# Patient Record
Sex: Female | Born: 1960 | Race: Black or African American | Hispanic: No | Marital: Single | Smoking: Former smoker
Health system: Southern US, Community
[De-identification: ages and names within clinical notes are randomized; demographics above are authoritative.]

## PROBLEM LIST (undated history)

## (undated) DIAGNOSIS — R7303 Prediabetes: Secondary | ICD-10-CM

## (undated) DIAGNOSIS — M1990 Unspecified osteoarthritis, unspecified site: Secondary | ICD-10-CM

## (undated) DIAGNOSIS — Z9289 Personal history of other medical treatment: Secondary | ICD-10-CM

## (undated) DIAGNOSIS — E049 Nontoxic goiter, unspecified: Secondary | ICD-10-CM

## (undated) DIAGNOSIS — E039 Hypothyroidism, unspecified: Secondary | ICD-10-CM

## (undated) DIAGNOSIS — I10 Essential (primary) hypertension: Secondary | ICD-10-CM

## (undated) DIAGNOSIS — K219 Gastro-esophageal reflux disease without esophagitis: Secondary | ICD-10-CM

## (undated) DIAGNOSIS — G4730 Sleep apnea, unspecified: Secondary | ICD-10-CM

## (undated) DIAGNOSIS — F419 Anxiety disorder, unspecified: Secondary | ICD-10-CM

## (undated) DIAGNOSIS — I499 Cardiac arrhythmia, unspecified: Secondary | ICD-10-CM

## (undated) DIAGNOSIS — R0600 Dyspnea, unspecified: Secondary | ICD-10-CM

## (undated) DIAGNOSIS — I509 Heart failure, unspecified: Secondary | ICD-10-CM

## (undated) DIAGNOSIS — E0590 Thyrotoxicosis, unspecified without thyrotoxic crisis or storm: Secondary | ICD-10-CM

## (undated) DIAGNOSIS — R011 Cardiac murmur, unspecified: Secondary | ICD-10-CM

## (undated) DIAGNOSIS — M069 Rheumatoid arthritis, unspecified: Secondary | ICD-10-CM

## (undated) DIAGNOSIS — Z789 Other specified health status: Secondary | ICD-10-CM

## (undated) HISTORY — DX: Cardiac arrhythmia, unspecified: I49.9

## (undated) HISTORY — DX: Rheumatoid arthritis, unspecified: M06.9

## (undated) HISTORY — PX: ENDOMETRIAL ABLATION: SHX621

---

## 2004-10-25 ENCOUNTER — Inpatient Hospital Stay: Payer: Self-pay | Admitting: Family Medicine

## 2005-01-07 ENCOUNTER — Ambulatory Visit: Payer: Self-pay

## 2006-03-25 ENCOUNTER — Ambulatory Visit: Payer: Self-pay | Admitting: Family Medicine

## 2006-10-24 ENCOUNTER — Emergency Department: Payer: Self-pay | Admitting: Emergency Medicine

## 2007-01-04 ENCOUNTER — Emergency Department: Payer: Self-pay | Admitting: Emergency Medicine

## 2007-11-06 ENCOUNTER — Observation Stay: Payer: Self-pay | Admitting: General Surgery

## 2007-11-10 ENCOUNTER — Emergency Department: Payer: Self-pay | Admitting: Emergency Medicine

## 2007-11-14 ENCOUNTER — Observation Stay: Payer: Self-pay | Admitting: General Surgery

## 2007-12-07 ENCOUNTER — Ambulatory Visit: Payer: Self-pay | Admitting: Unknown Physician Specialty

## 2008-01-03 ENCOUNTER — Ambulatory Visit: Payer: Self-pay | Admitting: Unknown Physician Specialty

## 2008-05-17 ENCOUNTER — Emergency Department: Payer: Self-pay | Admitting: Emergency Medicine

## 2008-06-21 ENCOUNTER — Emergency Department: Payer: Self-pay | Admitting: Emergency Medicine

## 2009-04-03 ENCOUNTER — Ambulatory Visit: Payer: Self-pay

## 2010-07-30 ENCOUNTER — Emergency Department: Payer: Self-pay | Admitting: Emergency Medicine

## 2012-09-05 ENCOUNTER — Emergency Department: Payer: Self-pay | Admitting: Emergency Medicine

## 2012-09-05 LAB — CK TOTAL AND CKMB (NOT AT ARMC)
CK, Total: 105 U/L (ref 21–215)
CK-MB: 1.2 ng/mL (ref 0.5–3.6)

## 2012-09-05 LAB — CBC
HGB: 14 g/dL (ref 12.0–16.0)
MCH: 30 pg (ref 26.0–34.0)
Platelet: 220 10*3/uL (ref 150–440)
RBC: 4.69 10*6/uL (ref 3.80–5.20)

## 2012-09-05 LAB — BASIC METABOLIC PANEL
Calcium, Total: 9.7 mg/dL (ref 8.5–10.1)
Chloride: 103 mmol/L (ref 98–107)
Co2: 27 mmol/L (ref 21–32)
EGFR (African American): >60
Osmolality: 272 (ref 275–301)
Sodium: 136 mmol/L (ref 136–145)

## 2012-09-05 LAB — TROPONIN I: Troponin-I: <0.02 ng/mL

## 2013-08-30 ENCOUNTER — Emergency Department: Payer: Self-pay | Admitting: Emergency Medicine

## 2013-08-30 ENCOUNTER — Ambulatory Visit: Payer: Self-pay

## 2013-10-25 ENCOUNTER — Ambulatory Visit: Payer: Self-pay

## 2014-12-02 ENCOUNTER — Emergency Department: Payer: Self-pay | Admitting: Emergency Medicine

## 2016-04-15 ENCOUNTER — Other Ambulatory Visit: Payer: Self-pay | Admitting: Family Medicine

## 2016-04-15 DIAGNOSIS — Z1239 Encounter for other screening for malignant neoplasm of breast: Secondary | ICD-10-CM

## 2016-06-13 ENCOUNTER — Encounter: Payer: Self-pay | Admitting: Emergency Medicine

## 2016-06-13 ENCOUNTER — Emergency Department: Payer: BLUE CROSS/BLUE SHIELD

## 2016-06-13 ENCOUNTER — Emergency Department
Admission: EM | Admit: 2016-06-13 | Discharge: 2016-06-13 | Disposition: A | Discharge status: Home / Self Care | Payer: BLUE CROSS/BLUE SHIELD | Attending: Emergency Medicine | Admitting: Emergency Medicine

## 2016-06-13 DIAGNOSIS — I10 Essential (primary) hypertension: Secondary | ICD-10-CM | POA: Insufficient documentation

## 2016-06-13 DIAGNOSIS — M79605 Pain in left leg: Secondary | ICD-10-CM | POA: Diagnosis present

## 2016-06-13 DIAGNOSIS — M1610 Unilateral primary osteoarthritis, unspecified hip: Secondary | ICD-10-CM

## 2016-06-13 DIAGNOSIS — E119 Type 2 diabetes mellitus without complications: Secondary | ICD-10-CM | POA: Diagnosis not present

## 2016-06-13 DIAGNOSIS — F17200 Nicotine dependence, unspecified, uncomplicated: Secondary | ICD-10-CM | POA: Diagnosis not present

## 2016-06-13 DIAGNOSIS — M169 Osteoarthritis of hip, unspecified: Secondary | ICD-10-CM | POA: Insufficient documentation

## 2016-06-13 HISTORY — DX: Essential (primary) hypertension: I10

## 2016-06-13 MED ORDER — TRAMADOL HCL 50 MG PO TABS
50.0000 mg | ORAL_TABLET | Freq: Four times a day (QID) | ORAL | 0 refills | Status: DC | PRN
Start: 2016-06-13 — End: 2017-04-01

## 2016-06-13 MED ORDER — MELOXICAM 15 MG PO TABS: 15.0000 mg | ORAL_TABLET | Freq: Every day | ORAL | 0 refills | Status: DC

## 2016-06-13 NOTE — ED Triage Notes (Signed)
Reports left hip and leg pain x 1 month.  Ambulates well to triage.

## 2016-06-13 NOTE — ED Notes (Signed)
Pt comes in to ED w/ c/o L thigh pain that radiates to hip and buttocks x1 month that has progressively increased, pain stated 10/10. Per pt pain makes walking difficult. Pt did see orthopedics 8/24 and was prescribed prednisone and muscle relaxer. NAD noted.

## 2016-06-13 NOTE — ED Provider Notes (Signed)
[facility] Emergency Department Provider Note   ____________________________________________   First MD Initiated Contact with Patient 06/13/16 1028     (approximate)  I have reviewed the triage vital signs and the nursing  HPI Alexandria Moore is a 55 y.o. female patient complaining of left thigh pain that radiates to the hip and buttocks times one month. Patient stated pain increases with walking. Patient saw her orthopedic doctor on 06/04/2016 and was prescribed prednisone and muscle relaxants. Patient state no relief with these medications. Patient states she's also been scheduled for physical therapy but has not started the program yet patient states she's discontinue the prednisone on the last ptosis secondary to stomach distress. Patient states she notified the orthopedic doctor about not finishing the last day the medication. Patient rates the pain as a 10 over 10. Patient described the pain as intermittent pain "achy" and sharp. No other palliative measures for this complaint.   Past Medical History:  Diagnosis Date  . Diabetes mellitus without complication (HCC)   . Hypertension     There are no active problems to display for this patient.   History reviewed. No pertinent surgical history.  Prior to Admission medications   Medication Sig Start Date End Date Taking? Authorizing Provider  meloxicam (MOBIC) 15 MG tablet Take 1 tablet (15 mg total) by mouth daily. 06/13/16   Joni Reining, PA-C  traMADol (ULTRAM) 50 MG tablet Take 1 tablet (50 mg total) by mouth every 6 (six) hours as needed. 06/13/16 06/13/17  Joni Reining, PA-C    Allergies Review of patient's allergies indicates no known allergies.  No family history on file.  Social History Social History  Substance Use Topics  . Smoking status: Current Every Day Smoker  . Smokeless tobacco: Never Used  . Alcohol use Not on file    Review of Systems Constitutional: No fever/chills Eyes:  No visual changes. ENT: No sore throat. Cardiovascular: Denies chest pain. Respiratory: Denies shortness of breath. Gastrointestinal: No abdominal pain.  No nausea, no vomiting.  No diarrhea.  No constipation. Genitourinary: Negative for dysuria. Musculoskeletal: Positive for left hip pain Skin: Negative for rash. Neurological: Negative for headaches, focal weakness or numbness.    ____________________________________________   PHYSICAL EXAM:  VITAL SIGNS: ED Triage Vitals  Enc Vitals Group     BP 06/13/16 1005 124/79     Pulse Rate 06/13/16 1005 66     Resp 06/13/16 1005 18     Temp 06/13/16 1005 98 F (36.7 C)     Temp src --      SpO2 06/13/16 1005 99 %     Weight 06/13/16 1007 300 lb (136.1 kg)     Height 06/13/16 1007 5\' 6"  (1.676 m)     Head Circumference --      Peak Flow --      Pain Score 06/13/16 1008 10     Pain Loc --      Pain Edu? --      Excl. in GC? --     Constitutional: Alert and oriented. Well appearing and in no acute distress.Obesity Eyes: Conjunctivae are normal. PERRL. EOMI. Head: Atraumatic. Nose: No congestion/rhinnorhea. Mouth/Throat: Mucous membranes are moist.  Oropharynx non-erythematous. Neck: No stridor.  No cervical spine tenderness to palpation. Hematological/Lymphatic/Immunilogical: No cervical lymphadenopathy. Cardiovascular: Normal rate, regular rhythm. Grossly normal heart sounds.  Good peripheral circulation. Respiratory: Normal respiratory effort.  No retractions. Lungs CTAB. Gastrointestinal: Soft and nontender. No distention. No abdominal  bruits. No CVA tenderness. Musculoskeletal: No obvious deformity to the left hip. No leg length discrepancy. Patient has moderate guarding palpation of the greater trochanter. Patient ambulates with a atypical gait fell favoring the left lower extremity.  Neurologic:  Normal speech and language. No gross focal neurologic deficits are appreciated. No gait instability. Skin:  Skin is warm, dry  and intact. No rash noted. Psychiatric: Mood and affect are normal. Speech and behavior are normal.  ____________________________________________   LABS (all labs ordered are listed, but only abnormal results are displayed)  Labs Reviewed - No data to display ____________________________________________  EKG   ____________________________________________  RADIOLOGY  X-rays showing age advanced degenerative changes bilaterally with the left greater than the right. There is significant joint space narrowing. ____________________________________________   PROCEDURES  Procedure(s) performed: None  Procedures  Critical Care performed: No  ____________________________________________   INITIAL IMPRESSION / ASSESSMENT A ND PLAN / ED COURSE  Pertinent labs & imaging results that were available during my care of the patient were reviewed by me and considered in my medical decision making (see chart for details).  Left hip pain secondary to advanced left hip joint degenerative changes.  Clinical Course     ____________________________________________   FINAL CLINICAL IMPRESSION(S) / ED DIAGNOSES  Final diagnoses:  DJD (degenerative joint disease) of pelvis   Left hip pain secondary to DJD. Discussed x-ray finding with patient. Patient will follow-up with orthopedic department. Definitive evaluation and treatment.   NEW MEDICATIONS STARTED DURING THIS VISIT:  New Prescriptions   MELOXICAM (MOBIC) 15 MG TABLET    Take 1 tablet (15 mg total) by mouth daily.   TRAMADOL (ULTRAM) 50 MG TABLET    Take 1 tablet (50 mg total) by mouth every 6 (six) hours as needed.     Note:  This document was prepared using Dragon voice recognition software and may include unintentional dictation errors.    Joni Reining, PA-C 06/13/16 1117    Governor Rooks, MD 06/13/16 1539

## 2016-10-09 ENCOUNTER — Other Ambulatory Visit: Payer: Self-pay | Admitting: Family Medicine

## 2016-10-09 DIAGNOSIS — Z1239 Encounter for other screening for malignant neoplasm of breast: Secondary | ICD-10-CM

## 2016-11-27 ENCOUNTER — Ambulatory Visit: Payer: BLUE CROSS/BLUE SHIELD

## 2016-12-21 ENCOUNTER — Ambulatory Visit: Payer: BLUE CROSS/BLUE SHIELD

## 2016-12-30 ENCOUNTER — Other Ambulatory Visit: Payer: Self-pay | Admitting: Student

## 2016-12-30 DIAGNOSIS — M1612 Unilateral primary osteoarthritis, left hip: Secondary | ICD-10-CM

## 2017-01-12 ENCOUNTER — Ambulatory Visit
Admission: RE | Admit: 2017-01-12 | Discharge: 2017-01-12 | Disposition: A | Discharge status: Home / Self Care | Payer: BLUE CROSS/BLUE SHIELD | Source: Ambulatory Visit | Attending: Student | Admitting: Student

## 2017-01-12 DIAGNOSIS — M1612 Unilateral primary osteoarthritis, left hip: Secondary | ICD-10-CM

## 2017-01-12 DIAGNOSIS — M79652 Pain in left thigh: Secondary | ICD-10-CM | POA: Insufficient documentation

## 2017-01-12 DIAGNOSIS — M160 Bilateral primary osteoarthritis of hip: Secondary | ICD-10-CM | POA: Diagnosis not present

## 2017-01-20 ENCOUNTER — Inpatient Hospital Stay: Admission: RE | Admit: 2017-01-20 | Payer: BLUE CROSS/BLUE SHIELD | Source: Ambulatory Visit

## 2017-03-05 ENCOUNTER — Ambulatory Visit: Payer: BLUE CROSS/BLUE SHIELD | Attending: Family Medicine

## 2017-04-01 ENCOUNTER — Encounter: Payer: Self-pay | Admitting: Emergency Medicine

## 2017-04-01 ENCOUNTER — Emergency Department
Admission: EM | Admit: 2017-04-01 | Discharge: 2017-04-01 | Disposition: A | Discharge status: Home / Self Care | Payer: BLUE CROSS/BLUE SHIELD | Attending: Emergency Medicine | Admitting: Emergency Medicine

## 2017-04-01 ENCOUNTER — Emergency Department: Payer: BLUE CROSS/BLUE SHIELD

## 2017-04-01 DIAGNOSIS — I10 Essential (primary) hypertension: Secondary | ICD-10-CM | POA: Insufficient documentation

## 2017-04-01 DIAGNOSIS — Z79899 Other long term (current) drug therapy: Secondary | ICD-10-CM | POA: Insufficient documentation

## 2017-04-01 DIAGNOSIS — M19012 Primary osteoarthritis, left shoulder: Secondary | ICD-10-CM | POA: Insufficient documentation

## 2017-04-01 DIAGNOSIS — F17200 Nicotine dependence, unspecified, uncomplicated: Secondary | ICD-10-CM | POA: Insufficient documentation

## 2017-04-01 DIAGNOSIS — E119 Type 2 diabetes mellitus without complications: Secondary | ICD-10-CM | POA: Insufficient documentation

## 2017-04-01 MED ORDER — INDOMETHACIN 25 MG PO CAPS: 25.0000 mg | ORAL_CAPSULE | Freq: Two times a day (BID) | ORAL | 0 refills | Status: DC

## 2017-04-01 NOTE — ED Provider Notes (Signed)
[facility] Emergency Department Provider Note   ____________________________________________   First MD Initiated Contact with Patient 04/01/17 [phone]     (approximate)  I have reviewed the triage vital signs and the nursing notes.   HISTORY  Chief Complaint Shoulder Pain    HPI Alexandria Moore is a 56 y.o. female is here with complaint of left shoulder pain. Patient states that her shoulder and arm have been "sore" for approximately 2 weeks. She denies any injury. She denies any shortness of breath or chest pain. She states that pain is worse with range of motion. She has taken some over-the-counter medication infrequently without any relief of her pain. She denies any previous injury to her shoulder. Currently she rates her pain as a 8/10.   Past Medical History:  Diagnosis Date  . Diabetes mellitus without complication (HCC)   . Hypertension     There are no active problems to display for this patient.   History reviewed. No pertinent surgical history.  Prior to Admission medications   Medication Sig Start Date End Date Taking? Authorizing Provider  amLODipine (NORVASC) 5 MG tablet Take 10 mg by mouth daily.   Yes [provider]  carvedilol (COREG) 25 MG tablet Take 25 mg by mouth 2 (two) times daily with a meal.   Yes [provider]  cloNIDine (CATAPRES) 0.1 MG tablet Take 0.1 mg by mouth 2 (two) times daily.   Yes [provider]  losartan (COZAAR) 25 MG tablet Take 100 mg by mouth daily.   Yes [provider]  indomethacin (INDOCIN) 25 MG capsule Take 1 capsule (25 mg total) by mouth 2 (two) times daily with a meal. 04/01/17   Tommi Rumps, PA-C    Allergies Patient has no known allergies.  No family history on file.  Social History Social History  Substance Use Topics  . Smoking status: Current Every Day Smoker  . Smokeless tobacco: Never Used  . Alcohol use Not on file    Review of  Systems Constitutional: No fever/chills Cardiovascular: Denies chest pain. Respiratory: Denies shortness of breath. Musculoskeletal: Negative for back pain.Positive left shoulder pain. Skin: Negative for rash. Neurological: Negative for headaches, focal weakness or numbness.   ____________________________________________   PHYSICAL EXAM:  VITAL SIGNS: ED Triage Vitals  Enc Vitals Group     BP 04/01/17 0900 (!) 185/97     Pulse Rate 04/01/17 0900 65     Resp 04/01/17 0900 20     Temp --      Temp Source 04/01/17 0900 Oral     SpO2 04/01/17 0900 100 %     Weight 04/01/17 0902 240 lb (108.9 kg)     Height 04/01/17 0902 5\' 4"  (1.626 m)     Head Circumference --      Peak Flow --      Pain Score 04/01/17 0902 8     Pain Loc --      Pain Edu? --      Excl. in GC? --     Constitutional: Alert and oriented. Well appearing and in no acute distress. Eyes: Conjunctivae are normal. PERRL. EOMI. Head: Atraumatic. Neck: No stridor.  No tenderness on palpation cervical spine posteriorly. Range of motion is without restriction. Cardiovascular: Normal rate, regular rhythm. Grossly normal heart sounds.  Good peripheral circulation. Respiratory: Normal respiratory effort.  No retractions. Lungs CTAB. Musculoskeletal: Trapezius muscles on the left shoulder are moderately tender to palpation. There is no gross  deformity and no soft tissue swelling. There is no evidence of injury such as ecchymosis or abrasions. Range of motion of left shoulder is without crepitus. There is no difficulty with range of motion in all 4 planes. Neurologic:  Normal speech and language. No gross focal neurologic deficits are appreciated. No gait instability. Skin:  Skin is warm, dry and intact. No rash noted. Psychiatric: Mood and affect are normal. Speech and behavior are normal.  ____________________________________________   LABS (all labs ordered are listed, but only abnormal results are displayed)  Labs  Reviewed - No data to display  RADIOLOGY  Dg Shoulder Left  Result Date: 04/01/2017 CLINICAL DATA:  Shoulder pain.  No known injury . EXAM: LEFT SHOULDER - 2+ VIEW COMPARISON:  No prior . FINDINGS: Mild acromioclavicular separation cannot be excluded. Acromioclavicular and glenohumeral degenerative change. No acute bony abnormality identified. IMPRESSION: 1. Mild acromioclavicular separation cannot be excluded. 2. Acromioclavicular and glenohumeral degenerative change. Electronically Signed   By: Maisie Fus  Register   On: 04/01/2017 09:59    ____________________________________________   PROCEDURES  Procedure(s) performed: None  Procedures  Critical Care performed: No  ____________________________________________   INITIAL IMPRESSION / ASSESSMENT AND PLAN / ED COURSE  Pertinent labs & imaging results that were available during my care of the patient were reviewed by me and considered in my medical decision making (see chart for details).  Patient has been taking over-the-counter medication and frequently without any relief. Patient was given a prescription for Indocin 25 mg 1 twice a day with food. She may also use ice or heat to her shoulder as needed for discomfort. She is to follow-up with her PCP if any continued problems.   ____________________________________________   FINAL CLINICAL IMPRESSION(S) / ED DIAGNOSES  Final diagnoses:  Osteoarthritis of left shoulder, unspecified osteoarthritis type      NEW MEDICATIONS STARTED DURING THIS VISIT:  Discharge Medication List as of 04/01/2017 10:14 AM    START taking these medications   Details  indomethacin (INDOCIN) 25 MG capsule Take 1 capsule (25 mg total) by mouth 2 (two) times daily with a meal., Starting Thu 04/01/2017, Print         Note:  This document was prepared using Dragon voice recognition software and may include unintentional dictation errors.    Tommi Rumps, PA-C 04/01/17 1444    Governor Rooks, MD 04/01/17 [phone]

## 2017-04-01 NOTE — Discharge Instructions (Signed)
Follow-up with your  primary care provider for recheck of your blood pressure. Continue taking your blood pressure medication daily. Begin taking indomethacin 25 mg twice a day with food. You  may also use ice or heat to your shoulder as needed for comfort.

## 2017-04-01 NOTE — ED Triage Notes (Signed)
presents with pain to left shoulder and arm pain /soreness for about 2 weeks  Denies any injury  No SOB or chest discomfort  No deformity noted

## 2017-08-24 ENCOUNTER — Other Ambulatory Visit: Payer: Self-pay | Admitting: Family Medicine

## 2017-08-24 DIAGNOSIS — Z1231 Encounter for screening mammogram for malignant neoplasm of breast: Secondary | ICD-10-CM

## 2018-02-09 ENCOUNTER — Ambulatory Visit: Payer: 59

## 2018-02-24 ENCOUNTER — Ambulatory Visit: Payer: 59 | Attending: Sports Medicine

## 2018-02-24 ENCOUNTER — Other Ambulatory Visit: Payer: Self-pay

## 2018-02-24 DIAGNOSIS — R2689 Other abnormalities of gait and mobility: Secondary | ICD-10-CM | POA: Insufficient documentation

## 2018-02-24 DIAGNOSIS — M25552 Pain in left hip: Secondary | ICD-10-CM | POA: Insufficient documentation

## 2018-02-24 DIAGNOSIS — M25652 Stiffness of left hip, not elsewhere classified: Secondary | ICD-10-CM | POA: Insufficient documentation

## 2018-02-24 NOTE — Therapy (Addendum)
[facility] MAIN [facility] SERVICES [address] [place], [place], [address] Phone: [phone]   Fax:  [phone]  Physical Therapy Evaluation  Patient Details  Name: Alexandria Moore MRN: [id number] Date of Birth: 23-Feb-1961 Referring Provider: Dorthula Nettles   Encounter Date: 02/24/2018  PT End of Session - 02/25/18 0943    Visit Number  1    Number of Visits  8    Date for PT Re-Evaluation  04/21/18    Authorization - Visit Number  1    Authorization - Number of Visits  10    PT Start Time  1015    PT Stop Time  1106    PT Time Calculation (min)  51 min    Equipment Utilized During Treatment  Gait belt    Activity Tolerance  Patient tolerated treatment well;Patient limited by pain    Behavior During Therapy  [facility] for tasks assessed/performed       Past Medical History:  Diagnosis Date  . Diabetes mellitus without complication (HCC)   . Hypertension    History reviewed. No pertinent surgical history.  There were no vitals filed for this visit.  Subjective Assessment - 03/02/18 1100    Subjective  Patient is a pleasant 57 year old woman who presents for L hip pain.    Pertinent History  Patient is a pleasant 57 year old female who presents to clinic for left hip OA. Patient has had three injections with the last being 4-5 months ago. Pain recently coming back, Now getting cramping on anterior portion of thigh for the past few months. The left hip pain began approximately a year ago, gradually getting worse. Pain hurts every time she walks, leg now gives out. Patient is under high stress from caregiver role to both mother and daughter at this time.     Limitations  Sitting;Lifting;Standing;Walking;House hold activities    How long can you sit comfortably?  n/a    How long can you stand comfortably?  not painful    How long can you walk comfortably?  painful immediately upon standing    Diagnostic tests  Imaging: OA Severe, advanced for  age left hip osteoarthritis. Mild to moderate    Patient Stated Goals  reduce pain, improve walking and transfers    Currently in Pain?  Yes    Pain Score  8     Pain Location  Hip    Pain Orientation  Left    Pain Descriptors / Indicators  Cramping    Pain Type  Chronic pain    Pain Onset  More than a month ago    Pain Frequency  Intermittent    Aggravating Factors   transferring, walking    Pain Relieving Factors  injection        Worst pain: 8-9/10 sharp/bad/cramp  Goal: return to dancing    5x STS =13.5 seconds 10MWT=8.5 seconds LEFS= 20/80  Posture: anterior pelvic tilt with excessive lumbar lordosis  Gait: coxalgic but fluid with trendelenberg  ST: + SLR and FAIR  15x86 ft =1290 ft [facility] PT Assessment - 02/25/18 0001      Assessment   Medical Diagnosis  L hip pain     Referring Provider  Dorthula Nettles    Onset Date/Surgical Date  -- >1 year    Hand Dominance  Right    Next MD Visit  not sure    Prior Therapy  no       Precautions  Precautions  None      Restrictions   Weight Bearing Restrictions  No      Balance Screen   Has the patient fallen in the past 6 months  No    Has the patient had a decrease in activity level because of a fear of falling?   Yes    Is the patient reluctant to leave their home because of a fear of falling?   No      Home Environment   Living Environment  Private residence    Living Arrangements  Alone    Available Help at Discharge  Family    Type of Home  Mobile home    Home Access  Stairs to enter    Entrance Stairs-Number of Steps  4    Entrance Stairs-Rails  Left;Right;Can reach both    Home Layout  One level      Prior Function   Level of Independence  Independent    Vocation  Full time employment    Vocation Requirements  walk all day, drive life coach    Leisure  dance, music       Cognition   Overall Cognitive Status  Within Functional Limits for tasks assessed      Observation/Other Assessments    Observations  decreased weight shift onto L hip    Other Surveys   Other Surveys    Lower Extremity Functional Scale   20/80      Sensation   Light Touch  Appears Intact      Coordination   Gross Motor Movements are Fluid and Coordinated  No    Heel Shin Test  LLE unable to obtain position      Posture/Postural Control   Posture/Postural Control  Postural limitations    Postural Limitations  Anterior pelvic tilt;Increased lumbar lordosis;Weight shift right      ROM / Strength   AROM / PROM / Strength  AROM;PROM;Strength      AROM   Overall AROM   Deficits    AROM Assessment Site  Hip    Right/Left Hip  Left    Left Hip Extension  4    Left Hip Flexion  112    Left Hip External Rotation   29    Left Hip Internal Rotation   15    Left Hip ABduction  -- WFL    Left Hip ADduction  11      PROM   Overall PROM   Deficits    Overall PROM Comments  decreased hip extension. everything else [facility]      Strength   Overall Strength  Deficits    Strength Assessment Site  Hip;Knee;Ankle    Right/Left Hip  Left;Right    Right Hip Flexion  5/5    Right Hip Extension  4/5    Right Hip ABduction  4/5    Right Hip ADduction  4/5    Left Hip Flexion  3+/5 pain    Left Hip Extension  2+/5    Left Hip ABduction  4/5    Left Hip ADduction  4/5    Right/Left Knee  Right;Left    Right Knee Flexion  5/5    Right Knee Extension  5/5    Left Knee Flexion  4-/5    Left Knee Extension  4-/5 pain    Right/Left Ankle  Right;Left    Right Ankle Dorsiflexion  5/5    Right Ankle Plantar Flexion  5/5  Left Ankle Dorsiflexion  5/5    Left Ankle Plantar Flexion  5/5      Flexibility   Soft Tissue Assessment /Muscle Length  yes    Quadriceps  L tight and painful      Palpation   Palpation comment  Hip PA mobilizations hypomobile nonpainful, AP painful w hard EF      Special Tests    Special Tests  Hip Special Tests    Other special tests  -- - FABER, +FAIR, -Axial hip Load, + SLR      Bed  Mobility   Bed Mobility  Rolling Right;Rolling Left;Supine to Sit;Sit to Supine    Rolling Right  7: Independent    Rolling Left  7: Independent    Supine to Sit  7: Independent    Sit to Supine  7: Independent      Transfers   Transfers  Sit to Stand;Stand to Sit    Sit to Stand  6: Modified independent (Device/Increase time)    Five time sit to stand comments   13.5 sec, decreased weight shift to LLE    Stand to Sit  6: Modified independent (Device/Increase time)      Ambulation/Gait   Ambulation/Gait  Yes    Ambulation/Gait Assistance  7: Independent    Ambulation Distance (Feet)  1290 Feet    Assistive device  None    Gait Pattern  Decreased stance time - left;Decreased weight shift to left;Trendelenburg    Ambulation Surface  Level;Indoor    Gait velocity  1.25m/s      6 minute walk test results    Aerobic Endurance Distance Walked  1290         Access Code: [email or web address]  URL: [email or web address]  Date: 02/25/2018  Prepared by: Precious Bard   Exercises  Supine Hip Flexor Stretch with Weight - 2 reps - 2 sets - 30 hold - 1x daily - 7x weekly  Standing Hip Flexor Stretch - 2 reps - 2 sets - 30 hold - 1x daily - 7x weekly  Standing Hip Extension - 10 reps - 2 sets - 5 hold - 1x daily - 7x weekly  Standing Knee Flexion - 10 reps - 2 sets - 5 hold - 1x daily - 7x weekly  Standing Knee Flexion - 10 reps - 2 sets - 5 hold - 1x daily - 7x weekly        Objective measurements completed on examination: See above findings.       Patient will benefit from skilled therapeutic intervention in order to improve the following deficits and impairments:  Abnormal gait, Decreased activity tolerance, Decreased coordination, Decreased endurance, Decreased range of motion, Decreased mobility, Decreased strength, Difficulty walking, Hypomobility, Impaired flexibility, Impaired perceived functional ability, Improper body mechanics, Postural dysfunction, Pain  Visit  Diagnosis: Pain in left hip  Stiffness of left hip, not elsewhere classified  Other abnormalities of gait and mobility   Plan - 03/02/18 1108    Clinical Impression Statement  Patient is a pleasant 57 year old woman who presents for L hip pain. Tight anterior musculature (especially rectus femoris) results in spasming pain with ambulation and decreases with gentle prolonged stretching. Decreased extensor musculature strength noted with minimal available range of motion. Noted anterior pelvic tilt with increased lumbar lordosis combined with decreased weight shift onto LLE. 5x STS= 13.5 seconds with decreased weight shift to LLE, 10 MWT=8.5 seconds, LEFS 20/80, 6 min walk test 1290 ft. Positive SLR and FAIR test potentially  due to tight rectus femoris resulting in pain upon contraction and lengthening. Trendelenburg gait with decreased hip extension of LLE noted with ambulation, initially worse, improves as movement continues then peaks and begins to regress as fatigue sets in. Patient will benefit from skilled physical therapy to decrease pain and improve mobility to return to PLOF.     History and Personal Factors relevant to plan of care:  This patient presents with, 3, personal factors/ comorbidities and 1-2 body elements including body structures and functions, activity limitations and or participation restrictions. Patient's condition is evolving.     Clinical Presentation  Evolving    Clinical Presentation due to:  pain initially in hip, progressing to anterior portion of thigh in past 3 months    Rehab Potential  Fair    Clinical Impairments Affecting Rehab Potential  (+) motivation, age, recent weight loss (-) high stress levels in personal life, chronicity of hip pain    PT Frequency  1x / week    PT Duration  8 weeks    PT Treatment/Interventions  ADLs/Self Care Home Management;Cryotherapy;Aquatic Therapy;Electrical Stimulation;Iontophoresis 4mg /ml Dexamethasone;Moist  Heat;Ultrasound;Traction;Functional mobility training;Stair training;Gait training;DME Instruction;Therapeutic activities;Therapeutic exercise;Balance training;Neuromuscular re-education;Patient/family education;Manual techniques;Passive range of motion;Taping;Energy conservation;Dry needling    PT Next Visit Plan  review HEP, stretch quads, strenghten extensors, distraction of LLE     PT Home Exercise Plan  see sheet    Consulted and Agree with Plan of Care  Patient        Problem List There are no active problems to display for this patient. , PT, DPT   02/25/2018, 10:00 AM   [facility] MAIN [facility] SERVICES [address] [place], [place], [place] Phone: [phone]   Fax:  [phone]  Name: Alexandria Moore MRN: [id number] Date of Birth: [birth year]

## 2018-02-25 NOTE — Patient Instructions (Signed)
Access Code: [email or web address]  URL: [email or web address]  Date: 02/25/2018  Prepared by: Precious Bard   Exercises  Supine Hip Flexor Stretch with Weight - 2 reps - 2 sets - 30 hold - 1x daily - 7x weekly  Standing Hip Flexor Stretch - 2 reps - 2 sets - 30 hold - 1x daily - 7x weekly  Standing Hip Extension - 10 reps - 2 sets - 5 hold - 1x daily - 7x weekly  Standing Knee Flexion - 10 reps - 2 sets - 5 hold - 1x daily - 7x weekly  Standing Knee Flexion - 10 reps - 2 sets - 5 hold - 1x daily - 7x weekly

## 2018-03-08 ENCOUNTER — Ambulatory Visit: Payer: 59

## 2018-03-15 ENCOUNTER — Ambulatory Visit: Payer: 59 | Attending: Sports Medicine

## 2018-03-22 ENCOUNTER — Ambulatory Visit: Payer: 59

## 2018-04-20 DIAGNOSIS — Z6841 Body Mass Index (BMI) 40.0 and over, adult: Secondary | ICD-10-CM | POA: Insufficient documentation

## 2018-05-04 ENCOUNTER — Encounter
Admission: RE | Admit: 2018-05-04 | Discharge: 2018-05-04 | Disposition: A | Discharge status: Home / Self Care | Payer: 59 | Source: Ambulatory Visit | Attending: Orthopedic Surgery | Admitting: Orthopedic Surgery

## 2018-05-04 ENCOUNTER — Other Ambulatory Visit: Payer: Self-pay

## 2018-05-04 DIAGNOSIS — R9431 Abnormal electrocardiogram [ECG] [EKG]: Secondary | ICD-10-CM | POA: Diagnosis not present

## 2018-05-04 DIAGNOSIS — Z01812 Encounter for preprocedural laboratory examination: Secondary | ICD-10-CM | POA: Insufficient documentation

## 2018-05-04 DIAGNOSIS — Z01818 Encounter for other preprocedural examination: Secondary | ICD-10-CM | POA: Insufficient documentation

## 2018-05-04 DIAGNOSIS — Z0183 Encounter for blood typing: Secondary | ICD-10-CM | POA: Insufficient documentation

## 2018-05-04 DIAGNOSIS — I10 Essential (primary) hypertension: Secondary | ICD-10-CM | POA: Insufficient documentation

## 2018-05-04 HISTORY — DX: Personal history of other medical treatment: Z92.89

## 2018-05-04 HISTORY — DX: Dyspnea, unspecified: R06.00

## 2018-05-04 HISTORY — DX: Cardiac murmur, unspecified: R01.1

## 2018-05-04 HISTORY — DX: Heart failure, unspecified: I50.9

## 2018-05-04 HISTORY — DX: Sleep apnea, unspecified: G47.30

## 2018-05-04 HISTORY — DX: Gastro-esophageal reflux disease without esophagitis: K21.9

## 2018-05-04 HISTORY — DX: Unspecified osteoarthritis, unspecified site: M19.90

## 2018-05-04 HISTORY — DX: Hypothyroidism, unspecified: E03.9

## 2018-05-04 LAB — SURGICAL PCR SCREEN
MRSA, PCR: NEGATIVE
Staphylococcus aureus: POSITIVE — AB

## 2018-05-04 LAB — BASIC METABOLIC PANEL
ANION GAP: 6 (ref 5–15)
BUN: 26 mg/dL — ABNORMAL HIGH (ref 6–20)
CALCIUM: 9.3 mg/dL (ref 8.9–10.3)
CO2: 26 mmol/L (ref 22–32)
Chloride: 107 mmol/L (ref 98–111)
Creatinine, Ser: 1 mg/dL (ref 0.44–1.00)
GFR calc Af Amer: >60 mL/min (ref >60)
GLUCOSE: 105 mg/dL — AB (ref 70–99)
POTASSIUM: 3.7 mmol/L (ref 3.5–5.1)
SODIUM: 139 mmol/L (ref 135–145)

## 2018-05-04 LAB — CBC WITH DIFFERENTIAL/PLATELET
BASOS ABS: 0 10*3/uL (ref 0–0.1)
BASOS PCT: 1 %
EOS ABS: 0.2 10*3/uL (ref 0–0.7)
EOS PCT: 3 %
HCT: 37.3 % (ref 35.0–47.0)
Hemoglobin: 12.7 g/dL (ref 12.0–16.0)
LYMPHS PCT: 17 %
Lymphs Abs: 1 10*3/uL (ref 1.0–3.6)
MCH: 31.9 pg (ref 26.0–34.0)
MCHC: 34.1 g/dL (ref 32.0–36.0)
MCV: 93.4 fL (ref 80.0–100.0)
Monocytes Absolute: 0.4 10*3/uL (ref 0.2–0.9)
Monocytes Relative: 7 %
Neutro Abs: 4.2 10*3/uL (ref 1.4–6.5)
Neutrophils Relative %: 72 %
PLATELETS: 245 10*3/uL (ref 150–440)
RBC: 3.99 MIL/uL (ref 3.80–5.20)
RDW: 15.4 % — ABNORMAL HIGH (ref 11.5–14.5)
WBC: 5.7 10*3/uL (ref 3.6–11.0)

## 2018-05-04 LAB — URINALYSIS, ROUTINE W REFLEX MICROSCOPIC
BILIRUBIN URINE: NEGATIVE
GLUCOSE, UA: NEGATIVE mg/dL
HGB URINE DIPSTICK: NEGATIVE
KETONES UR: NEGATIVE mg/dL
Leukocytes, UA: NEGATIVE
Nitrite: NEGATIVE
PROTEIN: NEGATIVE mg/dL
Specific Gravity, Urine: 1.025 (ref 1.005–1.030)
pH: 5 (ref 5.0–8.0)

## 2018-05-04 LAB — APTT: aPTT: 30 seconds (ref 24–36)

## 2018-05-04 LAB — SEDIMENTATION RATE: Sed Rate: 2 mm/hr (ref 0–30)

## 2018-05-04 LAB — PROTIME-INR
INR: 0.94
Prothrombin Time: 12.5 seconds (ref 11.4–15.2)

## 2018-05-04 LAB — TYPE AND SCREEN
ABO/RH(D): A POS
ANTIBODY SCREEN: NEGATIVE

## 2018-05-04 NOTE — Pre-Procedure Instructions (Addendum)
FAXED POSITIVE STAPH TO DR [facility] EKG COMPARED WITH 2016

## 2018-05-04 NOTE — Patient Instructions (Signed)
Your procedure is scheduled on: Thursday, May 12, 2018  Report to THE SECOND FLOOR OF THE SAME DAY SURGERY IN THE MEDICAL MALL  DO NOT STOP ON THE FIRST FLOOR TO REGISTER  To find out your arrival time please call [phone] between 1PM - 3PM on Wednesday, May 11, 2018  Remember: Instructions that are not followed completely may result in serious medical risk,  up to and including death, or upon the discretion of your surgeon and anesthesiologist your  surgery may need to be rescheduled.     _X__ 1. Do not eat food after midnight the night before your procedure.                 No gum chewing or hard candies. NOTHING SOLID IN YOUR MOUTH AFTER MIDNIGHT                  You may drink clear liquids up to 2 hours before you are scheduled to arrive for your surgery-                   DO not drink clear liquids within 2 hours of the start of your surgery.                  Clear Liquids include:  water, apple juice without pulp, clear carbohydrate                 drink such as Clearfast of Gatorade, Black Coffee or Tea (Do not add                 anything to coffee or tea).  __X__2.  On the morning of surgery brush your teeth with toothpaste and water,                    You may rinse your mouth with mouthwash if you wish.                       Do not swallow any toothpaste of mouthwash.     _X__ 3.  No Alcohol for 24 hours before or after surgery.   _X__ 4.  Do Not Smoke or use e-cigarettes For 24 Hours Prior to Your Surgery.                 Do not use any chewable tobacco products for at least 6 hours prior to                 surgery.  ____  5.  Bring all medications with you on the day of surgery if instructed.   __X__  6.  Notify your doctor if there is any change in your medical condition      (cold, fever, infections).     Do not wear jewelry, make-up, hairpins, clips or nail polish. Do not wear lotions, powders, or perfumes. You may wear  deodorant. Do not shave 48 hours prior to surgery. Men may shave face and neck. Do not bring valuables to the hospital.    [facility] is not responsible for any belongings or valuables.  Contacts, dentures or bridgework may not be worn into surgery. Leave your suitcase in the car. After surgery it may be brought to your room. For patients admitted to the hospital, discharge time is determined by your treatment team.   Patients discharged the day of surgery will not be allowed to drive home.   Please read over the  following fact sheets that you were given:                      PREPARING FOR SURGERY                      MRSA:STOP THE SPREAD   _X___ Take these medicines the morning of surgery with A SIP OF WATER:    1. AMLODIPINE  2. CLONIDINE  3. OMEPRAZOLE, IF YOU HAVE SOME  4. PERCOCET, IF NEED TO TAKE YOU CAN  5.  6.  ____ Fleet Enema (as directed)   __X__ Use CHG Soap as directed  _X___ Stop ALL ASPIRIN PRODUCTS TODAY               THIS INCLUDES EXCEDRIN / BC POWDERS / GOODIES POWDERS  __X__ Stop Anti-inflammatories AS OF TODAY               THIS INCLUDES IBUPROFEN / MOTRIN / ADVIL / ALEVE               YOU MAY TAKE TYLENOL AT ANY TIME PRIOR TO SURGERY  ____ Stop supplements until after surgery.    ____ Bring C-Pap to the hospital.   HAVE ANY TYPE OF STOOL SOFTENER TO USE AT HOME WHILE TAKING NARCOTICS.  WEAR STABLE SHOES WITH YOU FOR THE HOSPITAL STAY.   DO NOT TAKE LOSARTAN ON THE DAY OF SURGERY BUT DO CONTINUE TO TAKE IT UP UNTIL THEN

## 2018-05-05 LAB — URINE CULTURE: CULTURE: NO GROWTH

## 2018-05-11 MED ORDER — CEFAZOLIN SODIUM-DEXTROSE 2-4 GM/100ML-% IV SOLN
2.0000 g | Freq: Once | INTRAVENOUS | Status: AC
  Administered 2018-05-12: 2 g via INTRAVENOUS

## 2018-05-11 MED ORDER — TRANEXAMIC ACID 1000 MG/10ML IV SOLN
1000.0000 mg | INTRAVENOUS | Status: AC
  Administered 2018-05-12: 1000 mg via INTRAVENOUS
  Filled 2018-05-11: qty 1100

## 2018-05-12 ENCOUNTER — Other Ambulatory Visit: Payer: Self-pay

## 2018-05-12 ENCOUNTER — Encounter
Admission: RE | Disposition: A | Discharge status: Home Health | Payer: Self-pay | Source: Home / Self Care | Attending: Orthopedic Surgery

## 2018-05-12 ENCOUNTER — Inpatient Hospital Stay: Payer: Commercial Managed Care - HMO

## 2018-05-12 ENCOUNTER — Inpatient Hospital Stay
Admission: RE | Admit: 2018-05-12 | Discharge: 2018-05-14 | DRG: 470 | Disposition: A | Discharge status: Home Health | Payer: Commercial Managed Care - HMO | Attending: Orthopedic Surgery | Admitting: Orthopedic Surgery

## 2018-05-12 ENCOUNTER — Encounter: Payer: Self-pay | Admitting: *Deleted

## 2018-05-12 DIAGNOSIS — Z6841 Body Mass Index (BMI) 40.0 and over, adult: Secondary | ICD-10-CM

## 2018-05-12 DIAGNOSIS — Z7982 Long term (current) use of aspirin: Secondary | ICD-10-CM | POA: Diagnosis not present

## 2018-05-12 DIAGNOSIS — I509 Heart failure, unspecified: Secondary | ICD-10-CM | POA: Diagnosis present

## 2018-05-12 DIAGNOSIS — G8918 Other acute postprocedural pain: Secondary | ICD-10-CM

## 2018-05-12 DIAGNOSIS — Z419 Encounter for procedure for purposes other than remedying health state, unspecified: Secondary | ICD-10-CM

## 2018-05-12 DIAGNOSIS — Z87891 Personal history of nicotine dependence: Secondary | ICD-10-CM | POA: Diagnosis not present

## 2018-05-12 DIAGNOSIS — I110 Hypertensive heart disease with heart failure: Secondary | ICD-10-CM | POA: Diagnosis present

## 2018-05-12 DIAGNOSIS — M1612 Unilateral primary osteoarthritis, left hip: Principal | ICD-10-CM | POA: Diagnosis present

## 2018-05-12 DIAGNOSIS — M25552 Pain in left hip: Secondary | ICD-10-CM | POA: Diagnosis present

## 2018-05-12 DIAGNOSIS — D62 Acute posthemorrhagic anemia: Secondary | ICD-10-CM | POA: Diagnosis not present

## 2018-05-12 DIAGNOSIS — K219 Gastro-esophageal reflux disease without esophagitis: Secondary | ICD-10-CM | POA: Diagnosis present

## 2018-05-12 DIAGNOSIS — Z96642 Presence of left artificial hip joint: Secondary | ICD-10-CM

## 2018-05-12 HISTORY — PX: TOTAL HIP ARTHROPLASTY: SHX124

## 2018-05-12 LAB — ABO/RH: ABO/RH(D): A POS

## 2018-05-12 SURGERY — ARTHROPLASTY, HIP, TOTAL, ANTERIOR APPROACH
Anesthesia: Spinal | Site: Hip | Laterality: Left | Wound class: Clean

## 2018-05-12 MED ORDER — ACETAMINOPHEN 10 MG/ML IV SOLN
INTRAVENOUS | Status: DC | PRN
  Administered 2018-05-12: 1000 mg via INTRAVENOUS

## 2018-05-12 MED ORDER — METHOCARBAMOL 1000 MG/10ML IJ SOLN
500.0000 mg | Freq: Four times a day (QID) | INTRAVENOUS | Status: DC | PRN
  Filled 2018-05-12: qty 5

## 2018-05-12 MED ORDER — EPHEDRINE SULFATE 50 MG/ML IJ SOLN
INTRAMUSCULAR | Status: DC | PRN
  Administered 2018-05-12: 5 mg via INTRAVENOUS

## 2018-05-12 MED ORDER — FENTANYL CITRATE (PF) 100 MCG/2ML IJ SOLN
25.0000 ug | INTRAMUSCULAR | Status: DC | PRN
  Administered 2018-05-12: 25 ug via INTRAVENOUS

## 2018-05-12 MED ORDER — BUPIVACAINE HCL (PF) 0.5 % IJ SOLN
INTRAMUSCULAR | Status: DC | PRN
  Administered 2018-05-12: 3 mL via INTRATHECAL

## 2018-05-12 MED ORDER — EPHEDRINE SULFATE 50 MG/ML IJ SOLN
INTRAMUSCULAR | Status: AC
  Filled 2018-05-12: qty 1

## 2018-05-12 MED ORDER — ACETAMINOPHEN 10 MG/ML IV SOLN
INTRAVENOUS | Status: AC
  Filled 2018-05-12: qty 100

## 2018-05-12 MED ORDER — METOCLOPRAMIDE HCL 10 MG PO TABS: 5.0000 mg | ORAL_TABLET | Freq: Three times a day (TID) | ORAL | Status: DC | PRN

## 2018-05-12 MED ORDER — SODIUM CHLORIDE 0.9 % IV SOLN
INTRAVENOUS | Status: DC | PRN
  Administered 2018-05-12: 20 ug/min via INTRAVENOUS

## 2018-05-12 MED ORDER — MORPHINE SULFATE (PF) 2 MG/ML IV SOLN
2.0000 mg | INTRAVENOUS | Status: DC | PRN
  Administered 2018-05-12 (×2): 2 mg via INTRAVENOUS
  Filled 2018-05-12 (×2): qty 1

## 2018-05-12 MED ORDER — PANTOPRAZOLE SODIUM 40 MG PO TBEC
40.0000 mg | DELAYED_RELEASE_TABLET | Freq: Every day | ORAL | Status: DC
  Administered 2018-05-13 – 2018-05-14 (×2): 40 mg via ORAL
  Filled 2018-05-12 (×2): qty 1

## 2018-05-12 MED ORDER — ALUM & MAG HYDROXIDE-SIMETH 200-200-20 MG/5ML PO SUSP: 30.0000 mL | ORAL | Status: DC | PRN

## 2018-05-12 MED ORDER — BUPIVACAINE-EPINEPHRINE 0.25% -1:200000 IJ SOLN
INTRAMUSCULAR | Status: DC | PRN
  Administered 2018-05-12: 30 mL

## 2018-05-12 MED ORDER — BUPIVACAINE-EPINEPHRINE (PF) 0.25% -1:200000 IJ SOLN
INTRAMUSCULAR | Status: AC
  Filled 2018-05-12: qty 30

## 2018-05-12 MED ORDER — NEOMYCIN-POLYMYXIN B GU 40-200000 IR SOLN
Status: AC
  Filled 2018-05-12: qty 4

## 2018-05-12 MED ORDER — CARVEDILOL 25 MG PO TABS
25.0000 mg | ORAL_TABLET | Freq: Two times a day (BID) | ORAL | Status: DC
  Administered 2018-05-12 – 2018-05-14 (×3): 25 mg via ORAL
  Filled 2018-05-12 (×4): qty 1

## 2018-05-12 MED ORDER — FENTANYL CITRATE (PF) 100 MCG/2ML IJ SOLN
INTRAMUSCULAR | Status: AC
  Filled 2018-05-12: qty 2

## 2018-05-12 MED ORDER — ACETAMINOPHEN 325 MG PO TABS: 325.0000 mg | ORAL_TABLET | Freq: Four times a day (QID) | ORAL | Status: DC | PRN

## 2018-05-12 MED ORDER — ACYCLOVIR 5 % EX OINT
1.0000 "application " | TOPICAL_OINTMENT | CUTANEOUS | Status: DC | PRN
  Filled 2018-05-12: qty 15

## 2018-05-12 MED ORDER — SODIUM CHLORIDE 0.9 % IV SOLN
INTRAVENOUS | Status: DC
  Administered 2018-05-12: 12:00:00 via INTRAVENOUS

## 2018-05-12 MED ORDER — LIDOCAINE HCL (PF) 2 % IJ SOLN
INTRAMUSCULAR | Status: AC
  Filled 2018-05-12: qty 10

## 2018-05-12 MED ORDER — NEOMYCIN-POLYMYXIN B GU 40-200000 IR SOLN
Status: DC | PRN
  Administered 2018-05-12: 4 mL

## 2018-05-12 MED ORDER — TRIAMCINOLONE ACETONIDE 0.025 % EX CREA
TOPICAL_CREAM | Freq: Two times a day (BID) | CUTANEOUS | Status: DC | PRN
  Filled 2018-05-12: qty 15

## 2018-05-12 MED ORDER — ONDANSETRON HCL 4 MG/2ML IJ SOLN
4.0000 mg | Freq: Four times a day (QID) | INTRAMUSCULAR | Status: DC | PRN
  Administered 2018-05-13: 4 mg via INTRAVENOUS
  Filled 2018-05-12: qty 2

## 2018-05-12 MED ORDER — ONDANSETRON HCL 4 MG PO TABS: 4.0000 mg | ORAL_TABLET | Freq: Four times a day (QID) | ORAL | Status: DC | PRN

## 2018-05-12 MED ORDER — ASPIRIN EC 325 MG PO TBEC
325.0000 mg | DELAYED_RELEASE_TABLET | Freq: Every day | ORAL | Status: DC
  Administered 2018-05-13 – 2018-05-14 (×2): 325 mg via ORAL
  Filled 2018-05-12 (×2): qty 1

## 2018-05-12 MED ORDER — PHENYLEPHRINE HCL 10 MG/ML IJ SOLN
INTRAMUSCULAR | Status: DC | PRN
  Administered 2018-05-12: 50 ug via INTRAVENOUS
  Administered 2018-05-12: 100 ug via INTRAVENOUS
  Administered 2018-05-12 (×2): 50 ug via INTRAVENOUS
  Administered 2018-05-12 (×2): 100 ug via INTRAVENOUS
  Administered 2018-05-12: 50 ug via INTRAVENOUS

## 2018-05-12 MED ORDER — ONDANSETRON HCL 4 MG/2ML IJ SOLN
INTRAMUSCULAR | Status: AC
  Filled 2018-05-12: qty 2

## 2018-05-12 MED ORDER — BISACODYL 5 MG PO TBEC
5.0000 mg | DELAYED_RELEASE_TABLET | Freq: Every day | ORAL | Status: DC | PRN
Start: 2018-05-12 — End: 2018-05-14
  Administered 2018-05-13 – 2018-05-14 (×2): 5 mg via ORAL
  Filled 2018-05-12 (×2): qty 1

## 2018-05-12 MED ORDER — PROPOFOL 10 MG/ML IV BOLUS
INTRAVENOUS | Status: AC
  Filled 2018-05-12: qty 20

## 2018-05-12 MED ORDER — METOCLOPRAMIDE HCL 5 MG/ML IJ SOLN
5.0000 mg | Freq: Three times a day (TID) | INTRAMUSCULAR | Status: DC | PRN
  Administered 2018-05-13: 10 mg via INTRAVENOUS
  Filled 2018-05-12: qty 2

## 2018-05-12 MED ORDER — MIDAZOLAM HCL 5 MG/5ML IJ SOLN
INTRAMUSCULAR | Status: DC | PRN
  Administered 2018-05-12: 2 mg via INTRAVENOUS

## 2018-05-12 MED ORDER — PHENYLEPHRINE HCL 10 MG/ML IJ SOLN
INTRAMUSCULAR | Status: AC
  Filled 2018-05-12: qty 1

## 2018-05-12 MED ORDER — PROPOFOL 500 MG/50ML IV EMUL
INTRAVENOUS | Status: AC
  Filled 2018-05-12: qty 50

## 2018-05-12 MED ORDER — FENTANYL CITRATE (PF) 100 MCG/2ML IJ SOLN
INTRAMUSCULAR | Status: DC | PRN
  Administered 2018-05-12 (×2): 50 ug via INTRAVENOUS

## 2018-05-12 MED ORDER — PROPOFOL 10 MG/ML IV BOLUS
INTRAVENOUS | Status: DC | PRN
  Administered 2018-05-12: 20 mg via INTRAVENOUS

## 2018-05-12 MED ORDER — MAGNESIUM CITRATE PO SOLN
1.0000 | Freq: Once | ORAL | Status: DC | PRN
  Filled 2018-05-12: qty 296

## 2018-05-12 MED ORDER — CLONIDINE HCL 0.1 MG PO TABS
0.1000 mg | ORAL_TABLET | Freq: Two times a day (BID) | ORAL | Status: DC
  Administered 2018-05-12 – 2018-05-14 (×3): 0.1 mg via ORAL
  Filled 2018-05-12 (×3): qty 1

## 2018-05-12 MED ORDER — DIPHENHYDRAMINE HCL 12.5 MG/5ML PO ELIX: 12.5000 mg | ORAL_SOLUTION | ORAL | Status: DC | PRN

## 2018-05-12 MED ORDER — CEFAZOLIN SODIUM-DEXTROSE 2-4 GM/100ML-% IV SOLN
INTRAVENOUS | Status: AC
  Filled 2018-05-12: qty 100

## 2018-05-12 MED ORDER — AMLODIPINE BESYLATE 10 MG PO TABS
10.0000 mg | ORAL_TABLET | Freq: Every day | ORAL | Status: DC
  Administered 2018-05-13 – 2018-05-14 (×2): 10 mg via ORAL
  Filled 2018-05-12 (×2): qty 1

## 2018-05-12 MED ORDER — OXYCODONE HCL 5 MG PO TABS
10.0000 mg | ORAL_TABLET | ORAL | Status: DC | PRN
  Administered 2018-05-13: 10 mg via ORAL
  Filled 2018-05-12: qty 2

## 2018-05-12 MED ORDER — ACETAMINOPHEN 500 MG PO TABS
1000.0000 mg | ORAL_TABLET | Freq: Four times a day (QID) | ORAL | Status: AC
  Administered 2018-05-12 (×3): 1000 mg via ORAL
  Filled 2018-05-12 (×4): qty 2

## 2018-05-12 MED ORDER — OXYCODONE HCL 5 MG PO TABS
5.0000 mg | ORAL_TABLET | ORAL | Status: DC | PRN
  Administered 2018-05-12 (×2): 10 mg via ORAL
  Administered 2018-05-12 – 2018-05-13 (×2): 5 mg via ORAL
  Administered 2018-05-13: 10 mg via ORAL
  Administered 2018-05-13: 5 mg via ORAL
  Administered 2018-05-14: 10 mg via ORAL
  Filled 2018-05-12: qty 1
  Filled 2018-05-12: qty 2
  Filled 2018-05-12: qty 1
  Filled 2018-05-12 (×3): qty 2
  Filled 2018-05-12: qty 1

## 2018-05-12 MED ORDER — DEXAMETHASONE SODIUM PHOSPHATE 10 MG/ML IJ SOLN
INTRAMUSCULAR | Status: DC | PRN
  Administered 2018-05-12: 10 mg via INTRAVENOUS

## 2018-05-12 MED ORDER — MIDAZOLAM HCL 2 MG/2ML IJ SOLN
INTRAMUSCULAR | Status: AC
  Filled 2018-05-12: qty 2

## 2018-05-12 MED ORDER — GABAPENTIN 300 MG PO CAPS
300.0000 mg | ORAL_CAPSULE | Freq: Three times a day (TID) | ORAL | Status: DC
  Administered 2018-05-12 – 2018-05-14 (×7): 300 mg via ORAL
  Filled 2018-05-12 (×7): qty 1

## 2018-05-12 MED ORDER — ZOLPIDEM TARTRATE 5 MG PO TABS: 5.0000 mg | ORAL_TABLET | Freq: Every evening | ORAL | Status: DC | PRN

## 2018-05-12 MED ORDER — PHENOL 1.4 % MT LIQD
1.0000 | OROMUCOSAL | Status: DC | PRN
  Filled 2018-05-12: qty 177

## 2018-05-12 MED ORDER — MENTHOL 3 MG MT LOZG
1.0000 | LOZENGE | OROMUCOSAL | Status: DC | PRN
  Filled 2018-05-12: qty 9

## 2018-05-12 MED ORDER — DOCUSATE SODIUM 100 MG PO CAPS
100.0000 mg | ORAL_CAPSULE | Freq: Two times a day (BID) | ORAL | Status: DC
  Administered 2018-05-12 – 2018-05-14 (×5): 100 mg via ORAL
  Filled 2018-05-12 (×5): qty 1

## 2018-05-12 MED ORDER — BUPIVACAINE HCL (PF) 0.5 % IJ SOLN
INTRAMUSCULAR | Status: AC
  Filled 2018-05-12: qty 10

## 2018-05-12 MED ORDER — LIDOCAINE HCL (PF) 2 % IJ SOLN
INTRAMUSCULAR | Status: DC | PRN
  Administered 2018-05-12: 60 mg

## 2018-05-12 MED ORDER — LACTATED RINGERS IV SOLN
INTRAVENOUS | Status: DC
  Administered 2018-05-12: 06:00:00 via INTRAVENOUS

## 2018-05-12 MED ORDER — TRAMADOL HCL 50 MG PO TABS
50.0000 mg | ORAL_TABLET | Freq: Four times a day (QID) | ORAL | Status: DC
  Administered 2018-05-12 – 2018-05-14 (×9): 50 mg via ORAL
  Filled 2018-05-12 (×9): qty 1

## 2018-05-12 MED ORDER — METHOCARBAMOL 500 MG PO TABS
500.0000 mg | ORAL_TABLET | Freq: Four times a day (QID) | ORAL | Status: DC | PRN
  Administered 2018-05-13: 500 mg via ORAL
  Filled 2018-05-12: qty 1

## 2018-05-12 MED ORDER — GLYCOPYRROLATE 0.2 MG/ML IJ SOLN
INTRAMUSCULAR | Status: AC
  Filled 2018-05-12: qty 1

## 2018-05-12 MED ORDER — ONDANSETRON HCL 4 MG/2ML IJ SOLN: 4.0000 mg | Freq: Once | INTRAMUSCULAR | Status: DC | PRN

## 2018-05-12 MED ORDER — BUPIVACAINE HCL (PF) 0.5 % IJ SOLN
INTRAMUSCULAR | Status: AC
  Filled 2018-05-12: qty 30

## 2018-05-12 MED ORDER — GLYCOPYRROLATE 0.2 MG/ML IJ SOLN
INTRAMUSCULAR | Status: DC | PRN
  Administered 2018-05-12: 0.2 mg via INTRAVENOUS

## 2018-05-12 MED ORDER — PROPOFOL 500 MG/50ML IV EMUL
INTRAVENOUS | Status: DC | PRN
  Administered 2018-05-12: 100 ug/kg/min via INTRAVENOUS

## 2018-05-12 MED ORDER — SENNOSIDES-DOCUSATE SODIUM 8.6-50 MG PO TABS: 1.0000 | ORAL_TABLET | Freq: Every evening | ORAL | Status: DC | PRN

## 2018-05-12 MED ORDER — LOSARTAN POTASSIUM 50 MG PO TABS
100.0000 mg | ORAL_TABLET | Freq: Every day | ORAL | Status: DC
  Administered 2018-05-12 – 2018-05-14 (×2): 100 mg via ORAL
  Filled 2018-05-12 (×2): qty 2

## 2018-05-12 MED ORDER — DEXAMETHASONE SODIUM PHOSPHATE 10 MG/ML IJ SOLN
INTRAMUSCULAR | Status: AC
  Filled 2018-05-12: qty 1

## 2018-05-12 SURGICAL SUPPLY — 56 items
BLADE SAGITTAL AGGR TOOTH XLG (BLADE) ×3 IMPLANT
BNDG COHESIVE 6X5 TAN STRL LF (GAUZE/BANDAGES/DRESSINGS) ×6 IMPLANT
CANISTER SUCT 1200ML W/VALVE (MISCELLANEOUS) ×3 IMPLANT
CHLORAPREP W/TINT 26ML (MISCELLANEOUS) ×3 IMPLANT
DRAPE C-ARM XRAY 36X54 (DRAPES) ×3 IMPLANT
DRAPE INCISE IOBAN 66X60 STRL (DRAPES) IMPLANT
DRAPE POUCH INSTRU U-SHP 10X18 (DRAPES) ×3 IMPLANT
DRAPE SHEET LG 3/4 BI-LAMINATE (DRAPES) ×9 IMPLANT
DRAPE TABLE BACK 80X90 (DRAPES) ×3 IMPLANT
DRESSING SURGICEL FIBRLLR 1X2 (HEMOSTASIS) ×2 IMPLANT
DRSG OPSITE POSTOP 4X8 (GAUZE/BANDAGES/DRESSINGS) IMPLANT
DRSG SURGICEL FIBRILLAR 1X2 (HEMOSTASIS) ×6
ELECT BLADE 6.5 EXT (BLADE) ×3 IMPLANT
ELECT REM PT RETURN 9FT ADLT (ELECTROSURGICAL) ×3
ELECTRODE REM PT RTRN 9FT ADLT (ELECTROSURGICAL) ×1 IMPLANT
GLOVE BIOGEL PI IND STRL 9 (GLOVE) ×1 IMPLANT
GLOVE BIOGEL PI INDICATOR 9 (GLOVE) ×2
GLOVE SURG SYN 9.0  PF PI (GLOVE) ×2
GLOVE SURG SYN 9.0 PF PI (GLOVE) ×1 IMPLANT
GOWN SRG 2XL LVL 4 RGLN SLV (GOWNS) ×1 IMPLANT
GOWN STRL NON-REIN 2XL LVL4 (GOWNS) ×2
GOWN STRL REUS W/ TWL LRG LVL3 (GOWN DISPOSABLE) ×1 IMPLANT
GOWN STRL REUS W/TWL LRG LVL3 (GOWN DISPOSABLE) ×2
HEAD FEMORAL 28MM SZ M (Head) ×3 IMPLANT
HEMOVAC 400CC 10FR (MISCELLANEOUS) ×3 IMPLANT
HOLDER FOLEY CATH W/STRAP (MISCELLANEOUS) ×3 IMPLANT
HOOD PEEL AWAY FLYTE STAYCOOL (MISCELLANEOUS) ×3 IMPLANT
KIT PREVENA INCISION MGT 13 (CANNISTER) ×3 IMPLANT
LINER DM 28MM (Liner) ×3 IMPLANT
MAT BLUE FLOOR 46X72 FLO (MISCELLANEOUS) ×3 IMPLANT
NDL SAFETY ECLIPSE 18X1.5 (NEEDLE) ×1 IMPLANT
NEEDLE HYPO 18GX1.5 SHARP (NEEDLE) ×2
NEEDLE SPNL 18GX3.5 QUINCKE PK (NEEDLE) ×3 IMPLANT
NS IRRIG 1000ML POUR BTL (IV SOLUTION) ×3 IMPLANT
PACK HIP COMPR (MISCELLANEOUS) ×3 IMPLANT
SCALPEL PROTECTED #10 DISP (BLADE) ×6 IMPLANT
SHELL ACETABULAR DM  48MM (Shell) ×3 IMPLANT
SOL PREP PVP 2OZ (MISCELLANEOUS) ×3
SOLUTION PREP PVP 2OZ (MISCELLANEOUS) ×1 IMPLANT
SPONGE DRAIN TRACH 4X4 STRL 2S (GAUZE/BANDAGES/DRESSINGS) ×3 IMPLANT
STAPLER SKIN PROX 35W (STAPLE) ×3 IMPLANT
STEM FEMORAL SZ2 STD COLLARED (Stem) ×3 IMPLANT
STRAP SAFETY 5IN WIDE (MISCELLANEOUS) ×3 IMPLANT
SUT DVC 2 QUILL PDO  T11 36X36 (SUTURE) ×2
SUT DVC 2 QUILL PDO T11 36X36 (SUTURE) ×1 IMPLANT
SUT SILK 0 (SUTURE) ×2
SUT SILK 0 30XBRD TIE 6 (SUTURE) ×1 IMPLANT
SUT V-LOC 90 ABS DVC 3-0 CL (SUTURE) ×3 IMPLANT
SUT VIC AB 1 CT1 36 (SUTURE) ×3 IMPLANT
SYR 20CC LL (SYRINGE) ×3 IMPLANT
SYR 30ML LL (SYRINGE) ×3 IMPLANT
SYR BULB IRRIG 60ML STRL (SYRINGE) ×3 IMPLANT
TAPE MICROFOAM 4IN (TAPE) ×3 IMPLANT
TOWEL OR 17X26 4PK STRL BLUE (TOWEL DISPOSABLE) ×3 IMPLANT
TRAY FOLEY MTR SLVR 16FR STAT (SET/KITS/TRAYS/PACK) ×3 IMPLANT
WND VAC CANISTER 500ML (MISCELLANEOUS) ×3 IMPLANT

## 2018-05-12 NOTE — Transfer of Care (Signed)
Immediate Anesthesia Transfer of Care Note  Patient: Alexandria Moore  Procedure(s) Performed: TOTAL HIP ARTHROPLASTY ANTERIOR APPROACH (Left Hip)  Patient Location: PACU  Anesthesia Type:Spinal  Level of Consciousness: awake and alert   Airway & Oxygen Therapy: Patient Spontanous Breathing  Post-op Assessment: Report given to RN and Post -op Vital signs reviewed and stable  Post vital signs: Reviewed  Last Vitals:  Vitals Value Taken Time  BP 82/49 05/12/2018  9:12 AM  Temp 36.2 C 05/12/2018  9:10 AM  Pulse 55 05/12/2018  9:15 AM  Resp 14 05/12/2018  9:15 AM  SpO2 100 % 05/12/2018  9:15 AM  Vitals shown include unvalidated device data.  Last Pain:  Vitals:   05/12/18 0615  TempSrc: Oral  PainSc: 8          Complications: No apparent anesthesia complications

## 2018-05-12 NOTE — NC FL2 (Signed)
  [place] MEDICAID [id number] LEVEL OF CARE SCREENING TOOL     IDENTIFICATION  Patient Name: Alexandria Moore Birthdate: 21-Mar-1961 Sex: female Admission Date (Current Location): 05/12/2018  [place] and [place] Number:  Chiropodist and Address:  [facility], [address], [place], [place] [address]      Provider Number: [id number]  Attending Physician Name and Address:  Kennedy Bucker, MD  Relative Name and Phone Number:       Current Level of Care: Hospital Recommended Level of Care: Skilled Nursing Facility Prior Approval Number:    Date Approved/Denied:   PASRR Number: ([id number])  Discharge Plan: SNF    Current Diagnoses: Patient Active Problem List   Diagnosis Date Noted  . Status post total hip replacement, left 05/12/2018    Orientation RESPIRATION BLADDER Height & Weight     Self, Time, Situation, Place  Normal Continent Weight: 108.6 kg (239 lb 6 oz) Height:  5\' 4"  (162.6 cm)  BEHAVIORAL SYMPTOMS/MOOD NEUROLOGICAL BOWEL NUTRITION STATUS      Continent Diet(Diet: Regular )  AMBULATORY STATUS COMMUNICATION OF NEEDS Skin   Extensive Assist Verbally Surgical wounds, Wound Vac(Incicion: Left Hip, provena wound vac. )                       Personal Care Assistance Level of Assistance  Bathing, Feeding, Dressing Bathing Assistance: Limited assistance Feeding assistance: Independent Dressing Assistance: Limited assistance     Functional Limitations Info  Sight, Hearing, Speech Sight Info: Adequate Hearing Info: Adequate Speech Info: Adequate    SPECIAL CARE FACTORS FREQUENCY  PT (By licensed PT), OT (By licensed OT)     PT Frequency: (5) OT Frequency: (5)            Contractures      Additional Factors Info  Code Status, Allergies Code Status Info: (not on file. ) Allergies Info: (Hydromorphone, Lisinopril, Meloxicam, Adhesive Tape)           Current Medications (05/12/2018):  This is the current  hospital active medication list Current Facility-Administered Medications  Medication Dose Route Frequency Provider Last Rate Last Dose  . fentaNYL (SUBLIMAZE) injection 25 mcg  25 mcg Intravenous Q5 min PRN 07/12/2018, MD      . lactated ringers infusion   Intravenous Continuous Piscitello, Yves Dill, MD 100 mL/hr at 05/12/18 [phone]    . ondansetron (ZOFRAN) injection 4 mg  4 mg Intravenous Once PRN [id number], MD         Discharge Medications: Please see discharge summary for a list of discharge medications.  Relevant Imaging Results:  Relevant Lab Results:   Additional Information (SSN: Yves Dill)  Ruby Logiudice, [id number], LCSW

## 2018-05-12 NOTE — Evaluation (Signed)
Physical Therapy Evaluation Patient Details Name: Alexandria Moore MRN: [id number] DOB: 10-Jan-1961 Today's Date: 05/12/2018   History of Present Illness  Pt is 57 yo woman s/p L Total Ant hip surgery. PMH of CHF, HTN, Sleep apnea, Arthritis.   Clinical Impression  Pt responded well to PT. Pt was awake and alert on PT arrival and expressed low levels of pain (NPS 4/10) in L hip. Pt performed bed exercises with therapist assist (see details below), and tolerated each one with no reported increase in pain, and minimal need for therapist cueing. Pt needs min assist with bed mobility, and mod assist w/RW for transfers. Pt ambulated 80ft w/RW CGA in room and demonstrated adherence to PWB 50% status. Pt reported pain levels increased to 6/10 NPS with ambulation. Pt standing balance is limited due to pt pain levels and PWB status, but pt shows no loss of balance with ambulation.  Pt HR and SpO2 levels remained WFL during all activities. Pt would benefit from skilled PT to address above deficits and promote optimal return to PLOF. Given pt's home accessibility (details below) and early positive prognosticators for mobility, recommend transition to HHPT upon discharge from acute hospitalization.    Follow Up Recommendations Home health PT    Equipment Recommendations  Rolling walker with 5" wheels;3in1 (PT)    Recommendations for Other Services OT consult     Precautions / Restrictions Precautions Precautions: Fall;Anterior Hip Restrictions Weight Bearing Restrictions: Yes Other Position/Activity Restrictions: LLE PWB 50%      Mobility  Bed Mobility Overal bed mobility: Needs Assistance Bed Mobility: Supine to Sit     Supine to sit: Min assist     General bed mobility comments: Pt needed some assitance with moving LLE off of bed and assistance with bringing trunk forward.   Transfers Overall transfer level: Needs assistance Equipment used: Rolling walker (2 wheeled) Transfers: Sit  to/from Stand Sit to Stand: Mod assist         General transfer comment: Pt requires high level of BUE support to bring himself up to full standing posiiton. Needed mod assit at posterior hip to complete transfer.   Ambulation/Gait Ambulation/Gait assistance: Min guard Gait Distance (Feet): 30 Feet Assistive device: Rolling walker (2 wheeled) Gait Pattern/deviations: Step-to pattern Gait velocity: Decreased.    General Gait Details: Pt is PWB 50% on LLE, pt showed appropriate gait pattern for adherance to precautions. Labored and difficult for pt to complete each step.   Stairs            Wheelchair Mobility    Modified Rankin (Stroke Patients Only)       Balance Overall balance assessment: Needs assistance Sitting-balance support: Feet supported;Bilateral upper extremity supported Sitting balance-Leahy Scale: Good     Standing balance support: Bilateral upper extremity supported Standing balance-Leahy Scale: Fair Standing balance comment: Pt standing balance is limited due to pt PWB status.                              Pertinent Vitals/Pain Pain Assessment: 0-10 Pain Score: 4  Pain Location: L hip Pain Descriptors / Indicators: Aching;Burning;Stabbing Pain Intervention(s): Limited activity within patient's tolerance;Repositioned;RN gave pain meds during session;Utilized relaxation techniques    Home Living Family/patient expects to be discharged to:: Private residence Living Arrangements: Alone Available Help at Discharge: Family(Daughter available for help) Type of Home: House Home Access: Stairs to enter Entrance Stairs-Rails: Can reach both;Right;Left Entrance Stairs-Number of Steps: 4  Home Layout: One level Home Equipment: Walker - 2 wheels      Prior Function Level of Independence: Independent         Comments: Pt was ind with all ADL's and home/community ambulation     Hand Dominance        Extremity/Trunk Assessment    Upper Extremity Assessment Upper Extremity Assessment: Overall WFL for tasks assessed    Lower Extremity Assessment Lower Extremity Assessment: LLE deficits/detail LLE Deficits / Details: AROM limited due to pain, able to move all major msucle groups against gravity (including hip flexion through partial range).  LLE: Unable to fully assess due to pain LLE Sensation: WNL LLE Coordination: WNL       Communication   Communication: No difficulties  Cognition Arousal/Alertness: Awake/alert Behavior During Therapy: WFL for tasks assessed/performed Overall Cognitive Status: Within Functional Limits for tasks assessed                                        General Comments      Exercises Total Joint Exercises Ankle Circles/Pumps: AROM;20 reps;Both;Supine Quad Sets: AROM;10 reps;Supine;Left Heel Slides: AAROM;10 reps;Left;Supine Hip ABduction/ADduction: Left;10 reps;Supine;AAROM Straight Leg Raises: Left;Supine;10 reps;AAROM Other Exercises Other Exercises: Bed mobility: Supine to sit min assist; Transfers: Sit to/from stand w/RW mod assist; Ambulation: 52ft w/RW CGA.  Other Exercises: Educated pt on PWB 50% status, and how to transfer and ambulate while adhering to precaution   Assessment/Plan    PT Assessment Patient needs continued PT services  PT Problem List Decreased strength;Decreased mobility;Decreased activity tolerance;Decreased balance;Pain;Obesity       PT Treatment Interventions DME instruction;Gait training;Stair training;Therapeutic exercise;Therapeutic activities;Balance training;Patient/family education;Functional mobility training    PT Goals (Current goals can be found in the Care Plan section)  Acute Rehab PT Goals Patient Stated Goal: To go home and return to PLOF PT Goal Formulation: With patient/family Time For Goal Achievement: 05/19/18 Potential to Achieve Goals: Good    Frequency Min 2X/week   Barriers to discharge         Co-evaluation               AM-PAC PT "6 Clicks" Daily Activity  Outcome Measure Difficulty turning over in bed (including adjusting bedclothes, sheets and blankets)?: Unable Difficulty moving from lying on back to sitting on the side of the bed? : Unable Difficulty sitting down on and standing up from a chair with arms (e.g., wheelchair, bedside commode, etc,.)?: Unable Help needed moving to and from a bed to chair (including a wheelchair)?: A Lot Help needed walking in hospital room?: A Little Help needed climbing 3-5 steps with a railing? : A Lot 6 Click Score: 10    End of Session Equipment Utilized During Treatment: Gait belt Activity Tolerance: Patient tolerated treatment well Patient left: in chair;with chair alarm set;with SCD's reapplied;with family/visitor present;with call bell/phone within reach Nurse Communication: Mobility status;Weight bearing status PT Visit Diagnosis: Unsteadiness on feet (R26.81);Pain Pain - Right/Left: Left Pain - part of body: Hip    Time: 1400-1440 PT Time Calculation (min) (ACUTE ONLY): 40 min   Charges:            Grayland Jack, SPT 05/12/18,4:44 PM

## 2018-05-12 NOTE — H&P (Signed)
Reviewed paper H+P, will be scanned into chart. No changes noted.  

## 2018-05-12 NOTE — Anesthesia Preprocedure Evaluation (Signed)
Anesthesia Evaluation  Patient identified by MRN, date of birth, ID band Patient awake    Reviewed: Allergy & Precautions, NPO status , Patient's Chart, lab work & pertinent test results, reviewed documented beta blocker date and time   Airway Mallampati: III  TM Distance: >3 FB     Dental   Pulmonary shortness of breath, sleep apnea , former smoker,    Pulmonary exam normal        Cardiovascular hypertension, +CHF  Normal cardiovascular exam+ Valvular Problems/Murmurs      Neuro/Psych negative neurological ROS  negative psych ROS   GI/Hepatic Neg liver ROS, GERD  ,  Endo/Other  Hypothyroidism   Renal/GU negative Renal ROS  negative genitourinary   Musculoskeletal  (+) Arthritis , Osteoarthritis,    Abdominal   Peds negative pediatric ROS (+)  Hematology negative hematology ROS (+)   Anesthesia Other Findings   Reproductive/Obstetrics                             Anesthesia Physical Anesthesia Plan  ASA: III  Anesthesia Plan: Spinal   Post-op Pain Management:    Induction: Intravenous  PONV Risk Score and Plan:   Airway Management Planned: Nasal Cannula  Additional Equipment:   Intra-op Plan:   Post-operative Plan:   Informed Consent: I have reviewed the patients History and Physical, chart, labs and discussed the procedure including the risks, benefits and alternatives for the proposed anesthesia with the patient or authorized representative who has indicated his/her understanding and acceptance.   Dental advisory given  Plan Discussed with: CRNA and Surgeon  Anesthesia Plan Comments:         Anesthesia Quick Evaluation

## 2018-05-12 NOTE — Op Note (Signed)
05/12/2018  9:12 AM  PATIENT:  Alexandria Moore  57 y.o. female  PRE-OPERATIVE DIAGNOSIS:  PRIMARY OSTEOARTHRITIS LEFT HIP  POST-OPERATIVE DIAGNOSIS:  PRIMARY OSTEOARTHRITIS LEFT HIP  PROCEDURE:  Procedure(s): TOTAL HIP ARTHROPLASTY ANTERIOR APPROACH (Left)  SURGEON: Leitha Schuller, MD  ASSISTANTS: None  ANESTHESIA:   spinal  EBL:  Total I/O In: 500 [I.V.:500] Out: 750 [Urine:150; Blood:600]  BLOOD ADMINISTERED:none  DRAINS: (2) Hemovact drain(s) in the Subcutaneous layer with  Suction Open   LOCAL MEDICATIONS USED:  MARCAINE     SPECIMEN:  Source of Specimen:  Left femoral head  DISPOSITION OF SPECIMEN:  PATHOLOGY  COUNTS:  YES  TOURNIQUET:  * No tourniquets in log *  IMPLANTS: Medacta AMIS 2 standard, 48 mm Mpact DM cup and liner, M 28 mm ceramic head  DICTATION: .Dragon Dictation   The patient was brought to the operating room and after spinal anesthesia was obtained patient was placed on the operative table with the ipsilateral foot into the Medacta attachment, contralateral leg on a well-padded table. C-arm was brought in and preop template x-ray taken. After prepping and draping in usual sterile fashion appropriate patient identification and timeout procedures were completed. Anterior approach to the hip was obtained and centered over the greater trochanter and TFL muscle. The subcutaneous tissue was incised hemostasis being achieved by electrocautery. TFL fascia was incised and the muscle retracted laterally deep retractor placed. The lateral femoral circumflex vessels were identified and ligated. The anterior capsule was exposed and a capsulotomy performed. The neck was identified and a femoral neck cut carried out with a saw. The head was removed without difficulty and showed sclerotic femoral head and acetabulum. Reaming was carried out to 48 mm and a 38 mm cup trial gave appropriate tightness to the acetabular component a 48 DM cup was impacted into position. The leg  was then externally rotated and ischiofemoral and pubofemoral releases carried out. The femur was sequentially broached to a size 2, size 2 standard with S head trials were placed and the final components chosen. The 2 standard stem was inserted along with a M ceramic 28 mm head and 48 mm liner. The hip was reduced and was stable the wound was thoroughly irrigated with fibrillar placed along the posterior capsulotomy and medial neck. The deep fascia was closed using a heavy Quill after infiltration of 30 cc of quarter percent Sensorcaine with epinephrine. Subcutaneous drains were then inserted,3-0 v-loc to close the skin with skin staples incisional wound VAC applied  PLAN OF CARE: Admit to inpatient

## 2018-05-12 NOTE — Anesthesia Post-op Follow-up Note (Signed)
Anesthesia QCDR form completed.        

## 2018-05-12 NOTE — Anesthesia Procedure Notes (Signed)
Spinal  Patient location during procedure: OR Staffing Anesthesiologist: Alvin Critchley, MD Other anesthesia staff: Wess Botts, RN Performed: other anesthesia staff  Preanesthetic Checklist Completed: patient identified, site marked, surgical consent, pre-op evaluation, timeout performed, IV checked, risks and benefits discussed and monitors and equipment checked Spinal Block Patient position: sitting Prep: ChloraPrep Patient monitoring: heart rate, continuous pulse ox, blood pressure and cardiac monitor Approach: midline Location: L3-4 Injection technique: single-shot Needle Needle type: Pencan  Needle gauge: 24 G Needle length: 9 cm Additional Notes Negative paresthesia. Negative blood return. Positive free-flowing CSF. Expiration date of kit checked and confirmed. Patient tolerated procedure well, without complications.

## 2018-05-13 LAB — CBC
HEMATOCRIT: 29.4 % — AB (ref 35.0–47.0)
Hemoglobin: 10 g/dL — ABNORMAL LOW (ref 12.0–16.0)
MCH: 32 pg (ref 26.0–34.0)
MCHC: 34.2 g/dL (ref 32.0–36.0)
MCV: 93.7 fL (ref 80.0–100.0)
Platelets: 188 10*3/uL (ref 150–440)
RBC: 3.14 MIL/uL — ABNORMAL LOW (ref 3.80–5.20)
RDW: 15 % — AB (ref 11.5–14.5)
WBC: 9.5 10*3/uL (ref 3.6–11.0)

## 2018-05-13 LAB — BASIC METABOLIC PANEL
Anion gap: 3 — ABNORMAL LOW (ref 5–15)
BUN: 16 mg/dL (ref 6–20)
CALCIUM: 8.7 mg/dL — AB (ref 8.9–10.3)
CO2: 26 mmol/L (ref 22–32)
Chloride: 114 mmol/L — ABNORMAL HIGH (ref 98–111)
Creatinine, Ser: 0.67 mg/dL (ref 0.44–1.00)
GFR calc Af Amer: >60 mL/min (ref >60)
GLUCOSE: 136 mg/dL — AB (ref 70–99)
Potassium: 4.2 mmol/L (ref 3.5–5.1)
Sodium: 143 mmol/L (ref 135–145)

## 2018-05-13 MED ORDER — METHOCARBAMOL 500 MG PO TABS: 500.0000 mg | ORAL_TABLET | Freq: Four times a day (QID) | ORAL | 0 refills | Status: DC | PRN

## 2018-05-13 MED ORDER — ASPIRIN 325 MG PO TBEC: 325.0000 mg | DELAYED_RELEASE_TABLET | Freq: Every day | ORAL | 0 refills | Status: DC

## 2018-05-13 MED ORDER — MAGNESIUM HYDROXIDE 400 MG/5ML PO SUSP: 30.0000 mL | Freq: Every day | ORAL | Status: DC | PRN

## 2018-05-13 MED ORDER — ACETAMINOPHEN 500 MG PO TABS: 1000.0000 mg | ORAL_TABLET | Freq: Four times a day (QID) | ORAL | 0 refills | Status: DC

## 2018-05-13 MED ORDER — DOCUSATE SODIUM 100 MG PO CAPS: 100.0000 mg | ORAL_CAPSULE | Freq: Two times a day (BID) | ORAL | 0 refills | Status: DC

## 2018-05-13 MED ORDER — OXYCODONE HCL 5 MG PO TABS: 5.0000 mg | ORAL_TABLET | ORAL | 0 refills | Status: DC | PRN

## 2018-05-13 NOTE — Discharge Summary (Signed)
Physician Discharge Summary  Patient ID: Alexandria Moore MRN: [id number] DOB/AGE: [birth year] 57 y.o.  Admit date: 05/12/2018 Discharge date: 05/14/2018  Admission Diagnoses:  PRIMARY OSTEOARTHRITIS LEFT HIP   Discharge Diagnoses: Patient Active Problem List   Diagnosis Date Noted  . Status post total hip replacement, left 05/12/2018    Past Medical History:  Diagnosis Date  . Arthritis   . CHF (congestive heart failure) (HCC)   . Dyspnea    not so much now since weight loss  . GERD (gastroesophageal reflux disease)   . Heart murmur    not being followed or treated. has lost weight with improvement of murmur  . History of blood transfusion    unsure of when she had transfusion but states she had a terrible reaction and needed medication  . Hypertension   . Hypothyroidism    not on meds at this time  . Sleep apnea    does not use cpap since weight loss     Transfusion: none   Consultants (if any):   Discharged Condition: Improved  Hospital Course: Alexandria Moore is an 57 y.o. female who was admitted 05/12/2018 with a diagnosis of  Left hip osteoathritis and went to the operating room on 05/12/2018 and underwent the above named procedures.    Surgeries: Procedure(s): TOTAL HIP ARTHROPLASTY ANTERIOR APPROACH on 05/12/2018 Patient tolerated the surgery well. Taken to PACU where she was stabilized and then transferred to the orthopedic floor.  Started on aspirin 325 mg daily with SCDs, teds. Heels elevated on bed with rolled towels. No evidence of DVT. Negative Homan. Physical therapy started on day #1 for gait training and transfer. OT started day #1 for ADL and assisted devices.  Patient's foley was d/c on day #1. Patient's IV and hemovac was d/c on day #2.  On post op day #2 patient was stable and ready for discharge to home with HHPT.  Implants: Medacta AMIS 2 standard, 48 mm Mpact DM cup and liner, M 28 mm ceramic head    She was given perioperative antibiotics:   Anti-infectives (From admission, onward)   Start     Dose/Rate Route Frequency Ordered Stop   05/12/18 0619  ceFAZolin (ANCEF) 2-4 GM/100ML-% IVPB    Note to Pharmacy:  Letta Pate   : cabinet override      05/12/18 0619 05/12/18 0738   05/11/18 2215  ceFAZolin (ANCEF) IVPB 2g/100 mL premix     2 g 200 mL/hr over 30 Minutes Intravenous  Once 05/11/18 2200 05/12/18 0808    .  She was given sequential compression devices, early ambulation, and aspirin for DVT prophylaxis.  She benefited maximally from the hospital stay and there were no complications.    Recent vital signs:  Vitals:   05/13/18 1230 05/13/18 1656  BP: (!) 91/57 (!) 108/59  Pulse: (!) 54 (!) 52  Resp:  18  Temp:  97.6 F (36.4 C)  SpO2:  100%    Recent laboratory studies:  Lab Results  Component Value Date   HGB 10.0 (L) 05/13/2018   HGB 12.7 05/04/2018   HGB 14.0 09/05/2012   Lab Results  Component Value Date   WBC 9.5 05/13/2018   PLT 188 05/13/2018   Lab Results  Component Value Date   INR 0.94 05/04/2018   Lab Results  Component Value Date   NA 143 05/13/2018   K 4.2 05/13/2018   CL 114 (H) 05/13/2018   CO2 26 05/13/2018   BUN 16 05/13/2018  CREATININE 0.67 05/13/2018   GLUCOSE 136 (H) 05/13/2018    Discharge Medications:   Allergies as of 05/13/2018      Reactions   Hydromorphone Anaphylaxis, Shortness Of Breath   Lisinopril Other (See Comments), Anaphylaxis, Nausea And Vomiting, Nausea Only   Other reaction(s): Abdominal Pain Other reaction(s): Abdominal Pain Other reaction(s): Abdominal Pain Other reaction(s): Abdominal Pain   Meloxicam Anaphylaxis, Other (See Comments)   Weakness in Legs Other reaction(s): Other (See Comments) Weakness in Legs Weakness in Legs   Adhesive [tape] Other (See Comments)   Irritates skin. Paper tape is okay      Medication List    STOP taking these medications   ibuprofen 800 MG tablet Commonly known as:  ADVIL,MOTRIN    oxyCODONE-acetaminophen 5-325 MG tablet Commonly known as:  PERCOCET/ROXICET     TAKE these medications   acetaminophen 500 MG tablet Commonly known as:  TYLENOL Take 2 tablets (1,000 mg total) by mouth every 6 (six) hours. What changed:    medication strength  how much to take  when to take this  reasons to take this   acyclovir ointment 5 % Commonly known as:  ZOVIRAX Apply 1 application topically as needed (for cold sores).   amLODipine 10 MG tablet Commonly known as:  NORVASC Take 10 mg by mouth daily.   aspirin 325 MG EC tablet Take 1 tablet (325 mg total) by mouth daily with breakfast. Start taking on:  05/14/2018   carvedilol 25 MG tablet Commonly known as:  COREG Take 25 mg by mouth 2 (two) times daily with a meal.   cloNIDine 0.1 MG tablet Commonly known as:  CATAPRES Take 0.1 mg by mouth 2 (two) times daily.   coal tar 0.5 % shampoo Commonly known as:  NEUTROGENA T-GEL Apply topically at bedtime as needed.   docusate sodium 100 MG capsule Commonly known as:  COLACE Take 1 capsule (100 mg total) by mouth 2 (two) times daily.   fluocinolone 0.025 % cream Commonly known as:  SYNALAR Apply topically 2 (two) times daily. Apply to scalp twice daily as needed for scaling and itching   losartan 100 MG tablet Commonly known as:  COZAAR Take 100 mg by mouth daily.   methocarbamol 500 MG tablet Commonly known as:  ROBAXIN Take 1 tablet (500 mg total) by mouth every 6 (six) hours as needed for muscle spasms.   omeprazole 20 MG capsule Commonly known as:  PRILOSEC Take 20 mg by mouth daily.   oxyCODONE 5 MG immediate release tablet Commonly known as:  Oxy IR/ROXICODONE Take 1-2 tablets (5-10 mg total) by mouth every 4 (four) hours as needed for moderate pain (pain score 4-6).            Durable Medical Equipment  (From admission, onward)        Start     Ordered   05/12/18 1120  DME Walker rolling  Once    Question:  Patient needs a walker to  treat with the following condition  Answer:  Status post total hip replacement, left   05/12/18 1120   05/12/18 1120  DME 3 n 1  Once     05/12/18 1120   05/12/18 1120  DME Bedside commode  Once    Question:  Patient needs a bedside commode to treat with the following condition  Answer:  Status post total hip replacement, left   05/12/18 1120      Diagnostic Studies: Dg Hip Operative Unilat W Or W/o  Pelvis Left  Result Date: 05/12/2018 CLINICAL DATA:  Left hip replacement. EXAM: OPERATIVE LEFT HIP (WITH PELVIS IF PERFORMED) 1 VIEWS TECHNIQUE: Fluoroscopic spot image(s) were submitted for interpretation post-operatively. COMPARISON:  MRI 01/12/2017. FINDINGS: Total left hip replacement. Hardware intact. Anatomic alignment. No acute bony abnormality. IMPRESSION: Left hip replacement with anatomic alignment. Electronically Signed   By: Maisie Fus  Register   On: 05/12/2018 09:33   Dg Hip Unilat W Or W/o Pelvis 2-3 Views Left  Result Date: 05/12/2018 CLINICAL DATA:  Status post left total hip replacement. EXAM: DG HIP (WITH OR WITHOUT PELVIS) 2-3V LEFT COMPARISON:  06/12/2016. FINDINGS: Interval left hip prosthesis in satisfactory position and alignment. No fracture or dislocation seen. IMPRESSION: Satisfactory postoperative appearance of a left hip prosthesis. Electronically Signed   By: Beckie Salts M.D.   On: 05/12/2018 09:45    Disposition:     Follow-up Information    Kennedy Bucker, MD Follow up in 2 week(s).   Specialty:  Orthopedic Surgery Contact information: [address] [place] [address] [phone]            Signed: Patience Musca 05/13/2018, 8:47 PM

## 2018-05-13 NOTE — Progress Notes (Signed)
Physical Therapy Treatment Patient Details Name: Alexandria Moore MRN: [id number] DOB: [birth year] Today's Date: 05/13/2018    History of Present Illness Pt is 57 yo woman s/p L Total Ant hip surgery. PMH of CHF, HTN, Sleep apnea, Arthritis. PWB 50% for 1 mo post op    PT Comments    Pt progressed well in PM treatment session. Pt BP was slightly hypotensive earlier in day, and caused pt nausea. Nursing started IV fluids to improve BP. PT ran orthostatics and pt BP values were WNL in supine, sitting, and standing. Pt pain levels remain low 4/10 NPS. Pt bed mobility and transfer ability remains larger unchanged from this morning, however pt body language shows less exertion with supine to sit and sit to stand activities. Pt was able to ambulate 200 ft w/RW CGA, continuing to adhere to [facility] status, and noting to PT that she feels more "normal" with gait. Pt was able to navigate 4 stairs safely. Pt tolerated stair training and was CGA for ascending and descending. Further stair training is recommended to focus on improving pt safety and cleaning up mechanics for mainatinaing PWB status while navigating stairs. Recommend transition to HHPT upon discharge from acute hospitalization.  Follow Up Recommendations  Home health PT     Equipment Recommendations  Rolling walker with 5" wheels;3in1 (PT)    Recommendations for Other Services       Precautions / Restrictions Precautions Precautions: Fall;Anterior Hip Restrictions Weight Bearing Restrictions: Yes LLE Weight Bearing: Partial weight bearing LLE Partial Weight Bearing Percentage or Pounds: 50%    Mobility  Bed Mobility Overal bed mobility: Needs Assistance Bed Mobility: Supine to Sit     Supine to sit: Modified independent (Device/Increase time)     General bed mobility comments: Pt able to move legs to EOB under own power using BUE for assist, pt shifted weight suing railings.   Transfers Overall transfer level: Needs  assistance Equipment used: Rolling walker (2 wheeled) Transfers: Sit to/from Stand Sit to Stand: Min assist         General transfer comment: Pt able stand on second attempt with slight assist on post pelvis from therapist. Pt showed appropriate ahderence to [facility] status  Ambulation/Gait Ambulation/Gait assistance: Min guard Gait Distance (Feet): 200 Feet Assistive device: Rolling walker (2 wheeled) Gait Pattern/deviations: Step-to pattern Gait velocity: Decreased. 10 ft in 8 secs   General Gait Details: Pt is PWB 50% on LLE, pt showed appropriate gait pattern for adherance to precautions. Pt reports no increase in pain with ambulation   Stairs Stairs: Yes Stairs assistance: Min guard Stair Management: Two rails;One rail Left Number of Stairs: 4 General stair comments: Going up pt used step to pattern (leading with RLE) facing forward and suing Bilat hand rails. Coming down pt faced hand rails and descended stairs laterally with step to pattern (LLE leading).   Wheelchair Mobility    Modified Rankin (Stroke Patients Only)       Balance Overall balance assessment: Needs assistance Sitting-balance support: Feet supported Sitting balance-Leahy Scale: Good     Standing balance support: No upper extremity supported Standing balance-Leahy Scale: Good Standing balance comment: Pt standing balance is limited due to pt PWB status, but is still able to reach for objects on desk with no BUE support without loss of balance                             Cognition Arousal/Alertness: Awake/alert Behavior  During Therapy: WFL for tasks assessed/performed Overall Cognitive Status: Within Functional Limits for tasks assessed                                        Exercises Total Joint Exercises Ankle Circles/Pumps: AROM;20 reps;Both;Supine Heel Slides: Left;Supine;AROM;20 reps Hip ABduction/ADduction: Left;Supine;AAROM;15 reps Straight Leg Raises:  Left;Supine;AAROM;15 reps;Seated Long Arc Quad: AROM;10 reps;Seated;Left Other Exercises Other Exercises: Bed mobility: Supine to sit mod ind; Transfers: Sit to/from stand w/RW min assist; Ambulation: 214ft w/RW CGA. Stair navigation: 4 steps CGA Other Exercises: Reiterated education to pt on PWB 50% status, and how to transfer and ambulate while adhering to precaution    General Comments        Pertinent Vitals/Pain Pain Assessment: 0-10 Pain Score: 4  Pain Location: L hip Pain Descriptors / Indicators: Aching;Burning;Stabbing Pain Intervention(s): Limited activity within patient's tolerance;Monitored during session;Premedicated before session;Repositioned    Home Living                      Prior Function            PT Goals (current goals can now be found in the care plan section) Acute Rehab PT Goals Patient Stated Goal: To go home and return to PLOF Progress towards PT goals: Progressing toward goals    Frequency    BID      PT Plan Current plan remains appropriate    Co-evaluation              AM-PAC PT "6 Clicks" Daily Activity  Outcome Measure  Difficulty turning over in bed (including adjusting bedclothes, sheets and blankets)?: A Little Difficulty moving from lying on back to sitting on the side of the bed? : A Little Difficulty sitting down on and standing up from a chair with arms (e.g., wheelchair, bedside commode, etc,.)?: A Little Help needed moving to and from a bed to chair (including a wheelchair)?: A Little Help needed walking in hospital room?: A Little Help needed climbing 3-5 steps with a railing? : A Little 6 Click Score: 18    End of Session Equipment Utilized During Treatment: Gait belt Activity Tolerance: Patient tolerated treatment well Patient left: with SCD's reapplied;with call bell/phone within reach;in bed;with bed alarm set Nurse Communication: Mobility status;Other (comment)(pt ambulated to toilet and used  bathroom) PT Visit Diagnosis: Unsteadiness on feet (R26.81);Pain Pain - Right/Left: Left Pain - part of body: Hip     Time: 1435-1520 PT Time Calculation (min) (ACUTE ONLY): 45 min  Charges:                       Grayland Jack, SPT 05/13/18,3:44 PM

## 2018-05-13 NOTE — Progress Notes (Signed)
Physical Therapy Treatment Patient Details Name: Alexandria Moore MRN: [id number] DOB: [birth year] Today's Date: 05/13/2018    History of Present Illness Pt is 57 yo woman s/p L Total Ant hip surgery. PMH of CHF, HTN, Sleep apnea, Arthritis. PWB 50% for 1 mo post op    PT Comments    Pt shows excellent improvement from previous session. Pt needed less assist with bed mobility, now mod ind. And less assist with transfers, now Min assist. Pt ambulated 100 ft w/ RW CGA, and reported no increase in pain while doing so. Pt is adhering to [facility] status without therapist cueing, and verbalized understanding importance of this status for next month. Pt performed all bed exercises without undue fatigue and demonstrated appropriate form without need for cueing. Plan to increase pt ambulation distance and trial stair navigation in future session. Pt remains appropriate for d/c to home with HHPT.   Follow Up Recommendations  Home health PT     Equipment Recommendations  Rolling walker with 5" wheels;3in1 (PT)    Recommendations for Other Services       Precautions / Restrictions Precautions Precautions: Fall;Anterior Hip Restrictions Weight Bearing Restrictions: Yes LLE Weight Bearing: Partial weight bearing LLE Partial Weight Bearing Percentage or Pounds: 50%    Mobility  Bed Mobility Overal bed mobility: Needs Assistance Bed Mobility: Supine to Sit     Supine to sit: Modified independent (Device/Increase time)     General bed mobility comments: Pt able to move legs to EOB under own power using BUE for assist, pt shifted weight suing railings.   Transfers Overall transfer level: Needs assistance Equipment used: Rolling walker (2 wheeled) Transfers: Sit to/from Stand Sit to Stand: Min assist         General transfer comment: Pt able stand on second attempt with slight assist on post pelvis from therapist. Pt showed appropriate ahderence to [facility]  status  Ambulation/Gait Ambulation/Gait assistance: Min guard Gait Distance (Feet): 100 Feet Assistive device: Rolling walker (2 wheeled) Gait Pattern/deviations: Step-to pattern Gait velocity: Decreased.    General Gait Details: Pt is PWB 50% on LLE, pt showed appropriate gait pattern for adherance to precautions. Pt reprots no increase in pain with ambulation   Stairs             Wheelchair Mobility    Modified Rankin (Stroke Patients Only)       Balance Overall balance assessment: Needs assistance Sitting-balance support: Feet supported;Bilateral upper extremity supported Sitting balance-Leahy Scale: Good     Standing balance support: Bilateral upper extremity supported Standing balance-Leahy Scale: Good                              Cognition Arousal/Alertness: Awake/alert Behavior During Therapy: WFL for tasks assessed/performed Overall Cognitive Status: Within Functional Limits for tasks assessed                                        Exercises Total Joint Exercises Ankle Circles/Pumps: AROM;20 reps;Both;Supine Heel Slides: Left;Supine;AROM;20 reps Hip ABduction/ADduction: Left;Supine;AAROM;15 reps Straight Leg Raises: Left;Supine;AAROM;15 reps;Seated Other Exercises Other Exercises: Bed mobility: Supine to sit mod ind; Transfers: Sit to/from stand w/RW min assist; Ambulation: 137ft w/RW CGA.  Other Exercises: Reiterated education to pt on PWB 50% status, and how to transfer and ambulate while adhering to precaution    General Comments  Pertinent Vitals/Pain Pain Assessment: 0-10 Pain Score: 4  Pain Location: L hip Pain Descriptors / Indicators: Aching;Burning;Stabbing Pain Intervention(s): Limited activity within patient's tolerance;Monitored during session;Patient requesting pain meds-RN notified;Repositioned    Home Living                      Prior Function            PT Goals (current goals  can now be found in the care plan section) Progress towards PT goals: Progressing toward goals    Frequency    BID      PT Plan Current plan remains appropriate    Co-evaluation              AM-PAC PT "6 Clicks" Daily Activity  Outcome Measure  Difficulty turning over in bed (including adjusting bedclothes, sheets and blankets)?: A Lot Difficulty moving from lying on back to sitting on the side of the bed? : A Lot Difficulty sitting down on and standing up from a chair with arms (e.g., wheelchair, bedside commode, etc,.)?: A Little Help needed moving to and from a bed to chair (including a wheelchair)?: A Little Help needed walking in hospital room?: A Little Help needed climbing 3-5 steps with a railing? : A Lot 6 Click Score: 15    End of Session Equipment Utilized During Treatment: Gait belt Activity Tolerance: Patient tolerated treatment well Patient left: in chair;with chair alarm set;with SCD's reapplied;with family/visitor present;with call bell/phone within reach Nurse Communication: Mobility status;Patient requests pain meds PT Visit Diagnosis: Unsteadiness on feet (R26.81);Pain Pain - Right/Left: Left Pain - part of body: Hip     Time: 6759-1638 PT Time Calculation (min) (ACUTE ONLY): 25 min  Charges:                        Grayland Jack, SPT 05/13/18,9:31 AM

## 2018-05-13 NOTE — Evaluation (Signed)
Occupational Therapy Evaluation Patient Details Name: Alexandria Moore MRN: [id number] DOB: 1961/02/14 Today's Date: 05/13/2018    History of Present Illness Pt is 57 yo woman s/p L Total Ant hip surgery. PMH of CHF, HTN, Sleep apnea, Arthritis. PWB 50% for 1 mo post op   Clinical Impression   Pt seen for OT evaluation this date, POD#1 from above surgery. Pt was independent in all ADLs prior to surgery, however notes difficulty with toilet transfers due to L hip pain. Pt is eager to return to PLOF with less pain and improved safety and independence. Pt currently requires minimal assist for LB dressing and bathing while in seated position due to pain and limited AROM of L hip. Pt instructed in self care skills, falls prevention strategies, home/routines modifications, DME/AE for LB bathing and dressing tasks, and compression stocking mgt strategies. Also educated in anti-skid tape for bottom of walk in shower to improve safety. Pt verbalized understanding of education/training provided this session.  Pt would benefit from additional instruction in self care skills and techniques to help maintain precautions with or without assistive devices to support recall and carryover prior to discharge. Do not anticipate skilled OT needs upon discharge.       Follow Up Recommendations  No OT follow up    Equipment Recommendations  3 in 1 bedside commode;Other (comment)(reacher)    Recommendations for Other Services       Precautions / Restrictions Precautions Precautions: Fall;Anterior Hip Restrictions Weight Bearing Restrictions: Yes LLE Weight Bearing: Partial weight bearing LLE Partial Weight Bearing Percentage or Pounds: 50%      Mobility Bed Mobility     General bed mobility comments: deferred, up in recliner  Transfers         General transfer comment: unable to attempt 2/2 nausea. Blue emesis bag provided, RN notified and in room at end of session    Balance Overall balance  assessment: Needs assistance Sitting-balance support: Feet supported;Bilateral upper extremity supported Sitting balance-Leahy Scale: Good                                 ADL either performed or assessed with clinical judgement   ADL Overall ADL's : Needs assistance/impaired Eating/Feeding: Sitting;Independent   Grooming: Sitting;Independent   Upper Body Bathing: Sitting;Independent   Lower Body Bathing: Sit to/from stand;Minimal assistance   Upper Body Dressing : Sitting;Independent   Lower Body Dressing: Sit to/from stand;Minimal assistance Lower Body Dressing Details (indicate cue type and reason): instructed in compression stocking mgt and AE for LB dressing; pt verbalized understanding Toilet Transfer: RW;Minimal assistance;Ambulation;BSC Toilet Transfer Details (indicate cue type and reason): educated in use of BSC beside bed for overnight toileting needs and overtop stardard height toilet during the day to improve toilet transfer safety/independence         Functional mobility during ADLs: Min guard;Rolling walker       Vision Baseline Vision/History: Wears glasses Wears Glasses: Reading only Patient Visual Report: No change from baseline       Perception     Praxis      Pertinent Vitals/Pain Pain Assessment: 0-10 Pain Score: 4  Pain Location: L hip Pain Descriptors / Indicators: Aching;Burning;Stabbing Pain Intervention(s): Limited activity within patient's tolerance;Monitored during session;Premedicated before session     Hand Dominance Right   Extremity/Trunk Assessment Upper Extremity Assessment Upper Extremity Assessment: Overall WFL for tasks assessed   Lower Extremity Assessment Lower Extremity Assessment: Defer  to PT evaluation;LLE deficits/detail       Communication Communication Communication: No difficulties   Cognition Arousal/Alertness: Awake/alert Behavior During Therapy: WFL for tasks assessed/performed Overall  Cognitive Status: Within Functional Limits for tasks assessed                                     General Comments       Exercises Other Exercises: Pt able to verbalize PWBing with 100% accuracy Other Exercises: Pt educated in anti skid tape for bottom of shower to minimize risk of falls   Shoulder Instructions      Home Living Family/patient expects to be discharged to:: Private residence Living Arrangements: Alone Available Help at Discharge: Family;Available PRN/intermittently(Daughter available for help, boyfriend, grandson could stay with her for a couple days) Type of Home: House Home Access: Stairs to enter [employer] of Steps: 4 Entrance Stairs-Rails: Can reach both;Right;Left Home Layout: One level     Bathroom Shower/Tub: Producer, television/film/video: Standard(Pt notes she has had difficulty transferring to toilet)     Home Equipment: Walker - 2 wheels          Prior Functioning/Environment Level of Independence: Independent        Comments: Pt was ind with all ADL's and home/community ambulation, no falls, endorses some difficulty with getting up/down from standard height toilet at home        OT Problem List: Decreased strength;Decreased knowledge of use of DME or AE;Decreased range of motion;Pain;Impaired balance (sitting and/or standing)      OT Treatment/Interventions: Self-care/ADL training;Balance training;Therapeutic exercise;Therapeutic activities;DME and/or AE instruction;Patient/family education    OT Goals(Current goals can be found in the care plan section) Acute Rehab OT Goals Patient Stated Goal: To go home and return to PLOF OT Goal Formulation: With patient Time For Goal Achievement: 05/27/18 Potential to Achieve Goals: Good ADL Goals Pt Will Perform Lower Body Dressing: sit to/from stand;with modified independence;with adaptive equipment(AE as needed) Pt Will Transfer to Toilet: with  supervision;ambulating(BSC over toilet, LRAD for amb, maintaining PWBing LLE)  OT Frequency: Min 1X/week   Barriers to D/C:            Co-evaluation              AM-PAC PT "6 Clicks" Daily Activity     Outcome Measure Help from another person eating meals?: None Help from another person taking care of personal grooming?: None Help from another person toileting, which includes using toliet, bedpan, or urinal?: A Little Help from another person bathing (including washing, rinsing, drying)?: A Little Help from another person to put on and taking off regular upper body clothing?: None Help from another person to put on and taking off regular lower body clothing?: A Little 6 Click Score: 21   End of Session    Activity Tolerance: Patient tolerated treatment well(limited slightly by nausea) Patient left: in chair;with call bell/phone within reach;with chair alarm set;with nursing/sitter in room;with SCD's reapplied  OT Visit Diagnosis: Other abnormalities of gait and mobility (R26.89);Pain Pain - Right/Left: Left Pain - part of body: Hip                Time: 1020-1036 OT Time Calculation (min): 16 min Charges:  OT General Charges $OT Visit: 1 Visit OT Evaluation $OT Eval Low Complexity: 1 Low OT Treatments $Self Care/Home Management : 8-22 mins  Richrd Prime, MPH, MS, OTR/L  ascom [phone] 05/13/18, 10:47 AM

## 2018-05-13 NOTE — Anesthesia Postprocedure Evaluation (Signed)
Anesthesia Post Note  Patient: Alexandria Moore  Procedure(s) Performed: TOTAL HIP ARTHROPLASTY ANTERIOR APPROACH (Left Hip)  Patient location during evaluation: Nursing Unit Anesthesia Type: Spinal Level of consciousness: awake, awake and alert, oriented and patient cooperative Pain management: pain level controlled Vital Signs Assessment: post-procedure vital signs reviewed and stable Respiratory status: spontaneous breathing Cardiovascular status: stable Postop Assessment: no headache, no backache, adequate PO intake, patient able to bend at knees and no apparent nausea or vomiting Anesthetic complications: no     Last Vitals:  Vitals:   05/13/18 0013 05/13/18 0411  BP: 132/71 112/62  Pulse: 61 (!) 58  Resp: 16 17  Temp: 36.6 C (!) 36.4 C  SpO2: 100% 100%    Last Pain:  Vitals:   05/13/18 0602  TempSrc:   PainSc: 2                  Bubber Rothert,  Cylee Dattilo R

## 2018-05-13 NOTE — Discharge Instructions (Signed)
ANTERIOR APPROACH TOTAL HIP REPLACEMENT POSTOPERATIVE DIRECTIONS   Hip Rehabilitation, Guidelines Following Surgery  The results of a hip operation are greatly improved after range of motion and muscle strengthening exercises. Follow all safety measures which are given to protect your hip. If any of these exercises cause increased pain or swelling in your joint, decrease the amount until you are comfortable again. Then slowly increase the exercises. Call your caregiver if you have problems or questions.   HOME CARE INSTRUCTIONS  Remove items at home which could result in a fall. This includes throw rugs or furniture in walking pathways.   ICE to the affected hip every three hours for 30 minutes at a time and then as needed for pain and swelling.  Continue to use ice on the hip for pain and swelling from surgery. You may notice swelling that will progress down to the foot and ankle.  This is normal after surgery.  Elevate the leg when you are not up walking on it.    Continue to use the breathing machine which will help keep your temperature down.  It is common for your temperature to cycle up and down following surgery, especially at night when you are not up moving around and exerting yourself.  The breathing machine keeps your lungs expanded and your temperature down.  Do not place pillow under knee, focus on keeping the knee straight while resting  DIET You may resume your previous home diet once your are discharged from the hospital.  DRESSING / WOUND CARE / SHOWERING Please remove provena negative pressure dressing on 05/21/2018 and apply honey comb dressing. Keep dressing clean and dry at all times.   ACTIVITY Walk with your walker as instructed. Use walker as long as suggested by your caregivers. Avoid periods of inactivity such as sitting longer than an hour when not asleep. This helps prevent blood clots.  You may resume a sexual relationship in one month or when given the OK by  your doctor.  You may return to work once you are cleared by your doctor.  Do not drive a car for 6 weeks or until released by you surgeon.  Do not drive while taking narcotics.  WEIGHT BEARING Weight bearing as tolerated. Use walker/cane as needed for at least 4 weeks post op.  POSTOPERATIVE CONSTIPATION PROTOCOL Constipation - defined medically as fewer than three stools per week and severe constipation as less than one stool per week.  One of the most common issues patients have following surgery is constipation.  Even if you have a regular bowel pattern at home, your normal regimen is likely to be disrupted due to multiple reasons following surgery.  Combination of anesthesia, postoperative narcotics, change in appetite and fluid intake all can affect your bowels.  In order to avoid complications following surgery, here are some recommendations in order to help you during your recovery period.  Colace (docusate) - Pick up an over-the-counter form of Colace or another stool softener and take twice a day as long as you are requiring postoperative pain medications.  Take with a full glass of water daily.  If you experience loose stools or diarrhea, hold the colace until you stool forms back up.  If your symptoms do not get better within 1 week or if they get worse, check with your doctor.  Dulcolax (bisacodyl) - Pick up over-the-counter and take as directed by the product packaging as needed to assist with the movement of your bowels.  Take with a  full glass of water.  Use this product as needed if not relieved by Colace only.  ° °MiraLax (polyethylene glycol) - Pick up over-the-counter to have on hand.  MiraLax is a solution that will increase the amount of water in your bowels to assist with bowel movements.  Take as directed and can mix with a glass of water, juice, soda, coffee, or tea.  Take if you go more than two days without a movement. °Do not use MiraLax more than once per day. Call your  doctor if you are still constipated or irregular after using this medication for 7 days in a row. ° °If you continue to have problems with postoperative constipation, please contact the office for further assistance and recommendations.  If you experience "the worst abdominal pain ever" or develop nausea or vomiting, please contact the office immediatly for further recommendations for treatment. ° °ITCHING ° If you experience itching with your medications, try taking only a single pain pill, or even half a pain pill at a time.  You can also use Benadryl over the counter for itching or also to help with sleep.  ° °TED HOSE STOCKINGS °Wear the elastic stockings on both legs for six weeks following surgery during the day but you may remove then at night for sleeping. ° °MEDICATIONS °See your medication summary on the “After Visit Summary” that the nursing staff will review with you prior to discharge.  You may have some home medications which will be placed on hold until you complete the course of blood thinner medication.  It is important for you to complete the blood thinner medication as prescribed by your surgeon.  Continue your approved medications as instructed at time of discharge. ° °PRECAUTIONS °If you experience chest pain or shortness of breath - call 911 immediately for transfer to the hospital emergency department.  °If you develop a fever greater that 101 F, purulent drainage from wound, increased redness or drainage from wound, foul odor from the wound/dressing, or calf pain - CONTACT YOUR SURGEON.   °                                                °FOLLOW-UP APPOINTMENTS °Make sure you keep all of your appointments after your operation with your surgeon and caregivers. You should call the office at the above phone number and make an appointment for approximately two weeks after the date of your surgery or on the date instructed by your surgeon outlined in the "After Visit Summary". ° °RANGE OF MOTION  AND STRENGTHENING EXERCISES  °These exercises are designed to help you keep full movement of your hip joint. Follow your caregiver's or physical therapist's instructions. Perform all exercises about fifteen times, three times per day or as directed. Exercise both hips, even if you have had only one joint replacement. These exercises can be done on a training (exercise) mat, on the floor, on a table or on a bed. Use whatever works the best and is most comfortable for you. Use music or television while you are exercising so that the exercises are a pleasant break in your day. This will make your life better with the exercises acting as a break in routine you can look forward to.  °Lying on your back, slowly slide your foot toward your buttocks, raising your knee up off the floor. Then   slowly slide your foot back down until your leg is straight again.  °Lying on your back spread your legs as far apart as you can without causing discomfort.  °Lying on your side, raise your upper leg and foot straight up from the floor as far as is comfortable. Slowly lower the leg and repeat.  °Lying on your back, tighten up the muscle in the front of your thigh (quadriceps muscles). You can do this by keeping your leg straight and trying to raise your heel off the floor. This helps strengthen the largest muscle supporting your knee.  °Lying on your back, tighten up the muscles of your buttocks both with the legs straight and with the knee bent at a comfortable angle while keeping your heel on the floor.  ° °IF YOU ARE TRANSFERRED TO A SKILLED REHAB FACILITY °If the patient is transferred to a skilled rehab facility following release from the hospital, a list of the current medications will be sent to the facility for the patient to continue.  When discharged from the skilled rehab facility, please have the facility set up the patient's Home Health Physical Therapy prior to being released. Also, the skilled facility will be responsible  for providing the patient with their medications at time of release from the facility to include their pain medication, the muscle relaxants, and their blood thinner medication. If the patient is still at the rehab facility at time of the two week follow up appointment, the skilled rehab facility will also need to assist the patient in arranging follow up appointment in our office and any transportation needs. ° °MAKE SURE YOU:  °Understand these instructions.  °Get help right away if you are not doing well or get worse.  ° ° °Pick up stool softner and laxative for home use following surgery while on pain medications. °Continue to use ice for pain and swelling after surgery. °Do not use any lotions or creams on the incision until instructed by your surgeon. ° °

## 2018-05-13 NOTE — Progress Notes (Signed)
   Subjective: 1 Day Post-Op Procedure(s) (LRB): TOTAL HIP ARTHROPLASTY ANTERIOR APPROACH (Left) Patient reports pain as 3 on 0-10 scale.   Patient is well, and has had no acute complaints or problems Denies any CP, SOB, ABD pain. We will continue therapy today.  Plan is to go Home after hospital stay.  Objective: Vital signs in last 24 hours: Temp:  [97 F (36.1 C)-97.8 F (36.6 C)] 97.5 F (36.4 C) (08/02 0411) Pulse Rate:  [46-72] 58 (08/02 0411) Resp:  [8-18] 17 (08/02 0411) BP: (89-137)/(54-81) 112/62 (08/02 0411) SpO2:  [96 %-100 %] 100 % (08/02 0411)  Intake/Output from previous day: 08/01 0701 - 08/02 0700 In: 1970 [P.O.:720; I.V.:1200] Out: 3050 [Urine:2450; Blood:600] Intake/Output this shift: No intake/output data recorded.  Recent Labs    05/13/18 0422  HGB 10.0*   Recent Labs    05/13/18 0422  WBC 9.5  RBC 3.14*  HCT 29.4*  PLT 188   Recent Labs    05/13/18 0422  NA 143  K 4.2  CL 114*  CO2 26  BUN 16  CREATININE 0.67  GLUCOSE 136*  CALCIUM 8.7*   No results for input(s): LABPT, INR in the last 72 hours.  EXAM General - Patient is Alert, Appropriate and Oriented Extremity - Neurovascular intact Sensation intact distally Intact pulses distally Dorsiflexion/Plantar flexion intact No cellulitis present Compartment soft Dressing - dressing C/D/I and no drainage, wound vac intact with out drainage.  Hemovac intact. Motor Function - intact, moving foot and toes well on exam.   Past Medical History:  Diagnosis Date  . Arthritis   . CHF (congestive heart failure) (HCC)   . Dyspnea    not so much now since weight loss  . GERD (gastroesophageal reflux disease)   . Heart murmur    not being followed or treated. has lost weight with improvement of murmur  . History of blood transfusion    unsure of when she had transfusion but states she had a terrible reaction and needed medication  . Hypertension   . Hypothyroidism    not on meds at  this time  . Sleep apnea    does not use cpap since weight loss    Assessment/Plan:   1 Day Post-Op Procedure(s) (LRB): TOTAL HIP ARTHROPLASTY ANTERIOR APPROACH (Left) Active Problems:   Status post total hip replacement, left  Estimated body mass index is 41.09 kg/m as calculated from the following:   Height as of this encounter: 5\' 4"  (1.626 m).   Weight as of this encounter: 108.6 kg (239 lb 6 oz). Advance diet Up with therapy  Needs BM Pain well controlled VSS  Labs stable, recheck in the am CM to assist with discharge to home with home health PT    DVT Prophylaxis - Aspirin, TED hose and SCDs Weight-Bearing as tolerated to left leg   T. , PA-C [facility] 05/13/2018, 7:50 AM

## 2018-05-13 NOTE — Progress Notes (Signed)
Clinical Social Worker (CSW) received SNF consult. PT is recommending home health. RN case manager aware of above. Please reconsult if future social work needs arise. CSW signing off.   Amare Kontos, LCSW [phone] 

## 2018-05-14 LAB — CBC
HEMATOCRIT: 30.6 % — AB (ref 35.0–47.0)
Hemoglobin: 10.3 g/dL — ABNORMAL LOW (ref 12.0–16.0)
MCH: 31.7 pg (ref 26.0–34.0)
MCHC: 33.5 g/dL (ref 32.0–36.0)
MCV: 94.6 fL (ref 80.0–100.0)
Platelets: 168 10*3/uL (ref 150–440)
RBC: 3.24 MIL/uL — ABNORMAL LOW (ref 3.80–5.20)
RDW: 14.7 % — ABNORMAL HIGH (ref 11.5–14.5)
WBC: 6.5 10*3/uL (ref 3.6–11.0)

## 2018-05-14 LAB — BASIC METABOLIC PANEL
Anion gap: 4 — ABNORMAL LOW (ref 5–15)
BUN: 13 mg/dL (ref 6–20)
CO2: 26 mmol/L (ref 22–32)
Calcium: 8.7 mg/dL — ABNORMAL LOW (ref 8.9–10.3)
Chloride: 111 mmol/L (ref 98–111)
Creatinine, Ser: 0.74 mg/dL (ref 0.44–1.00)
Glucose, Bld: 107 mg/dL — ABNORMAL HIGH (ref 70–99)
POTASSIUM: 3.7 mmol/L (ref 3.5–5.1)
Sodium: 141 mmol/L (ref 135–145)

## 2018-05-14 MED ORDER — BISACODYL 10 MG RE SUPP
10.0000 mg | Freq: Once | RECTAL | Status: DC
  Filled 2018-05-14: qty 1

## 2018-05-14 NOTE — Progress Notes (Signed)
Physical Therapy Treatment Patient Details Name: Alexandria Moore MRN: [id number] DOB: [birth year] Today's Date: 05/14/2018    History of Present Illness Pt is 57 yo woman s/p L Total Ant hip surgery. PMH of CHF, HTN, Sleep apnea, Arthritis. PWB 50% for 1 mo post op    PT Comments    Participated in exercises as described below.  Pt given and reviewed HEP packet.  Was able to transition eo EOB with min assist then stand and ambulate around unit x 1 with walker and supervision/min guard.  No LOB or buckling.  Slow but steady gait.  Pt with no further questions or concerns.  She declined stair review as she felt confident in her abilities to navigate them at home.   Follow Up Recommendations  Home health PT     Equipment Recommendations  Rolling walker with 5" wheels;3in1 (PT)    Recommendations for Other Services       Precautions / Restrictions Precautions Precautions: Fall Restrictions Weight Bearing Restrictions: Yes LLE Weight Bearing: Partial weight bearing LLE Partial Weight Bearing Percentage or Pounds: 50 Other Position/Activity Restrictions: LLE PWB 50%    Mobility  Bed Mobility Overal bed mobility: Needs Assistance Bed Mobility: Supine to Sit     Supine to sit: Min assist        Transfers Overall transfer level: Needs assistance Equipment used: Rolling walker (2 wheeled) Transfers: Sit to/from Stand Sit to Stand: Supervision            Ambulation/Gait   Gait Distance (Feet): 200 Feet Assistive device: Rolling walker (2 wheeled) Gait Pattern/deviations: Step-through pattern;Decreased step length - left;Decreased stance time - left Gait velocity: decreased   General Gait Details: 50% WB   Stairs             Wheelchair Mobility    Modified Rankin (Stroke Patients Only)       Balance Overall balance assessment: Needs assistance Sitting-balance support: Feet supported Sitting balance-Leahy Scale: Good     Standing balance  support: No upper extremity supported;Bilateral upper extremity supported Standing balance-Leahy Scale: Good                              Cognition Arousal/Alertness: Awake/alert Behavior During Therapy: WFL for tasks assessed/performed Overall Cognitive Status: Within Functional Limits for tasks assessed                                        Exercises Other Exercises Other Exercises: LLE HEP packet and review.  ankle pumps, quad and glut sets, heel slides, ab/add, SAQ x 10 A/AAROM as needed.    General Comments        Pertinent Vitals/Pain Pain Assessment: 0-10 Pain Score: 3  Pain Location: L hip Pain Descriptors / Indicators: Aching;Burning;Stabbing Pain Intervention(s): Limited activity within patient's tolerance;Monitored during session    Home Living                      Prior Function            PT Goals (current goals can now be found in the care plan section) Progress towards PT goals: Progressing toward goals    Frequency    BID      PT Plan Current plan remains appropriate    Co-evaluation  AM-PAC PT "6 Clicks" Daily Activity  Outcome Measure  Difficulty turning over in bed (including adjusting bedclothes, sheets and blankets)?: A Little Difficulty moving from lying on back to sitting on the side of the bed? : A Little Difficulty sitting down on and standing up from a chair with arms (e.g., wheelchair, bedside commode, etc,.)?: None Help needed moving to and from a bed to chair (including a wheelchair)?: A Little Help needed walking in hospital room?: A Little Help needed climbing 3-5 steps with a railing? : A Little 6 Click Score: 19    End of Session Equipment Utilized During Treatment: Gait belt Activity Tolerance: Patient tolerated treatment well Patient left: in chair;with chair alarm set;with call bell/phone within reach   Pain - Right/Left: Left Pain - part of body: Hip      Time: 5427-0623 PT Time Calculation (min) (ACUTE ONLY): 23 min  Charges:  $Gait Training: 8-22 mins $Therapeutic Exercise: 8-22 mins                     Danielle Dess, PTA 05/14/18, 9:44 AM

## 2018-05-14 NOTE — Progress Notes (Signed)
Patient is being discharged home with HH/PT. DC & RX instructions given and patient acknowledged understanding. IV removed. Belongings packed. Husband is here to transport home.

## 2018-05-14 NOTE — Plan of Care (Signed)
  Problem: Activity: Goal: Risk for activity intolerance will decrease Outcome: Progressing   Problem: Elimination: Goal: Will not experience complications related to bowel motility Outcome: Progressing   Problem: Pain Managment: Goal: General experience of comfort will improve Outcome: Progressing   

## 2018-05-14 NOTE — Care Management Note (Signed)
Case Management Note  Patient Details  Name: [name] MRN: [id number] Date of Birth: Mar 12, 1961  Subjective/Objective:     Patient to be discharged per MD order. Orders in place for home health services. Orders in place for 3 in 1 and rolling walker. Patient has no DMe in the home but feels she would benefit from this ordered equipment. [facility]. Per patient preference referral for home health placed with [facility]. Spoke with Rosey Bath from [facility] who accepted the patient for PT services. Patient lives at home with her significant other and has no other RNCM needs. Family to provide discharge transport. Buddy Duty RN BSN RNCM [phone]                Action/Plan:   Expected Discharge Date:  05/14/18               Expected Discharge Plan:     In-House Referral:     Discharge planning Services  CM Consult  Post Acute Care Choice:  Durable Medical Equipment, Home Health Choice offered to:  Patient  DME Arranged:  3-N-1, Walker rolling DME Agency:  [facility]  HH Arranged:  PT HH Agency:  [facility] ([facility]  Status of Service:  Completed, signed off  If discussed at [employer], dates discussed:    Additional Comments:  Virgel Manifold, RN 05/14/2018, 8:54 AM

## 2018-05-14 NOTE — Progress Notes (Signed)
   Subjective: 2 Days Post-Op Procedure(s) (LRB): TOTAL HIP ARTHROPLASTY ANTERIOR APPROACH (Left) Patient reports pain as mild.   Patient is well, and has had no acute complaints or problems Denies any CP, SOB, ABD pain. We will continue therapy today.  Plan is to go Home after hospital stay.  Objective: Vital signs in last 24 hours: Temp:  [97.5 F (36.4 C)-98 F (36.7 C)] 98 F (36.7 C) (08/02 2338) Pulse Rate:  [52-62] 61 (08/03 0621) Resp:  [18] 18 (08/02 1656) BP: (91-127)/(57-72) 127/72 (08/03 0621) SpO2:  [98 %-100 %] 100 % (08/03 0621)  Intake/Output from previous day: 08/02 0701 - 08/03 0700 In: 3010 [P.O.:960; I.V.:2050] Out: 40 [Drains:40] Intake/Output this shift: No intake/output data recorded.  Recent Labs    05/13/18 0422 05/14/18 0518  HGB 10.0* 10.3*   Recent Labs    05/13/18 0422 05/14/18 0518  WBC 9.5 6.5  RBC 3.14* 3.24*  HCT 29.4* 30.6*  PLT 188 168   Recent Labs    05/13/18 0422 05/14/18 0518  NA 143 141  K 4.2 3.7  CL 114* 111  CO2 26 26  BUN 16 13  CREATININE 0.67 0.74  GLUCOSE 136* 107*  CALCIUM 8.7* 8.7*   No results for input(s): LABPT, INR in the last 72 hours.  EXAM General - Patient is Alert, Appropriate and Oriented Extremity - Neurovascular intact Sensation intact distally Intact pulses distally Dorsiflexion/Plantar flexion intact No cellulitis present Compartment soft Dressing - dressing C/D/I and no drainage, wound vac intact with out drainage.  Hemovac removed Motor Function - intact, moving foot and toes well on exam.   Past Medical History:  Diagnosis Date  . Arthritis   . CHF (congestive heart failure) (HCC)   . Dyspnea    not so much now since weight loss  . GERD (gastroesophageal reflux disease)   . Heart murmur    not being followed or treated. has lost weight with improvement of murmur  . History of blood transfusion    unsure of when she had transfusion but states she had a terrible reaction and  needed medication  . Hypertension   . Hypothyroidism    not on meds at this time  . Sleep apnea    does not use cpap since weight loss    Assessment/Plan:   2 Days Post-Op Procedure(s) (LRB): TOTAL HIP ARTHROPLASTY ANTERIOR APPROACH (Left) Active Problems:   Status post total hip replacement, left  Estimated body mass index is 41.09 kg/m as calculated from the following:   Height as of this encounter: 5\' 4"  (1.626 m).   Weight as of this encounter: 108.6 kg (239 lb 6 oz). Advance diet Up with therapy  Needs BM Pain well controlled VSS  Labs stable CM to assist with discharge to home with home health PT Discharge home with home health PT today pending bowel movement and good progress with PT    DVT Prophylaxis - Aspirin, TED hose and SCDs Weight-Bearing as tolerated to left leg   T. , PA-C [facility] 05/14/2018, 6:31 AM

## 2018-05-16 LAB — SURGICAL PATHOLOGY

## 2018-05-17 ENCOUNTER — Telehealth: Payer: Self-pay

## 2018-05-17 NOTE — Telephone Encounter (Signed)
EMMI Follow-up: It was noted on the report that patient had a question of who to contact if she had a change in condition.  I talked with Alexandria Moore and she her PCP was new to her as the other MD had left the practice.  She has already made an appointment for next week to follow up with Alexandria Moore.  I let her know there would be a 2nd automated call with a different series of questions and to let us know if she had any concerns at that time.  No other needs noted for today.

## 2018-11-08 ENCOUNTER — Other Ambulatory Visit: Payer: Self-pay | Admitting: Family

## 2018-11-08 DIAGNOSIS — Z1231 Encounter for screening mammogram for malignant neoplasm of breast: Secondary | ICD-10-CM

## 2018-12-06 ENCOUNTER — Ambulatory Visit
Admission: RE | Admit: 2018-12-06 | Discharge: 2018-12-06 | Disposition: A | Discharge status: Home / Self Care | Payer: 59 | Source: Ambulatory Visit | Attending: Family | Admitting: Family

## 2018-12-06 DIAGNOSIS — Z1231 Encounter for screening mammogram for malignant neoplasm of breast: Secondary | ICD-10-CM | POA: Insufficient documentation

## 2019-02-15 DIAGNOSIS — R600 Localized edema: Secondary | ICD-10-CM | POA: Insufficient documentation

## 2019-04-25 DIAGNOSIS — M159 Polyosteoarthritis, unspecified: Secondary | ICD-10-CM | POA: Insufficient documentation

## 2019-04-25 DIAGNOSIS — M170 Bilateral primary osteoarthritis of knee: Secondary | ICD-10-CM | POA: Insufficient documentation

## 2019-08-14 ENCOUNTER — Other Ambulatory Visit: Payer: Self-pay | Admitting: Sports Medicine

## 2019-08-14 DIAGNOSIS — M25572 Pain in left ankle and joints of left foot: Secondary | ICD-10-CM

## 2019-08-14 DIAGNOSIS — G8929 Other chronic pain: Secondary | ICD-10-CM

## 2019-08-14 DIAGNOSIS — M76829 Posterior tibial tendinitis, unspecified leg: Secondary | ICD-10-CM

## 2019-08-23 ENCOUNTER — Ambulatory Visit: Payer: 59

## 2019-09-03 ENCOUNTER — Ambulatory Visit: Admission: RE | Admit: 2019-09-03 | Payer: 59 | Source: Ambulatory Visit

## 2019-11-13 DIAGNOSIS — U071 COVID-19: Secondary | ICD-10-CM

## 2019-11-13 DIAGNOSIS — J189 Pneumonia, unspecified organism: Secondary | ICD-10-CM

## 2019-11-13 HISTORY — DX: Pneumonia, unspecified organism: J18.9

## 2019-11-13 HISTORY — DX: COVID-19: U07.1

## 2019-12-05 ENCOUNTER — Other Ambulatory Visit: Payer: Self-pay

## 2019-12-05 ENCOUNTER — Inpatient Hospital Stay: Payer: No Typology Code available for payment source

## 2019-12-05 ENCOUNTER — Inpatient Hospital Stay
Admission: EM | Admit: 2019-12-05 | Discharge: 2019-12-10 | DRG: 177 | Disposition: A | Discharge status: Home Health | Payer: No Typology Code available for payment source | Attending: Family Medicine | Admitting: Family Medicine

## 2019-12-05 ENCOUNTER — Emergency Department: Payer: No Typology Code available for payment source

## 2019-12-05 ENCOUNTER — Encounter: Payer: Self-pay | Admitting: Emergency Medicine

## 2019-12-05 DIAGNOSIS — J1282 Pneumonia due to coronavirus disease 2019: Secondary | ICD-10-CM | POA: Diagnosis present

## 2019-12-05 DIAGNOSIS — D696 Thrombocytopenia, unspecified: Secondary | ICD-10-CM | POA: Diagnosis present

## 2019-12-05 DIAGNOSIS — K219 Gastro-esophageal reflux disease without esophagitis: Secondary | ICD-10-CM | POA: Diagnosis present

## 2019-12-05 DIAGNOSIS — E039 Hypothyroidism, unspecified: Secondary | ICD-10-CM | POA: Diagnosis present

## 2019-12-05 DIAGNOSIS — I5032 Chronic diastolic (congestive) heart failure: Secondary | ICD-10-CM | POA: Diagnosis present

## 2019-12-05 DIAGNOSIS — Z79899 Other long term (current) drug therapy: Secondary | ICD-10-CM

## 2019-12-05 DIAGNOSIS — Z87891 Personal history of nicotine dependence: Secondary | ICD-10-CM

## 2019-12-05 DIAGNOSIS — I10 Essential (primary) hypertension: Secondary | ICD-10-CM | POA: Diagnosis present

## 2019-12-05 DIAGNOSIS — U071 COVID-19: Principal | ICD-10-CM | POA: Diagnosis present

## 2019-12-05 DIAGNOSIS — I110 Hypertensive heart disease with heart failure: Secondary | ICD-10-CM | POA: Diagnosis present

## 2019-12-05 DIAGNOSIS — R7989 Other specified abnormal findings of blood chemistry: Secondary | ICD-10-CM | POA: Diagnosis present

## 2019-12-05 DIAGNOSIS — J9601 Acute respiratory failure with hypoxia: Secondary | ICD-10-CM | POA: Diagnosis present

## 2019-12-05 DIAGNOSIS — Z7982 Long term (current) use of aspirin: Secondary | ICD-10-CM

## 2019-12-05 DIAGNOSIS — Z79890 Hormone replacement therapy (postmenopausal): Secondary | ICD-10-CM

## 2019-12-05 DIAGNOSIS — R778 Other specified abnormalities of plasma proteins: Secondary | ICD-10-CM | POA: Diagnosis present

## 2019-12-05 DIAGNOSIS — Z6841 Body Mass Index (BMI) 40.0 and over, adult: Secondary | ICD-10-CM

## 2019-12-05 DIAGNOSIS — Z96642 Presence of left artificial hip joint: Secondary | ICD-10-CM | POA: Diagnosis present

## 2019-12-05 DIAGNOSIS — G4733 Obstructive sleep apnea (adult) (pediatric): Secondary | ICD-10-CM | POA: Diagnosis present

## 2019-12-05 DIAGNOSIS — I509 Heart failure, unspecified: Secondary | ICD-10-CM

## 2019-12-05 LAB — CBC WITH DIFFERENTIAL/PLATELET
Abs Immature Granulocytes: 0.02 10*3/uL (ref 0.00–0.07)
Basophils Absolute: 0 10*3/uL (ref 0.0–0.1)
Basophils Relative: 0 %
Eosinophils Absolute: 0 10*3/uL (ref 0.0–0.5)
Eosinophils Relative: 0 %
HCT: 39.7 % (ref 36.0–46.0)
Hemoglobin: 13.1 g/dL (ref 12.0–15.0)
Immature Granulocytes: 0 %
Lymphocytes Relative: 9 %
Lymphs Abs: 0.6 10*3/uL — ABNORMAL LOW (ref 0.7–4.0)
MCH: 30.1 pg (ref 26.0–34.0)
MCHC: 33 g/dL (ref 30.0–36.0)
MCV: 91.3 fL (ref 80.0–100.0)
Monocytes Absolute: 0.4 10*3/uL (ref 0.1–1.0)
Monocytes Relative: 6 %
Neutro Abs: 5.2 10*3/uL (ref 1.7–7.7)
Neutrophils Relative %: 85 %
Platelets: 145 10*3/uL — ABNORMAL LOW (ref 150–400)
RBC: 4.35 MIL/uL (ref 3.87–5.11)
RDW: 13.9 % (ref 11.5–15.5)
WBC: 6.1 10*3/uL (ref 4.0–10.5)
nRBC: 0 % (ref 0.0–0.2)

## 2019-12-05 LAB — FERRITIN: Ferritin: 206 ng/mL (ref 11–307)

## 2019-12-05 LAB — BASIC METABOLIC PANEL
Anion gap: 11 (ref 5–15)
BUN: 11 mg/dL (ref 6–20)
CO2: 22 mmol/L (ref 22–32)
Calcium: 8.5 mg/dL — ABNORMAL LOW (ref 8.9–10.3)
Chloride: 104 mmol/L (ref 98–111)
Creatinine, Ser: 0.75 mg/dL (ref 0.44–1.00)
GFR calc Af Amer: >60 mL/min (ref >60)
GFR calc non Af Amer: >60 mL/min (ref >60)
Glucose, Bld: 106 mg/dL — ABNORMAL HIGH (ref 70–99)
Potassium: 3.7 mmol/L (ref 3.5–5.1)
Sodium: 137 mmol/L (ref 135–145)

## 2019-12-05 LAB — TROPONIN I (HIGH SENSITIVITY)
Troponin I (High Sensitivity): 32 ng/L — ABNORMAL HIGH (ref <18)
Troponin I (High Sensitivity): 31 ng/L — ABNORMAL HIGH (ref <18)
Troponin I (High Sensitivity): 29 ng/L — ABNORMAL HIGH (ref <18)
Troponin I (High Sensitivity): 29 ng/L — ABNORMAL HIGH (ref <18)

## 2019-12-05 LAB — ABO/RH: ABO/RH(D): A POS

## 2019-12-05 LAB — C-REACTIVE PROTEIN: CRP: 10.9 mg/dL — ABNORMAL HIGH (ref <1.0)

## 2019-12-05 LAB — HIV ANTIBODY (ROUTINE TESTING W REFLEX): HIV Screen 4th Generation wRfx: NONREACTIVE

## 2019-12-05 LAB — FIBRIN DERIVATIVES D-DIMER (ARMC ONLY): Fibrin derivatives D-dimer (ARMC): 1059.85 ng/mL (FEU) — ABNORMAL HIGH (ref 0.00–499.00)

## 2019-12-05 LAB — POC SARS CORONAVIRUS 2 AG: SARS Coronavirus 2 Ag: POSITIVE — AB

## 2019-12-05 MED ORDER — LOSARTAN POTASSIUM 50 MG PO TABS
100.0000 mg | ORAL_TABLET | Freq: Every day | ORAL | Status: DC
  Administered 2019-12-05 – 2019-12-10 (×6): 100 mg via ORAL
  Filled 2019-12-05 (×6): qty 2

## 2019-12-05 MED ORDER — SODIUM CHLORIDE 0.9 % IV SOLN
100.0000 mg | Freq: Every day | INTRAVENOUS | Status: AC
  Administered 2019-12-06 – 2019-12-09 (×4): 100 mg via INTRAVENOUS
  Filled 2019-12-05 (×4): qty 20

## 2019-12-05 MED ORDER — ZINC SULFATE 220 (50 ZN) MG PO CAPS
220.0000 mg | ORAL_CAPSULE | Freq: Every day | ORAL | Status: DC
  Administered 2019-12-05 – 2019-12-10 (×6): 220 mg via ORAL
  Filled 2019-12-05 (×6): qty 1

## 2019-12-05 MED ORDER — GUAIFENESIN-DM 100-10 MG/5ML PO SYRP
10.0000 mL | ORAL_SOLUTION | ORAL | Status: DC | PRN
  Administered 2019-12-05 – 2019-12-08 (×6): 10 mL via ORAL
  Administered 2019-12-08 (×2): 5 mL via ORAL
  Administered 2019-12-08 – 2019-12-10 (×4): 10 mL via ORAL
  Filled 2019-12-05 (×13): qty 10

## 2019-12-05 MED ORDER — AMLODIPINE BESYLATE 10 MG PO TABS
10.0000 mg | ORAL_TABLET | Freq: Every day | ORAL | Status: DC
  Administered 2019-12-05 – 2019-12-10 (×6): 10 mg via ORAL
  Filled 2019-12-05 (×6): qty 1

## 2019-12-05 MED ORDER — CARVEDILOL 25 MG PO TABS
25.0000 mg | ORAL_TABLET | Freq: Two times a day (BID) | ORAL | Status: DC
  Administered 2019-12-05: 25 mg via ORAL
  Filled 2019-12-05 (×2): qty 1

## 2019-12-05 MED ORDER — ONDANSETRON HCL 4 MG/2ML IJ SOLN
4.0000 mg | Freq: Once | INTRAMUSCULAR | Status: AC
  Administered 2019-12-05: 09:00:00 4 mg via INTRAVENOUS
  Filled 2019-12-05: qty 2

## 2019-12-05 MED ORDER — SODIUM CHLORIDE 0.9 % IV SOLN
500.0000 mg | INTRAVENOUS | Status: DC
  Administered 2019-12-05: 500 mg via INTRAVENOUS
  Filled 2019-12-05 (×2): qty 500

## 2019-12-05 MED ORDER — SODIUM CHLORIDE 0.9% FLUSH
3.0000 mL | Freq: Two times a day (BID) | INTRAVENOUS | Status: DC
  Administered 2019-12-05 – 2019-12-10 (×11): 3 mL via INTRAVENOUS

## 2019-12-05 MED ORDER — DEXAMETHASONE SODIUM PHOSPHATE 10 MG/ML IJ SOLN
10.0000 mg | Freq: Once | INTRAMUSCULAR | Status: AC
  Administered 2019-12-05: 11:00:00 10 mg via INTRAVENOUS
  Filled 2019-12-05: qty 1

## 2019-12-05 MED ORDER — SODIUM CHLORIDE 0.9% FLUSH: 3.0000 mL | INTRAVENOUS | Status: DC | PRN

## 2019-12-05 MED ORDER — IOHEXOL 350 MG/ML SOLN
75.0000 mL | Freq: Once | INTRAVENOUS | Status: AC | PRN
  Administered 2019-12-05: 20:00:00 75 mL via INTRAVENOUS

## 2019-12-05 MED ORDER — ENOXAPARIN SODIUM 40 MG/0.4ML ~~LOC~~ SOLN
40.0000 mg | Freq: Two times a day (BID) | SUBCUTANEOUS | Status: DC
  Administered 2019-12-05 – 2019-12-10 (×11): 40 mg via SUBCUTANEOUS
  Filled 2019-12-05 (×11): qty 0.4

## 2019-12-05 MED ORDER — ASCORBIC ACID 500 MG PO TABS
500.0000 mg | ORAL_TABLET | Freq: Every day | ORAL | Status: DC
  Administered 2019-12-05 – 2019-12-10 (×6): 500 mg via ORAL
  Filled 2019-12-05 (×6): qty 1

## 2019-12-05 MED ORDER — GABAPENTIN 300 MG PO CAPS
300.0000 mg | ORAL_CAPSULE | Freq: Three times a day (TID) | ORAL | Status: DC
  Administered 2019-12-05 – 2019-12-10 (×15): 300 mg via ORAL
  Filled 2019-12-05 (×15): qty 1

## 2019-12-05 MED ORDER — ENOXAPARIN SODIUM 40 MG/0.4ML ~~LOC~~ SOLN: 40.0000 mg | SUBCUTANEOUS | Status: DC

## 2019-12-05 MED ORDER — SODIUM CHLORIDE 0.9 % IV SOLN
1.0000 g | INTRAVENOUS | Status: DC
  Administered 2019-12-05: 14:00:00 1 g via INTRAVENOUS
  Filled 2019-12-05: qty 10
  Filled 2019-12-05: qty 1

## 2019-12-05 MED ORDER — ACETAMINOPHEN 500 MG PO TABS
1000.0000 mg | ORAL_TABLET | Freq: Four times a day (QID) | ORAL | Status: DC
  Administered 2019-12-05 – 2019-12-10 (×21): 1000 mg via ORAL
  Filled 2019-12-05 (×21): qty 2

## 2019-12-05 MED ORDER — SODIUM CHLORIDE 0.9 % IV SOLN: 250.0000 mL | INTRAVENOUS | Status: DC | PRN

## 2019-12-05 MED ORDER — ALBUTEROL SULFATE HFA 108 (90 BASE) MCG/ACT IN AERS
2.0000 | INHALATION_SPRAY | Freq: Once | RESPIRATORY_TRACT | Status: AC
  Administered 2019-12-05: 11:00:00 2 via RESPIRATORY_TRACT
  Filled 2019-12-05: qty 6.7

## 2019-12-05 MED ORDER — SODIUM CHLORIDE 0.9 % IV SOLN
200.0000 mg | Freq: Once | INTRAVENOUS | Status: AC
  Administered 2019-12-05: 11:00:00 200 mg via INTRAVENOUS
  Filled 2019-12-05: qty 200

## 2019-12-05 MED ORDER — CLONIDINE HCL 0.1 MG PO TABS
0.1000 mg | ORAL_TABLET | Freq: Two times a day (BID) | ORAL | Status: DC
  Administered 2019-12-05: 13:00:00 0.1 mg via ORAL
  Filled 2019-12-05 (×2): qty 1

## 2019-12-05 MED ORDER — OXYCODONE HCL 5 MG PO TABS
5.0000 mg | ORAL_TABLET | ORAL | Status: DC | PRN
  Administered 2019-12-05: 10 mg via ORAL
  Administered 2019-12-06: 10:00:00 5 mg via ORAL
  Filled 2019-12-05: qty 1
  Filled 2019-12-05: qty 2

## 2019-12-05 NOTE — Progress Notes (Signed)
Temp 101.8 upon admission to unit. Pt had 1000mg  of Tylenol scheduled for 1200 and was administered that. Will recheck temp in one hour and continue to monitor.

## 2019-12-05 NOTE — ED Triage Notes (Signed)
Pt in via [facility] from work, reports acute onset shortness of breath, chest discomfort, sore throat, all beginning this morning upon waking.  Pt with recent exposure to Covid+ coworker.

## 2019-12-05 NOTE — Assessment & Plan Note (Signed)
Continue pt pn her amlodipine/clonidine/lossartan and coreg.

## 2019-12-05 NOTE — ED Provider Notes (Signed)
[facility] Emergency Department Provider Note   ____________________________________________   First MD Initiated Contact with Patient 12/05/19 0827     (approximate)  I have reviewed the triage vital signs and the nursing notes.   HISTORY  Chief Complaint Shortness of Breath    HPI Alexandria Moore is a 59 y.o. female with possible history of hypertension, hypothyroidism, GERD, and CHF who presents to the ED complaining of shortness of breath.  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  Patient then started feeling bad yesterday with generalized weakness, malaise, fevers, cough, chest pain, and shortness of breath.  She has had a nonproductive cough and occasional burning in the center of her chest that seems to come up into her throat.  She reports a fever as high as 101.2 earlier this morning, has not taken any Tylenol or ibuprofen.  She has not yet been tested for COVID-19.        Past Medical History:  Diagnosis Date  . Arthritis   . CHF (congestive heart failure) ([place])   . Dyspnea    not so much now since weight loss  . GERD (gastroesophageal reflux disease)   . Heart murmur    not being followed or treated. has lost weight with improvement of murmur  . History of blood transfusion    unsure of when she had transfusion but states she had a terrible reaction and needed medication  . Hypertension   . Hypothyroidism    not on meds at this time  . Sleep apnea    does not use cpap since weight loss    Patient Active Problem List   Diagnosis Date Noted  . Essential hypertension 12/05/2019  . Pneumonia due to COVID-19 virus 12/05/2019  . Acute respiratory failure with hypoxia ([place]) 12/05/2019  . Congestive heart failure ([place]) 12/05/2019  . Thrombocytopenia ([place]) 12/05/2019  . Elevated troponin level 12/05/2019  . Status post total hip replacement, left 05/12/2018    Past Surgical  History:  Procedure Laterality Date  . [place]   x 1  . TOTAL HIP ARTHROPLASTY Left 05/12/2018   Procedure: TOTAL HIP ARTHROPLASTY ANTERIOR APPROACH;  Surgeon: Hessie Knows, MD;  Location: [facility] ORS;  Service: Orthopedics;  Laterality: Left;    Prior to Admission medications   Medication Sig Start Date End Date Taking? Authorizing Provider  amLODipine (NORVASC) 10 MG tablet Take 10 mg by mouth daily.    Yes [provider]  gabapentin (NEURONTIN) 300 MG capsule Take 300 mg by mouth 3 (three) times daily. 11/06/19  Yes [provider]  ibuprofen (ADVIL) 800 MG tablet Take 800 mg by mouth 3 (three) times daily as needed for moderate pain.  10/31/19  Yes [provider]  losartan (COZAAR) 100 MG tablet Take 100 mg by mouth daily.    Yes [provider]  SYNTHROID 88 MCG tablet Take 88 mcg by mouth daily. 06/16/19  Yes [provider]  acetaminophen (TYLENOL) 500 MG tablet Take 2 tablets (1,000 mg total) by mouth every 6 (six) hours. Patient taking differently: Take 1,000 mg by mouth every 6 (six) hours as needed for mild pain.  05/13/18   Duanne Guess, PA-C  acyclovir ointment (ZOVIRAX) 5 % Apply 1 application topically as needed (for cold sores).    [provider]  aspirin EC 325 MG EC tablet Take 1 tablet (325 mg total) by mouth daily with  breakfast. Patient not taking: Reported on 12/05/2019 05/14/18   Evon Slack, PA-C  carvedilol (COREG) 25 MG tablet Take 25 mg by mouth 2 (two) times daily with a meal.    [provider]  cloNIDine (CATAPRES) 0.1 MG tablet Take 0.1 mg by mouth 2 (two) times daily.    [provider]  coal tar (NEUTROGENA T-GEL) 0.5 % shampoo Apply topically at bedtime as needed.    [provider]  fluocinolone (SYNALAR) 0.025 % cream Apply topically 2 (two) times daily. Apply to scalp twice daily as needed for scaling and itching    [provider]  hydrochlorothiazide  (MICROZIDE) 12.5 MG capsule Take 12.5 mg by mouth daily. 09/11/19   [provider]  methocarbamol (ROBAXIN) 500 MG tablet Take 1 tablet (500 mg total) by mouth every 6 (six) hours as needed for muscle spasms. 05/13/18   Evon Slack, PA-C  omeprazole (PRILOSEC) 20 MG capsule Take 20 mg by mouth daily.    [provider]    Allergies Hydromorphone, Lisinopril, Meloxicam, and Adhesive [tape]  Family History  Problem Relation Age of Onset  . Breast cancer Neg Hx     Social History Social History   Tobacco Use  . Smoking status: Former Smoker    Packs/day: 0.25    Types: Cigarettes    Quit date: 2014    Years since quitting: 7.1  . Smokeless tobacco: Never Used  Substance Use Topics  . Alcohol use: Yes  . Drug use: Never    Review of Systems  Constitutional: Positive for fever/chills Eyes: No visual changes. ENT: No sore throat. Cardiovascular: Positive for chest pain. Respiratory: Positive for shortness of breath. Gastrointestinal: No abdominal pain.  Positive for nausea and vomiting.  Positive for diarrhea.  No constipation. Genitourinary: Negative for dysuria. Musculoskeletal: Negative for back pain. Skin: Negative for rash. Neurological: Negative for headaches, focal weakness or numbness.  ____________________________________________   PHYSICAL EXAM:  VITAL SIGNS: ED Triage Vitals  Enc Vitals Group     BP 12/05/19 0821 131/71     Pulse Rate 12/05/19 0821 93     Resp 12/05/19 0821 (!) 24     Temp 12/05/19 0821 99.4 F (37.4 C)     Temp Source 12/05/19 0821 Oral     SpO2 12/05/19 0820 90 %     Weight 12/05/19 0823 240 lb (108.9 kg)     Height 12/05/19 0823 5\' 4"  (1.626 m)     Head Circumference --      Peak Flow --      Pain Score 12/05/19 0822 7     Pain Loc --      Pain Edu? --      Excl. in GC? --     Constitutional: Alert and oriented. Eyes: Conjunctivae are normal. Head: Atraumatic. Nose: No  congestion/rhinnorhea. Mouth/Throat: Mucous membranes are moist. Neck: Normal ROM Cardiovascular: Normal rate, regular rhythm. Grossly normal heart sounds. Respiratory: Tachypneic with normal respiratory effort.  No retractions. Lungs CTAB. Gastrointestinal: Soft and nontender. No distention. Genitourinary: deferred Musculoskeletal: No lower extremity tenderness nor edema. Neurologic:  Normal speech and language. No gross focal neurologic deficits are appreciated. Skin:  Skin is warm, dry and intact. No rash noted. Psychiatric: Mood and affect are normal. Speech and behavior are normal.  ____________________________________________   LABS (all labs ordered are listed, but only abnormal results are displayed)  Labs Reviewed  CBC WITH DIFFERENTIAL/PLATELET - Abnormal; Notable for the following components:  Result Value   Platelets 145 (*)    Lymphs Abs 0.6 (*)    All other components within normal limits  BASIC METABOLIC PANEL - Abnormal; Notable for the following components:   Glucose, Bld 106 (*)    Calcium 8.5 (*)    All other components within normal limits  C-REACTIVE PROTEIN - Abnormal; Notable for the following components:   CRP 10.9 (*)    All other components within normal limits  FIBRIN DERIVATIVES D-DIMER ([facility] ONLY) - Abnormal; Notable for the following components:   Fibrin derivatives D-dimer ([facility]) 1,059.85 (*)    All other components within normal limits  POC SARS CORONAVIRUS 2 AG - Abnormal; Notable for the following components:   SARS Coronavirus 2 Ag POSITIVE (*)    All other components within normal limits  TROPONIN I (HIGH SENSITIVITY) - Abnormal; Notable for the following components:   Troponin I (High Sensitivity) 32 (*)    All other components within normal limits  TROPONIN I (HIGH SENSITIVITY) - Abnormal; Notable for the following components:   Troponin I (High Sensitivity) 31 (*)    All other components within normal limits  TROPONIN I (HIGH  SENSITIVITY) - Abnormal; Notable for the following components:   Troponin I (High Sensitivity) 29 (*)    All other components within normal limits  TROPONIN I (HIGH SENSITIVITY) - Abnormal; Notable for the following components:   Troponin I (High Sensitivity) 29 (*)    All other components within normal limits  FERRITIN  HIV ANTIBODY (ROUTINE TESTING W REFLEX)  POC SARS CORONAVIRUS 2 AG -  ED  ABO/RH   ____________________________________________  EKG  ED ECG REPORT I, Chesley Noon, the attending physician, personally viewed and interpreted this ECG.   Date: 12/05/2019  EKG Time: 8:47  Rate: 89  Rhythm: normal sinus rhythm  Axis: Normal  Intervals:Incomplete LBBB  ST&T Change: None   PROCEDURES  Procedure(s) performed (including Critical Care):  Procedures   ____________________________________________   INITIAL IMPRESSION / ASSESSMENT AND PLAN / ED COURSE       59 year old female presents to the ED with fevers, malaise, cough, chest pain, and shortness of breath since yesterday after being exposed to a coworker who tested positive for COVID-19.  I would have a very high suspicion for COVID-19 given her symptoms and exposure, will perform point-of-care testing.  Also plan to check EKG, labs, and chest x-ray, treat symptomatically with albuterol and Zofran.  O2 sats thus far have been borderline at 90% on room air, will have patient ambulate on a pulse ox and reassess.  Point-of-care COVID-19 testing is positive, patient noted to have drop in O2 sats to 70% on room air when ambulating.  We will give dose of steroids here in the ED and admit for further management.      ____________________________________________   FINAL CLINICAL IMPRESSION(S) / ED DIAGNOSES  Final diagnoses:  Pneumonia due to COVID-19 virus     ED Discharge Orders    None       Note:  This document was prepared using Dragon voice recognition software and may include unintentional  dictation errors.   Chesley Noon, MD 12/05/19 [phone]

## 2019-12-05 NOTE — Progress Notes (Signed)
Anticoagulation monitoring(Lovenox):  58yo  F ordered Lovenox 40 mg Q24h  Filed Weights   12/05/19 0823  Weight: 240 lb (108.9 kg)   BMI 41   Lab Results  Component Value Date   CREATININE 0.75 12/05/2019   CREATININE 0.74 05/14/2018   CREATININE 0.67 05/13/2018   Estimated Creatinine Clearance: 90.2 mL/min (by C-G formula based on SCr of 0.75 mg/dL). Hemoglobin & Hematocrit     Component Value Date/Time   HGB 13.1 12/05/2019 0847   HGB 14.0 09/05/2012 1538   HCT 39.7 12/05/2019 0847   HCT 43.5 09/05/2012 1538     Per Protocol for Patient with estCrcl > 30 ml/min and BMI > 40, will transition to Lovenox 40 mg Q12h.     Bari Mantis PharmD Clinical Pharmacist 12/05/2019

## 2019-12-05 NOTE — ED Notes (Signed)
Pt ambulatory saturation 78% on room air. Pt assisted back to bed and placed on 2L nasal cannula. Pt respiration rate now at 18, oxygen saturation 94% on 2L. MD [place] aware.

## 2019-12-05 NOTE — H&P (Signed)
History and Physical    Alexandria Moore Alexandria Moore DOB: 07/06/1961 DOA: 12/05/2019   PCP: Evie Lacks, NP   Patient coming from: Home  Chief Complaint: SOB  HPI: Alexandria Moore is a 59 y.o. female with medical history significant of high blood pressure, CHF presented to the emergency room with shortness of breath and fever.  Patient reports exposure to colleagues that was positive for COVID-19.  Her symptoms started yesterday.  They were mainly fever cough shortness of breath.  Pt has diarrheas yesterday. Patient also had a fever of 101.2 today.  ED Course:  Blood pressure 131/71, pulse 93, temperature 99.4 F (37.4 C), temperature source Oral, resp. rate (!) 24, height 5\' 4"  (1.626 m), weight 108.9 kg, SpO2 90 %.  Pt is AA/O and gives history.  Initials labs shows: Normal CBC with a mildly low platelet count of 145, CMP potassium of 3.7 normal creatinine, of 0.75 with a GFR of 90.  Mild troponin elevation of 32.  Initial imaging her chest x-ray shows patient has pneumonia. Patient is given albuterol, Decadron, Zofran, labs obtained CRP, ferritin, D-dimer, troponin, pending Patient's initial COVID-19 POC PCR is positive. Review of Systems: As per HPI otherwise 10 point review of systems negative.    Past Medical History:  Diagnosis Date  . Arthritis   . CHF (congestive heart failure) (HCC)   . Dyspnea    not so much now since weight loss  . GERD (gastroesophageal reflux disease)   . Heart murmur    not being followed or treated. has lost weight with improvement of murmur  . History of blood transfusion    unsure of when she had transfusion but states she had a terrible reaction and needed medication  . Hypertension   . Hypothyroidism    not on meds at this time  . Sleep apnea    does not use cpap since weight loss    Past Surgical History:  Procedure Laterality Date  . CESAREAN SECTION  1982   x 1  . 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 Her smoking use included cigarettes. She smoked 0.25 packs per day. She has never used smokeless tobacco. She reports current alcohol use. She reports that she does not use drugs.  Allergies  Allergen Reactions  . Hydromorphone Anaphylaxis and Shortness Of Breath  . Lisinopril Other (See Comments), Anaphylaxis, Nausea And Vomiting and Nausea Only    Other reaction(s): Abdominal Pain Other reaction(s): Abdominal Pain Other reaction(s): Abdominal Pain Other reaction(s): Abdominal Pain   . Meloxicam Anaphylaxis and Other (See Comments)    Weakness in Legs Other reaction(s): Other (See Comments) Weakness in Legs Weakness in Legs   . Adhesive [Tape] Other (See Comments)    Irritates skin. Paper tape is okay    Family History  Problem Relation Age of Onset  . Breast cancer Neg Hx      Prior to Admission medications   Medication Sig Start Date End Date Taking? Authorizing Provider  acetaminophen (TYLENOL) 500 MG tablet Take 2 tablets (1,000 mg total) by mouth every 6 (six) hours. 05/13/18   07/13/18, PA-C  acyclovir ointment (ZOVIRAX) 5 % Apply 1 application topically as needed (for cold sores).    [provider]  amLODipine (NORVASC) 10 MG tablet Take  10 mg by mouth daily.     [provider]  aspirin EC 325 MG EC tablet Take 1 tablet (325 mg total) by mouth daily with breakfast. 05/14/18   Duanne Guess, PA-C  carvedilol (COREG) 25 MG tablet Take 25 mg by mouth 2 (two) times daily with a meal.    [provider]  cloNIDine (CATAPRES) 0.1 MG tablet Take 0.1 mg by mouth 2 (two) times daily.    [provider]  coal tar (NEUTROGENA T-GEL) 0.5 % shampoo Apply topically at bedtime as needed.    [provider]  docusate sodium (COLACE) 100 MG capsule  Take 1 capsule (100 mg total) by mouth 2 (two) times daily. 05/13/18   Duanne Guess, PA-C  fluocinolone (SYNALAR) 0.025 % cream Apply topically 2 (two) times daily. Apply to scalp twice daily as needed for scaling and itching    [provider]  losartan (COZAAR) 100 MG tablet Take 100 mg by mouth daily.     [provider]  methocarbamol (ROBAXIN) 500 MG tablet Take 1 tablet (500 mg total) by mouth every 6 (six) hours as needed for muscle spasms. 05/13/18   Duanne Guess, PA-C  omeprazole (PRILOSEC) 20 MG capsule Take 20 mg by mouth daily.    [provider]  oxyCODONE (OXY IR/ROXICODONE) 5 MG immediate release tablet Take 1-2 tablets (5-10 mg total) by mouth every 4 (four) hours as needed for moderate pain (pain score 4-6). 05/13/18   Duanne Guess, PA-C    Physical Exam: Vitals:   12/05/19 0820 12/05/19 0821 12/05/19 0823  BP:  131/71   Pulse:  93   Resp:  (!) 24   Temp:  99.4 F (37.4 C)   TempSrc:  Oral   SpO2: 90% 90%   Weight:   108.9 kg  Height:   5\' 4"  (1.626 m)    Constitutional: NAD, calm, comfortable Vitals:   12/05/19 0820 12/05/19 0821 12/05/19 0823  BP:  131/71   Pulse:  93   Resp:  (!) 24   Temp:  99.4 F (37.4 C)   TempSrc:  Oral   SpO2: 90% 90%   Weight:   108.9 kg  Height:   5\' 4"  (1.626 m)   Eyes: PERRL, lids and conjunctivae normal ENMT: Mucous membranes are moist. Posterior pharynx clear of any exudate or lesions.Normal dentition.  Neck: normal, supple, no masses, no thyromegaly Respiratory: clear to auscultation bilaterally, no wheezing, no crackles. Normal respiratory effort. No accessory muscle use.  Cardiovascular: Regular rate and rhythm, no murmurs / rubs / gallops. No extremity edema. 2+ pedal pulses. No carotid bruits.  Abdomen: no tenderness, no masses palpated. No hepatosplenomegaly. Bowel sounds positive.  Musculoskeletal: no clubbing / cyanosis. No joint deformity upper and lower extremities. Good ROM, no  contractures. Normal muscle tone.  Skin: no rashes, lesions, ulcers. No induration Neurologic: CN 2-12 grossly intact. Sensation intact, DTR normal. Strength 5/5 in all 4.  Psychiatric: Normal judgment and insight. Alert and oriented x 3. Normal mood.   Labs on Admission: I have personally reviewed following labs and imaging studies  CBC: Recent Labs  Lab 12/05/19 0847  WBC 6.1  NEUTROABS 5.2  HGB 13.1  HCT 39.7  MCV 91.3  PLT 831*   Basic Metabolic Panel: Recent Labs  Lab 12/05/19 0847  NA 137  K 3.7  CL 104  CO2 22  GLUCOSE 106*  BUN 11  CREATININE 0.75  CALCIUM 8.5*   GFR:  Estimated Creatinine Clearance: 90.2 mL/min (by C-G formula based on SCr of 0.75 mg/dL). Urine analysis:    Component Value Date/Time   COLORURINE YELLOW (A) 05/04/2018 0834   APPEARANCEUR HAZY (A) 05/04/2018 0834   LABSPEC 1.025 05/04/2018 0834   PHURINE 5.0 05/04/2018 0834   GLUCOSEU NEGATIVE 05/04/2018 0834   HGBUR NEGATIVE 05/04/2018 0834   BILIRUBINUR NEGATIVE 05/04/2018 0834   KETONESUR NEGATIVE 05/04/2018 0834   PROTEINUR NEGATIVE 05/04/2018 0834   NITRITE NEGATIVE 05/04/2018 0834   LEUKOCYTESUR NEGATIVE 05/04/2018 0834    Radiological Exams on Admission: DG Chest Portable 1 View  Result Date: 12/05/2019 CLINICAL DATA:  Shortness of breath and chest discomfort EXAM: PORTABLE CHEST 1 VIEW COMPARISON:  December 02, 2014 FINDINGS: There is ill-defined airspace opacity in the right upper lobe as well as in both lower lung regions. There is slight consolidation in the left base. Heart is mildly enlarged with pulmonary vascularity normal. No adenopathy. There is degenerative change in the thoracic spine. No pneumothorax. IMPRESSION: Multifocal pneumonia.  Suspect atypical organism pneumonia. Stable cardiac enlargement.  No evident adenopathy. Electronically Signed   By: Bretta Bang III M.D.   On: 12/05/2019 09:17    EKG: Independently reviewed. Sinus rhythm  89.  Assessment/Plan Principal Problem:   Acute respiratory failure with hypoxia (HCC) Active Problems:   Congestive heart failure (HCC)   Pneumonia due to COVID-19 virus   Elevated troponin level   Essential hypertension   Thrombocytopenia (HCC)  Problem Assessment/Plan  Acute respiratory failure with hypoxia (HCC) Assessment & Plan Pt admitted with acute hypoxic respiratory failure due to covid-19 PNA . Pt is started on remdesivir/ decadron/ vit c and zinc regimen. Oxygen to keep sat 90% and above.  Pt admitted to telemetry unit for close monitoring as she also has h/o chf .   Essential hypertension Assessment & Plan Continue pt pn her amlodipine/clonidine/lossartan and coreg.   Congestive heart failure (HCC) Assessment & Plan Pt continued on her home regimen of amlodipine/coreg/clonidine and losartan.  Heart health diet.    Pneumonia due to COVID-19 virus Assessment & Plan Again pt to continue remdesivir and decadron. We will also start empiric rocephin and azithromycin for CAP coverage.    Thrombocytopenia (HCC) Assessment & Plan Platelet coutn today is 145. We will follow.   Elevated troponin level Assessment & Plan We will cycle troponin and 2 d echo.      DVT prophylaxis: Lovenox Code Status: Full Family Communication: None Disposition Plan: Home Consults called: None Admission status: Inpatient   Gertha Calkin MD [facility] If 7PM-7AM, please contact night-coverage [email or web address] Password [facility]  12/05/2019, 10:35 AM

## 2019-12-05 NOTE — Assessment & Plan Note (Signed)
Platelet coutn today is 145. We will follow.

## 2019-12-05 NOTE — Progress Notes (Signed)
Ch spoke with Pt over the phone. Pt sounded like she was struggling to speak, so Ch let Pt know that she will check again later.

## 2019-12-05 NOTE — Assessment & Plan Note (Signed)
Pt continued on her home regimen of amlodipine/coreg/clonidine and losartan.  Heart health diet.

## 2019-12-05 NOTE — Assessment & Plan Note (Signed)
Pt admitted with acute hypoxic respiratory failure due to covid-19 PNA . Pt is started on remdesivir/ decadron/ vit c and zinc regimen. Oxygen to keep sat 90% and above.  Pt admitted to telemetry unit for close monitoring as she also has h/o chf .

## 2019-12-05 NOTE — ED Notes (Signed)
Patients daughter requesting to speak with patient. Patient made aware and reports she will call and update daughter

## 2019-12-05 NOTE — ED Notes (Signed)
transporting pt to room #119 via stretcher

## 2019-12-05 NOTE — Assessment & Plan Note (Signed)
We will cycle troponin and 2 d echo.

## 2019-12-05 NOTE — Assessment & Plan Note (Signed)
Again pt to continue remdesivir and decadron. We will also start empiric rocephin and azithromycin for CAP coverage.

## 2019-12-06 ENCOUNTER — Inpatient Hospital Stay: Admit: 2019-12-06 | Payer: No Typology Code available for payment source

## 2019-12-06 ENCOUNTER — Inpatient Hospital Stay
Admit: 2019-12-06 | Discharge: 2019-12-06 | Disposition: A | Discharge status: Home / Self Care | Payer: No Typology Code available for payment source | Attending: Internal Medicine | Admitting: Internal Medicine

## 2019-12-06 LAB — COMPREHENSIVE METABOLIC PANEL
ALT: 16 U/L (ref 0–44)
AST: 22 U/L (ref 15–41)
Albumin: 3 g/dL — ABNORMAL LOW (ref 3.5–5.0)
Alkaline Phosphatase: 43 U/L (ref 38–126)
Anion gap: 7 (ref 5–15)
BUN: 14 mg/dL (ref 6–20)
CO2: 23 mmol/L (ref 22–32)
Calcium: 8.4 mg/dL — ABNORMAL LOW (ref 8.9–10.3)
Chloride: 108 mmol/L (ref 98–111)
Creatinine, Ser: 0.71 mg/dL (ref 0.44–1.00)
GFR calc Af Amer: >60 mL/min (ref >60)
GFR calc non Af Amer: >60 mL/min (ref >60)
Glucose, Bld: 116 mg/dL — ABNORMAL HIGH (ref 70–99)
Potassium: 3.9 mmol/L (ref 3.5–5.1)
Sodium: 138 mmol/L (ref 135–145)
Total Bilirubin: 0.5 mg/dL (ref 0.3–1.2)
Total Protein: 6.8 g/dL (ref 6.5–8.1)

## 2019-12-06 LAB — CBC WITH DIFFERENTIAL/PLATELET
Abs Immature Granulocytes: 0.01 10*3/uL (ref 0.00–0.07)
Basophils Absolute: 0 10*3/uL (ref 0.0–0.1)
Basophils Relative: 0 %
Eosinophils Absolute: 0 10*3/uL (ref 0.0–0.5)
Eosinophils Relative: 0 %
HCT: 37.2 % (ref 36.0–46.0)
Hemoglobin: 12.1 g/dL (ref 12.0–15.0)
Immature Granulocytes: 0 %
Lymphocytes Relative: 15 %
Lymphs Abs: 0.8 10*3/uL (ref 0.7–4.0)
MCH: 30 pg (ref 26.0–34.0)
MCHC: 32.5 g/dL (ref 30.0–36.0)
MCV: 92.1 fL (ref 80.0–100.0)
Monocytes Absolute: 0.5 10*3/uL (ref 0.1–1.0)
Monocytes Relative: 10 %
Neutro Abs: 3.6 10*3/uL (ref 1.7–7.7)
Neutrophils Relative %: 75 %
Platelets: 135 10*3/uL — ABNORMAL LOW (ref 150–400)
RBC: 4.04 MIL/uL (ref 3.87–5.11)
RDW: 13.9 % (ref 11.5–15.5)
WBC: 4.9 10*3/uL (ref 4.0–10.5)
nRBC: 0 % (ref 0.0–0.2)

## 2019-12-06 LAB — C-REACTIVE PROTEIN: CRP: 10.4 mg/dL — ABNORMAL HIGH (ref <1.0)

## 2019-12-06 LAB — FERRITIN: Ferritin: 230 ng/mL (ref 11–307)

## 2019-12-06 LAB — FIBRIN DERIVATIVES D-DIMER (ARMC ONLY): Fibrin derivatives D-dimer (ARMC): 728.71 ng/mL (FEU) — ABNORMAL HIGH (ref 0.00–499.00)

## 2019-12-06 LAB — ECHOCARDIOGRAM COMPLETE
Height: 64 in
Weight: 3840 oz

## 2019-12-06 LAB — MAGNESIUM: Magnesium: 2.1 mg/dL (ref 1.7–2.4)

## 2019-12-06 MED ORDER — ENSURE ENLIVE PO LIQD
237.0000 mL | Freq: Two times a day (BID) | ORAL | Status: DC
  Administered 2019-12-07 – 2019-12-10 (×8): 237 mL via ORAL

## 2019-12-06 MED ORDER — DEXAMETHASONE SODIUM PHOSPHATE 10 MG/ML IJ SOLN
6.0000 mg | Freq: Every day | INTRAMUSCULAR | Status: DC
  Administered 2019-12-06 – 2019-12-10 (×5): 6 mg via INTRAVENOUS
  Filled 2019-12-06 (×5): qty 1

## 2019-12-06 MED ORDER — LEVOTHYROXINE SODIUM 88 MCG PO TABS
88.0000 ug | ORAL_TABLET | Freq: Every day | ORAL | Status: DC
  Administered 2019-12-06 – 2019-12-10 (×5): 88 ug via ORAL
  Filled 2019-12-06 (×6): qty 1

## 2019-12-06 MED ORDER — ONDANSETRON HCL 4 MG/2ML IJ SOLN
4.0000 mg | Freq: Four times a day (QID) | INTRAMUSCULAR | Status: DC | PRN
  Administered 2019-12-06: 12:00:00 4 mg via INTRAVENOUS
  Filled 2019-12-06: qty 2

## 2019-12-06 NOTE — Progress Notes (Signed)
PROGRESS NOTE    Alexandria Moore  ZOX:[id number] DOB: 05-30-1961 DOA: 12/05/2019 PCP: Laneta Simmers, NP      Brief Narrative:  Mrs. Dupuis is a 59 y.o. F with obesity BMI 41, dCHF, HTN, OSA not on CPAP and hypothyroidism who presented with fever and cough and SOB for 2 days.  In the ER, fever to 101F, CXR showed pneumonia.  COVID Ag positive.  Started on remdesivir and steroids and admitted.          Assessment & Plan:  Coronavirus pneumonitis with acute hypoxic respiratory failure Presented with tachypnea, radiographic pneumonia and SpO2 90% in the setting of the ongoing 2020-2021 COVID-19 pandemic.  CTA showed no PE.  CRP 10, troponin minimally detectable. -Continue remdesivir and steroids  -VTE PPx with Lovenox -Continue Zinc and Vitamin C -Flutter valve, turn, cough, incentive spirometry q2hrs while awake   Elevated troponin No chest pain, doubt ACS.  -Follow echocardiogram  Hypertension BP normal -Continue amlodipine, losartan -Hold clonidine, carvedilol, patient does not take (verified after admission)  Hypothyroidism -Resume levothyroxine  OSA Does not use CPAP  Morbid obesity BMI 41 with hx HTN, OSA  Other medications -Continue gabapentin          Disposition: The patient was admitted with COVID with acute hypoxic respriatory failure from [place].   I will discharge when she is weaned to normal air and fijnished remdesivir.        DVT prophylaxis: Lovenox Code Status: FULL Family Communication: daughter by phone MDM: The below labs and imaging reports were reviewed and summarized above.  Medication management as above.    Procedures:   2/23 CTA chest -- pneumonia, no PE  Antimicrobials:   Ceftriaxone x1  Azithromycin x1   Culture data:        Subjective: Still very weak and tired.  Nausea.  Dyspnea.  Some cough, no hemoptysis no chest pain.  No vomiting, confusion.  Objective: Vitals:    12/06/19 0520 12/06/19 0800 12/06/19 1600 12/06/19 1650  BP:  138/68 (!) 90/56 (!) 111/58  Pulse:  63 (!) 57 61  Resp:  (!) 23 20   Temp: 97.6 F (36.4 C) 98.4 F (36.9 C) 98.6 F (37 C)   TempSrc: Axillary Axillary    SpO2:  91% 93%   Weight:      Height:        Intake/Output Summary (Last 24 hours) at 12/06/2019 1656 Last data filed at 12/06/2019 1459 Gross per 24 hour  Intake 1151.38 ml  Output 2475 ml  Net -1323.62 ml   Filed Weights   12/05/19 0823  Weight: 108.9 kg    Examination: General appearance:  adult female, alert and in mild distress from dyspnea.   HEENT: Anicteric, conjunctiva pink, lids and lashes normal. No nasal deformity, discharge, epistaxis.  Lips moist.   Skin: Warm and dry.  no jaundice.  No suspicious rashes or lesions. Cardiac: RRR, nl S1-S2, no murmurs appreciated.  Capillary refill is brisk.  JVP nnot visible.  No LE edema.  Radial  pulses 2+ and symmetric. Respiratory: Normal respiratory rate and rhythm, tachypneic with exertion.  CTAB without rales or wheezes. Abdomen: Abdomen soft.  no TTP or guarding. No ascites, distension, hepatosplenomegaly.   MSK: No deformities or effusions. Neuro: Awake and alert.  EOMI, moves all extremities. Speech fluent.    Psych: Sensorium intact and responding to questions, attention normal. Affect normal.  Judgment and insight appear normal.  Data Reviewed: I have personally reviewed following labs and imaging studies:  CBC: Recent Labs  Lab 12/05/19 0847 12/06/19 0431  WBC 6.1 4.9  NEUTROABS 5.2 3.6  HGB 13.1 12.1  HCT 39.7 37.2  MCV 91.3 92.1  PLT 145* 135*   Basic Metabolic Panel: Recent Labs  Lab 12/05/19 0847 12/06/19 0431  NA 137 138  K 3.7 3.9  CL 104 108  CO2 22 23  GLUCOSE 106* 116*  BUN 11 14  CREATININE 0.75 0.71  CALCIUM 8.5* 8.4*  MG  --  2.1   GFR: Estimated Creatinine Clearance: 90.2 mL/min (by C-G formula based on SCr of 0.71 mg/dL). Liver Function  Tests: Recent Labs  Lab 12/06/19 0431  AST 22  ALT 16  ALKPHOS 43  BILITOT 0.5  PROT 6.8  ALBUMIN 3.0*   No results for input(s): LIPASE, AMYLASE in the last 168 hours. No results for input(s): AMMONIA in the last 168 hours. Coagulation Profile: No results for input(s): INR, PROTIME in the last 168 hours. Cardiac Enzymes: No results for input(s): CKTOTAL, CKMB, CKMBINDEX, TROPONINI in the last 168 hours. BNP (last 3 results) No results for input(s): PROBNP in the last 8760 hours. HbA1C: No results for input(s): HGBA1C in the last 72 hours. CBG: No results for input(s): GLUCAP in the last 168 hours. Lipid Profile: No results for input(s): CHOL, HDL, LDLCALC, TRIG, CHOLHDL, LDLDIRECT in the last 72 hours. Thyroid Function Tests: No results for input(s): TSH, T4TOTAL, FREET4, T3FREE, THYROIDAB in the last 72 hours. Anemia Panel: Recent Labs    12/05/19 1038 12/06/19 0431  FERRITIN 206 230   Urine analysis:    Component Value Date/Time   COLORURINE YELLOW (A) 05/04/2018 0834   APPEARANCEUR HAZY (A) 05/04/2018 0834   LABSPEC 1.025 05/04/2018 0834   PHURINE 5.0 05/04/2018 0834   GLUCOSEU NEGATIVE 05/04/2018 0834   HGBUR NEGATIVE 05/04/2018 0834   BILIRUBINUR NEGATIVE 05/04/2018 0834   KETONESUR NEGATIVE 05/04/2018 0834   PROTEINUR NEGATIVE 05/04/2018 0834   NITRITE NEGATIVE 05/04/2018 0834   LEUKOCYTESUR NEGATIVE 05/04/2018 0834   Sepsis Labs: @LABRCNTIP (procalcitonin:4,lacticacidven:4)  )No results found for this or any previous visit (from the past 240 hour(s)).       Radiology Studies: CT ANGIO CHEST PE W OR WO CONTRAST  Result Date: 12/05/2019 CLINICAL DATA:  Respiratory failure. Acute onset of shortness of breath. COVID-19 positive on 12/05/2019. EXAM: CT ANGIOGRAPHY CHEST WITH CONTRAST TECHNIQUE: Multidetector CT imaging of the chest was performed using the standard protocol during bolus administration of intravenous contrast. Multiplanar CT image  reconstructions and MIPs were obtained to evaluate the vascular anatomy. CONTRAST:  11mL OMNIPAQUE IOHEXOL 350 MG/ML SOLN COMPARISON:  CT scan dated 10/25/2004 FINDINGS: Cardiovascular: No pulmonary emboli. Cardiomegaly. Dilated left ventricle. Minimal pericardial effusion. Mediastinum/Nodes: Multinodular goiter. No significant hilar or mediastinal adenopathy. Esophagus is normal. Lungs/Pleura: Numerous bilateral patchy pulmonary infiltrates, most extensive in the right perihilar region but involving all lobes. No effusions. Upper Abdomen: Hepatomegaly. No acute abnormality. Musculoskeletal: No chest wall abnormality. No acute or significant osseous findings. Review of the MIP images confirms the above findings. IMPRESSION: 1. No pulmonary emboli. 2. Numerous bilateral patchy pulmonary infiltrates consistent with pneumonia. 3. Cardiomegaly with dilated left ventricle. 4. Hepatomegaly. 5. Multinodular goiter. Electronically Signed   By: 10/27/2004 M.D.   On: 12/05/2019 20:01   DG Chest Portable 1 View  Result Date: 12/05/2019 CLINICAL DATA:  Shortness of breath and chest discomfort EXAM: PORTABLE CHEST 1 VIEW COMPARISON:  December 02, 2014 FINDINGS: There is ill-defined airspace opacity in the right upper lobe as well as in both lower lung regions. There is slight consolidation in the left base. Heart is mildly enlarged with pulmonary vascularity normal. No adenopathy. There is degenerative change in the thoracic spine. No pneumothorax. IMPRESSION: Multifocal pneumonia.  Suspect atypical organism pneumonia. Stable cardiac enlargement.  No evident adenopathy. Electronically Signed   By: Bretta Bang III M.D.   On: 12/05/2019 09:17        Scheduled Meds: . acetaminophen  1,000 mg Oral Q6H  . amLODipine  10 mg Oral Daily  . vitamin C  500 mg Oral Daily  . dexamethasone (DECADRON) injection  6 mg Intravenous Daily  . enoxaparin (LOVENOX) injection  40 mg Subcutaneous Q12H  . [START ON 12/07/2019]  feeding supplement (ENSURE ENLIVE)  237 mL Oral BID BM  . gabapentin  300 mg Oral TID  . levothyroxine  88 mcg Oral Q0600  . losartan  100 mg Oral Daily  . sodium chloride flush  3 mL Intravenous Q12H  . zinc sulfate  220 mg Oral Daily   Continuous Infusions: . sodium chloride    . remdesivir 100 mg in NS 100 mL Stopped (12/06/19 0940)     LOS: 1 day    Time spent: 25 minutes      Alberteen Sam, MD [facility] 12/06/2019, 4:56 PM     Please page through AMION:  [email or web address] Contact charge nurse for password If 7PM-7AM, please contact night-coverage

## 2019-12-06 NOTE — Progress Notes (Signed)
*  PRELIMINARY RESULTS* Echocardiogram 2D Echocardiogram has been performed.  Cristela Blue 12/06/2019, 11:06 AM

## 2019-12-06 NOTE — Progress Notes (Signed)
Initial Nutrition Assessment  RD working remotely.  DOCUMENTATION CODES:   Not applicable  INTERVENTION:  Recommend liberalizing diet to 2 gram sodium.  Provide Ensure Enlive po BID, each supplement provides 350 kcal and 20 grams of protein.  Recommend obtaining bed scale weight.  NUTRITION DIAGNOSIS:   Increased nutrient needs related to catabolic illness(COVID-19) as evidenced by estimated needs.  GOAL:   Patient will meet greater than or equal to 90% of their needs  MONITOR:   PO intake, Supplement acceptance, Labs, Weight trends, I & O's  REASON FOR ASSESSMENT:   Consult Assessment of nutrition requirement/status  ASSESSMENT:   59 year old female with PMHx of HTN, GERD, CHF, sleep apnea, hypothyroidism, arthritis admitted with acute hypoxic respiratory failure due to COVID-19 PNA.   Attempted to call patient over the phone but she was unable to answer. According to chart she ate 60% of breakfast this morning and 90% of lunch today. She has increased calorie/protein needs related to catabolic nature of COVID-19. She would benefit from a liberalized diet and oral nutrition supplements.  Unable to trend weight as every weight entered in chart since 04/01/2017 has been exactly 108.9 kg (240 lbs) so these are just stated/estimated weights.  Medications reviewed and include: vitamin C 500 mg daily, Decadron 6 mg daily IV, gabapentin, levothyroxine, zinc sulfate 220 mg daily, remdesivir.  Labs reviewed.  NUTRITION - FOCUSED PHYSICAL EXAM:  Unable to complete at this time.  Diet Order:   Diet Order            Diet Heart Room service appropriate? Yes; Fluid consistency: Thin  Diet effective now             EDUCATION NEEDS:   No education needs have been identified at this time  Skin:  Skin Assessment: Reviewed RN Assessment  Last BM:  12/04/2019  Height:   Ht Readings from Last 1 Encounters:  12/05/19 5\' 4"  (1.626 m)   Weight:   Wt Readings from Last 1  Encounters:  12/05/19 108.9 kg   BMI:  Body mass index is 41.2 kg/m.  Estimated Nutritional Needs:   Kcal:  1900-2200  Protein:  110-120 grams  Fluid:  1.6 L/day  12/07/19, MS, RD, LDN Pager number available on Amion

## 2019-12-06 NOTE — Progress Notes (Signed)
Pt data for yellow mews not verified. Upon finding this RN went bedside to assess. Repositioned BP cuff for improved accuracy. Bp of 112/58 was obtained with repositioned BP cuff. Manually counted RR rate of 20 breaths per min. Upon assessment pt found to Mews score of 0.        MEWS Guidelines - (patients age 61 and over)  Red - At High Risk for Deterioration Yellow - At risk for Deterioration  1. Go to room and assess patient 2. Validate data. Is this patient's baseline? If data confirmed: 3. Is this an acute change? 4. Administer prn meds/treatments as ordered. 5. Note Sepsis score 6. Review goals of care 7. Radiation protection practitioner, RRT nurse and Provider. 8. Ask Provider to come to bedside.  9. Document patient condition/interventions/response. 10. Increase frequency of vital signs and focused assessments to at least q15 minutes x 4, then q30 minutes x2. - If stable, then q1h x3, then q4h x3 and then q8h or dept. routine. - If unstable, contact Provider & RRT nurse. Prepare for possible transfer. 11. Add entry in progress notes using the smart phrase ".MEWS". 1. Go to room and assess patient 2. Validate data. Is this patient's baseline? If data confirmed: 3. Is this an acute change? 4. Administer prn meds/treatments as ordered? 5. Note Sepsis score 6. Review goals of care 7. Radiation protection practitioner and Provider 8. Call RRT nurse as needed. 9. Document patient condition/interventions/response. 10. Increase frequency of vital signs and focused assessments to at least q2h x2. - If stable, then q4h x2 and then q8h or dept. routine. - If unstable, contact Provider & RRT nurse. Prepare for possible transfer. 11. Add entry in progress notes using the smart phrase ".MEWS".  Green - Likely stable Lavender - Comfort Care Only  1. Continue routine/ordered monitoring.  2. Review goals of care. 1. Continue routine/ordered monitoring. 2. Review goals of care.

## 2019-12-07 LAB — COMPREHENSIVE METABOLIC PANEL
ALT: 18 U/L (ref 0–44)
AST: 22 U/L (ref 15–41)
Albumin: 2.9 g/dL — ABNORMAL LOW (ref 3.5–5.0)
Alkaline Phosphatase: 43 U/L (ref 38–126)
Anion gap: 8 (ref 5–15)
BUN: 12 mg/dL (ref 6–20)
CO2: 23 mmol/L (ref 22–32)
Calcium: 8.6 mg/dL — ABNORMAL LOW (ref 8.9–10.3)
Chloride: 108 mmol/L (ref 98–111)
Creatinine, Ser: 0.56 mg/dL (ref 0.44–1.00)
GFR calc Af Amer: >60 mL/min (ref >60)
GFR calc non Af Amer: >60 mL/min (ref >60)
Glucose, Bld: 153 mg/dL — ABNORMAL HIGH (ref 70–99)
Potassium: 3.9 mmol/L (ref 3.5–5.1)
Sodium: 139 mmol/L (ref 135–145)
Total Bilirubin: 0.4 mg/dL (ref 0.3–1.2)
Total Protein: 6.7 g/dL (ref 6.5–8.1)

## 2019-12-07 LAB — FIBRIN DERIVATIVES D-DIMER (ARMC ONLY): Fibrin derivatives D-dimer (ARMC): 518.85 ng/mL (FEU) — ABNORMAL HIGH (ref 0.00–499.00)

## 2019-12-07 LAB — C-REACTIVE PROTEIN: CRP: 4.4 mg/dL — ABNORMAL HIGH (ref <1.0)

## 2019-12-07 LAB — CBC WITH DIFFERENTIAL/PLATELET
Abs Immature Granulocytes: 0.04 10*3/uL (ref 0.00–0.07)
Basophils Absolute: 0 10*3/uL (ref 0.0–0.1)
Basophils Relative: 0 %
Eosinophils Absolute: 0 10*3/uL (ref 0.0–0.5)
Eosinophils Relative: 0 %
HCT: 38.3 % (ref 36.0–46.0)
Hemoglobin: 12.7 g/dL (ref 12.0–15.0)
Immature Granulocytes: 1 %
Lymphocytes Relative: 11 %
Lymphs Abs: 0.9 10*3/uL (ref 0.7–4.0)
MCH: 30 pg (ref 26.0–34.0)
MCHC: 33.2 g/dL (ref 30.0–36.0)
MCV: 90.5 fL (ref 80.0–100.0)
Monocytes Absolute: 0.5 10*3/uL (ref 0.1–1.0)
Monocytes Relative: 6 %
Neutro Abs: 6.5 10*3/uL (ref 1.7–7.7)
Neutrophils Relative %: 82 %
Platelets: 184 10*3/uL (ref 150–400)
RBC: 4.23 MIL/uL (ref 3.87–5.11)
RDW: 13.9 % (ref 11.5–15.5)
WBC: 7.9 10*3/uL (ref 4.0–10.5)
nRBC: 0 % (ref 0.0–0.2)

## 2019-12-07 LAB — FERRITIN: Ferritin: 277 ng/mL (ref 11–307)

## 2019-12-07 LAB — MAGNESIUM: Magnesium: 2 mg/dL (ref 1.7–2.4)

## 2019-12-07 NOTE — Progress Notes (Addendum)
PROGRESS NOTE    Alexandria Moore  HBZ:[id number] DOB: 01-29-1961 DOA: 12/05/2019 PCP: Evie Lacks, NP      Brief Narrative:  Alexandria Moore is a 59 y.o. F with obesity BMI 41, dCHF, HTN, OSA not on CPAP and hypothyroidism who presented with fever and cough and SOB for 2 days.  In the ER, fever to 101F, CXR showed pneumonia.  COVID Ag positive.  Started on remdesivir and steroids and admitted.          Assessment & Plan:  Coronavirus pneumonitis with acute hypoxic respiratory failure Presented with tachypnea, radiographic pneumonia and SpO2 90% in the setting of the ongoing 2020-2021 COVID-19 pandemic.  CTA showed no PE.  CRP 10, troponin minimally detectable.  SPO2 stable on 2-3L. -Continue remdesivir day 3 of 5 -Continue steroids day 3  -Continue VTE PPx with Lovenox -Continue Zinc and Vitamin C -Flutter valve, turn, cough, incentive spirometry q2hrs while awake   Elevated troponin No chest pain, doubt ACS.  -Follow echocardiogram, result still pending  Hypertension Chronic diastolic CHF Appears euvolemic,  BP controlled -Continue amlodipine, losartan  Hypothyroidism -Continue levothyroxine  OSA Does not use CPAP  Morbid obesity BMI 41 with hx HTN, OSA  Other medications -Continue gabapentin          Disposition: The patient was admitted with COVID with acute hypoxic respriatory failure from COVID19.    I will discharge when she is weaned to normal air and finished remdesivir.        DVT prophylaxis: Lovenox Code Status: FULL Family Communication: daughter by phone MDM: The below labs and imaging reports reviewed and summarized above.  Medication management as above.     Procedures:   2/23 CTA chest -- pneumonia, no PE  Antimicrobials:   Ceftriaxone x1  Azithromycin x1   Culture data:        Subjective: The week, some hip pain, still nauseated and dyspneic on exertion.  Still with a  cough.  Objective: Vitals:   12/06/19 2343 12/06/19 2349 12/07/19 0000 12/07/19 0331  BP: (!) 95/45 (!) 112/58 (!) 116/59   Pulse: 66 65 (!) 59   Resp: (!) 31 20 18    Temp:      TempSrc:      SpO2: 93%  92%   Weight:    106.1 kg  Height:        Intake/Output Summary (Last 24 hours) at 12/07/2019 1619 Last data filed at 12/07/2019 0451 Gross per 24 hour  Intake 360 ml  Output 1700 ml  Net -1340 ml   Filed Weights   12/05/19 0823 12/07/19 0331  Weight: 108.9 kg 106.1 kg    Examination: General appearance: Obese adult female, alert and in no acute distress.   HEENT: Anicteric, conjunctiva pink, lids and lashes normal. No nasal deformity, discharge, epistaxis.  Lips moist, teeth normal. OP normal no oral lesions.   Skin: Warm and dry.  No suspicious rashes or lesions. Cardiac: RRR, no murmurs appreciated.  No LE edema.    Respiratory: Normal respiratory rate and rhythm.  CTAB without rales or wheezes. Abdomen: Abdomen soft.  No tenderness palpation or guarding. No ascites, distension, hepatosplenomegaly.   MSK: No deformities or effusions of the large joints of the upper or lower extremities bilaterally. Neuro: Awake and alert. Naming is grossly intact, and the patient's recall, recent and remote, as well as general fund of knowledge seem within normal limits.  Muscle tone normal, without fasciculations.  Moves all extremities equally  and with normal coordination.  Speech fluent.    Psych: Sensorium intact and responding to questions, attention normal. Affect normal.  Judgment and insight appear normal.            Data Reviewed: I have personally reviewed following labs and imaging studies:  CBC: Recent Labs  Lab 12/05/19 0847 12/06/19 0431 12/07/19 0432  WBC 6.1 4.9 7.9  NEUTROABS 5.2 3.6 6.5  HGB 13.1 12.1 12.7  HCT 39.7 37.2 38.3  MCV 91.3 92.1 90.5  PLT 145* 135* 184   Basic Metabolic Panel: Recent Labs  Lab 12/05/19 0847 12/06/19 0431 12/07/19 0432   NA 137 138 139  K 3.7 3.9 3.9  CL 104 108 108  CO2 22 23 23   GLUCOSE 106* 116* 153*  BUN 11 14 12   CREATININE 0.75 0.71 0.56  CALCIUM 8.5* 8.4* 8.6*  MG  --  2.1 2.0   GFR: Estimated Creatinine Clearance: 88.9 mL/min (by C-G formula based on SCr of 0.56 mg/dL). Liver Function Tests: Recent Labs  Lab 12/06/19 0431 12/07/19 0432  AST 22 22  ALT 16 18  ALKPHOS 43 43  BILITOT 0.5 0.4  PROT 6.8 6.7  ALBUMIN 3.0* 2.9*   No results for input(s): LIPASE, AMYLASE in the last 168 hours. No results for input(s): AMMONIA in the last 168 hours. Coagulation Profile: No results for input(s): INR, PROTIME in the last 168 hours. Cardiac Enzymes: No results for input(s): CKTOTAL, CKMB, CKMBINDEX, TROPONINI in the last 168 hours. BNP (last 3 results) No results for input(s): PROBNP in the last 8760 hours. HbA1C: No results for input(s): HGBA1C in the last 72 hours. CBG: No results for input(s): GLUCAP in the last 168 hours. Lipid Profile: No results for input(s): CHOL, HDL, LDLCALC, TRIG, CHOLHDL, LDLDIRECT in the last 72 hours. Thyroid Function Tests: No results for input(s): TSH, T4TOTAL, FREET4, T3FREE, THYROIDAB in the last 72 hours. Anemia Panel: Recent Labs    12/06/19 0431 12/07/19 0432  FERRITIN 230 277   Urine analysis:    Component Value Date/Time   COLORURINE YELLOW (A) 05/04/2018 0834   APPEARANCEUR HAZY (A) 05/04/2018 0834   LABSPEC 1.025 05/04/2018 0834   PHURINE 5.0 05/04/2018 0834   GLUCOSEU NEGATIVE 05/04/2018 0834   HGBUR NEGATIVE 05/04/2018 0834   BILIRUBINUR NEGATIVE 05/04/2018 0834   KETONESUR NEGATIVE 05/04/2018 0834   PROTEINUR NEGATIVE 05/04/2018 0834   NITRITE NEGATIVE 05/04/2018 0834   LEUKOCYTESUR NEGATIVE 05/04/2018 0834   Sepsis Labs: @LABRCNTIP (procalcitonin:4,lacticacidven:4)  )No results found for this or any previous visit (from the past 240 hour(s)).       Radiology Studies: CT ANGIO CHEST PE W OR WO CONTRAST  Result Date:  12/05/2019 CLINICAL DATA:  Respiratory failure. Acute onset of shortness of breath. COVID-19 positive on 12/05/2019. EXAM: CT ANGIOGRAPHY CHEST WITH CONTRAST TECHNIQUE: Multidetector CT imaging of the chest was performed using the standard protocol during bolus administration of intravenous contrast. Multiplanar CT image reconstructions and MIPs were obtained to evaluate the vascular anatomy. CONTRAST:  58mL OMNIPAQUE IOHEXOL 350 MG/ML SOLN COMPARISON:  CT scan dated 10/25/2004 FINDINGS: Cardiovascular: No pulmonary emboli. Cardiomegaly. Dilated left ventricle. Minimal pericardial effusion. Mediastinum/Nodes: Multinodular goiter. No significant hilar or mediastinal adenopathy. Esophagus is normal. Lungs/Pleura: Numerous bilateral patchy pulmonary infiltrates, most extensive in the right perihilar region but involving all lobes. No effusions. Upper Abdomen: Hepatomegaly. No acute abnormality. Musculoskeletal: No chest wall abnormality. No acute or significant osseous findings. Review of the MIP images confirms the above findings. IMPRESSION: 1. No  pulmonary emboli. 2. Numerous bilateral patchy pulmonary infiltrates consistent with pneumonia. 3. Cardiomegaly with dilated left ventricle. 4. Hepatomegaly. 5. Multinodular goiter. Electronically Signed   By: Lorriane Shire M.D.   On: 12/05/2019 20:01        Scheduled Meds: . acetaminophen  1,000 mg Oral Q6H  . amLODipine  10 mg Oral Daily  . vitamin C  500 mg Oral Daily  . dexamethasone (DECADRON) injection  6 mg Intravenous Daily  . enoxaparin (LOVENOX) injection  40 mg Subcutaneous Q12H  . feeding supplement (ENSURE ENLIVE)  237 mL Oral BID BM  . gabapentin  300 mg Oral TID  . levothyroxine  88 mcg Oral Q0600  . losartan  100 mg Oral Daily  . sodium chloride flush  3 mL Intravenous Q12H  . zinc sulfate  220 mg Oral Daily   Continuous Infusions: . sodium chloride    . remdesivir 100 mg in NS 100 mL Stopped (12/07/19 1100)     LOS: 2 days     Time spent: 25 minutes      Edwin Dada, MD [facility] 12/07/2019, 4:19 PM     Please page through [place]:  [email or web address] Contact charge nurse for password If 7PM-7AM, please contact night-coverage

## 2019-12-08 LAB — COMPREHENSIVE METABOLIC PANEL
ALT: 22 U/L (ref 0–44)
AST: 24 U/L (ref 15–41)
Albumin: 2.9 g/dL — ABNORMAL LOW (ref 3.5–5.0)
Alkaline Phosphatase: 43 U/L (ref 38–126)
Anion gap: 8 (ref 5–15)
BUN: 13 mg/dL (ref 6–20)
CO2: 23 mmol/L (ref 22–32)
Calcium: 8.5 mg/dL — ABNORMAL LOW (ref 8.9–10.3)
Chloride: 107 mmol/L (ref 98–111)
Creatinine, Ser: 0.6 mg/dL (ref 0.44–1.00)
GFR calc Af Amer: >60 mL/min (ref >60)
GFR calc non Af Amer: >60 mL/min (ref >60)
Glucose, Bld: 160 mg/dL — ABNORMAL HIGH (ref 70–99)
Potassium: 3.6 mmol/L (ref 3.5–5.1)
Sodium: 138 mmol/L (ref 135–145)
Total Bilirubin: 0.4 mg/dL (ref 0.3–1.2)
Total Protein: 6.5 g/dL (ref 6.5–8.1)

## 2019-12-08 LAB — CBC WITH DIFFERENTIAL/PLATELET
Abs Immature Granulocytes: 0.04 10*3/uL (ref 0.00–0.07)
Basophils Absolute: 0 10*3/uL (ref 0.0–0.1)
Basophils Relative: 0 %
Eosinophils Absolute: 0 10*3/uL (ref 0.0–0.5)
Eosinophils Relative: 0 %
HCT: 38.3 % (ref 36.0–46.0)
Hemoglobin: 12.6 g/dL (ref 12.0–15.0)
Immature Granulocytes: 1 %
Lymphocytes Relative: 15 %
Lymphs Abs: 1.1 10*3/uL (ref 0.7–4.0)
MCH: 29.9 pg (ref 26.0–34.0)
MCHC: 32.9 g/dL (ref 30.0–36.0)
MCV: 90.8 fL (ref 80.0–100.0)
Monocytes Absolute: 0.4 10*3/uL (ref 0.1–1.0)
Monocytes Relative: 6 %
Neutro Abs: 5.6 10*3/uL (ref 1.7–7.7)
Neutrophils Relative %: 78 %
Platelets: 216 10*3/uL (ref 150–400)
RBC: 4.22 MIL/uL (ref 3.87–5.11)
RDW: 13.6 % (ref 11.5–15.5)
WBC: 7.1 10*3/uL (ref 4.0–10.5)
nRBC: 0 % (ref 0.0–0.2)

## 2019-12-08 LAB — MAGNESIUM: Magnesium: 1.9 mg/dL (ref 1.7–2.4)

## 2019-12-08 LAB — C-REACTIVE PROTEIN: CRP: 3.1 mg/dL — ABNORMAL HIGH (ref <1.0)

## 2019-12-08 LAB — FIBRIN DERIVATIVES D-DIMER (ARMC ONLY): Fibrin derivatives D-dimer (ARMC): 445.74 ng/mL (FEU) (ref 0.00–499.00)

## 2019-12-08 LAB — FERRITIN: Ferritin: 299 ng/mL (ref 11–307)

## 2019-12-08 NOTE — Progress Notes (Signed)
PROGRESS NOTE    Alexandria Moore  NKN:[id number] DOB: 05-03-1961 DOA: 12/05/2019 PCP: Laneta Simmers, NP      Brief Narrative:  Alexandria Moore is a 59 y.o. F with obesity BMI 41, dCHF, HTN, OSA not on CPAP and hypothyroidism who presented with fever and cough and SOB for 2 days.  In the ER, fever to 101F, CXR showed pneumonia.  COVID Ag positive.  Started on remdesivir and steroids and admitted.          Assessment & Plan:  Coronavirus pneumonitis with acute hypoxic respiratory failure Presented with tachypnea, radiographic pneumonia and SpO2 90% in the setting of the ongoing 2020-2021 COVID-19 pandemic.  CTA showed no PE.  CRP 10, troponin minimally detectable.  O2 requirements improving, inflammatory markers stable.  -Continue remdesivir day 4 of 5 -Continue steroids day 3  -Continue VTE PPx with Lovenox -Continue Zinc and Vitamin C -Flutter valve, turn, cough, incentive spirometry q2hrs while awake   Elevated troponin No chest pain, doubt ACS. Echo showed no RWMA. No further ischemic work up necessary.  Hypertension Chronic diastolic CHF Appears euvolemic, BP controlled -Continue amlodipine, losartan  Hypothyroidism -Continue levothyroxine  OSA Does not use CPAP  Morbid obesity BMI 41 with hx HTN, OSA  Other medications -Continue gabapentin          Disposition: The patient was admitted with COVID with acute hypoxic respriatory failure from [place].    I will discharge when she is weaned to normal air and finished remdesivir.  At this time, continued inpatient services are reasonable and expected, given the ongoing supplemental O2, emerging pathogen infection and the high likelihood of an adverse outcome, including readmission, debility or death if the patient were to be discharged prematurely.        DVT prophylaxis: Lovenox Code Status: FULL Family Communication: daughter by phone MDM:  The below labs and imaging reports  reviewed and summarized above.  Medication management as above.     Procedures:   2/23 CTA chest -- pneumonia, no PE  Antimicrobials:   Ceftriaxone x1  Azithromycin x1   Culture data:        Subjective: [name], chronic but worse than previous days.  No fever, confusion.  No sputum, hemoptysis, chest pain.  No vomiting.  Still cough.     Objective: Vitals:   12/08/19 0105 12/08/19 0500 12/08/19 0731 12/08/19 1600  BP: 126/62  140/69 114/64  Pulse: 72  (!) 56 69  Resp: 18  18 18   Temp: 98.1 F (36.7 C)  98.2 F (36.8 C) 97.9 F (36.6 C)  TempSrc: Oral  Oral Oral  SpO2: 96%  95% 100%  Weight:  111.4 kg    Height:        Intake/Output Summary (Last 24 hours) at 12/08/2019 1634 Last data filed at 12/08/2019 1046 Gross per 24 hour  Intake --  Output 3500 ml  Net -3500 ml   Filed Weights   12/05/19 0823 12/07/19 0331 12/08/19 0500  Weight: 108.9 kg 106.1 kg 111.4 kg    Examination: General appearance: Obese adult female, alert and in no acute distress.   HEENT: Anicteric, conjunctiva pink, lids and lashes normal. No nasal deformity, discharge, epistaxis.  Lips moist, teeth normal. OP normal, no oral lesions.   Skin: Warm and dry.  No suspicious rashes or lesions. Cardiac: RRR, no murmurs appreciated.  No LE edema.    Respiratory: Normal respiratory rate and rhythm.  CTAB without rales  or wheezes. Abdomen: Abdomen soft.  No tenderness palpation or guarding. No ascites, distension, hepatosplenomegaly.   MSK: No deformities or effusions of the large joints of the upper or lower extremities bilaterally. Neuro: Awake and alert. Naming is grossly intact, and the patient's recall, recent and remote, as well as general fund of knowledge seem within normal limits.  Muscle tone normal, without fasciculations.  Moves all extremities equally and with normal coordination.  Speech fluent.    Psych: Sensorium intact and responding to questions,  attention normal. Affect normal.  Judgment and insight appear normal.              Data Reviewed: I have personally reviewed following labs and imaging studies:  CBC: Recent Labs  Lab 12/05/19 0847 12/06/19 0431 12/07/19 0432 12/08/19 0339  WBC 6.1 4.9 7.9 7.1  NEUTROABS 5.2 3.6 6.5 5.6  HGB 13.1 12.1 12.7 12.6  HCT 39.7 37.2 38.3 38.3  MCV 91.3 92.1 90.5 90.8  PLT 145* 135* 184 216   Basic Metabolic Panel: Recent Labs  Lab 12/05/19 0847 12/06/19 0431 12/07/19 0432 12/08/19 0339  NA 137 138 139 138  K 3.7 3.9 3.9 3.6  CL 104 108 108 107  CO2 22 23 23 23   GLUCOSE 106* 116* 153* 160*  BUN 11 14 12 13   CREATININE 0.75 0.71 0.56 0.60  CALCIUM 8.5* 8.4* 8.6* 8.5*  MG  --  2.1 2.0 1.9   GFR: Estimated Creatinine Clearance: 91.4 mL/min (by C-G formula based on SCr of 0.6 mg/dL). Liver Function Tests: Recent Labs  Lab 12/06/19 0431 12/07/19 0432 12/08/19 0339  AST 22 22 24   ALT 16 18 22   ALKPHOS 43 43 43  BILITOT 0.5 0.4 0.4  PROT 6.8 6.7 6.5  ALBUMIN 3.0* 2.9* 2.9*   No results for input(s): LIPASE, AMYLASE in the last 168 hours. No results for input(s): AMMONIA in the last 168 hours. Coagulation Profile: No results for input(s): INR, PROTIME in the last 168 hours. Cardiac Enzymes: No results for input(s): CKTOTAL, CKMB, CKMBINDEX, TROPONINI in the last 168 hours. BNP (last 3 results) No results for input(s): PROBNP in the last 8760 hours. HbA1C: No results for input(s): HGBA1C in the last 72 hours. CBG: No results for input(s): GLUCAP in the last 168 hours. Lipid Profile: No results for input(s): CHOL, HDL, LDLCALC, TRIG, CHOLHDL, LDLDIRECT in the last 72 hours. Thyroid Function Tests: No results for input(s): TSH, T4TOTAL, FREET4, T3FREE, THYROIDAB in the last 72 hours. Anemia Panel: Recent Labs    12/07/19 0432 12/08/19 0339  FERRITIN 277 299   Urine analysis:    Component Value Date/Time   COLORURINE YELLOW (A) 05/04/2018 0834    APPEARANCEUR HAZY (A) 05/04/2018 0834   LABSPEC 1.025 05/04/2018 0834   PHURINE 5.0 05/04/2018 0834   GLUCOSEU NEGATIVE 05/04/2018 0834   HGBUR NEGATIVE 05/04/2018 0834   BILIRUBINUR NEGATIVE 05/04/2018 0834   KETONESUR NEGATIVE 05/04/2018 0834   PROTEINUR NEGATIVE 05/04/2018 0834   NITRITE NEGATIVE 05/04/2018 0834   LEUKOCYTESUR NEGATIVE 05/04/2018 0834   Sepsis Labs: @LABRCNTIP (procalcitonin:4,lacticacidven:4)  )No results found for this or any previous visit (from the past 240 hour(s)).       Radiology Studies: No results found.      Scheduled Meds: . acetaminophen  1,000 mg Oral Q6H  . amLODipine  10 mg Oral Daily  . vitamin C  500 mg Oral Daily  . dexamethasone (DECADRON) injection  6 mg Intravenous Daily  . enoxaparin (LOVENOX) injection  40 mg  Subcutaneous Q12H  . feeding supplement (ENSURE ENLIVE)  237 mL Oral BID BM  . gabapentin  300 mg Oral TID  . levothyroxine  88 mcg Oral Q0600  . losartan  100 mg Oral Daily  . sodium chloride flush  3 mL Intravenous Q12H  . zinc sulfate  220 mg Oral Daily   Continuous Infusions: . sodium chloride    . remdesivir 100 mg in NS 100 mL Stopped (12/08/19 1030)     LOS: 3 days    Time spent: 25 minutes      Alberteen Sam, MD [facility] 12/08/2019, 4:34 PM     Please page through [facility]:  [email or web address] Contact charge nurse for password If 7PM-7AM, please contact night-coverage

## 2019-12-08 NOTE — TOC Initial Note (Signed)
Transition of Care [facility]) - Initial/Assessment Note    Patient Details  Name: Alexandria Moore MRN: [id number] Date of Birth: 1961-03-04  [facility]) CM/SW Contact:    Allayne Butcher, RN Phone Number: 12/08/2019, 12:55 PM  Clinical Narrative:                 Patient admitted with COVID 19 requiring acute O2 at 2L.  Patient is from home and lives with her fiance in [place].  Patient is independent in ADL's at home and drives.  Patient will likely need oxygen at discharge, Mitchell Heir with [employer] has been notified of oxygen need, patient may discharge tomorrow per MD.  Patient would also like home health PT, referral given to [facility], waiting to hear back if they can accept for home health.    Expected Discharge Plan: Home w Home Health Services Barriers to Discharge: Continued Medical Work up   Patient Goals and CMS Choice Patient states their goals for this hospitalization and ongoing recovery are:: She does not want to go home too soon and have to come back to the hospital CMS Medicare.gov Compare Post Acute Care list provided to:: Patient Choice offered to / list presented to : Patient  Expected Discharge Plan and Services Expected Discharge Plan: Home w Home Health Services   Discharge Planning Services: CM Consult Post Acute Care Choice: Home Health Living arrangements for the past 2 months: Mobile Home                 DME Arranged: Oxygen DME Agency: [employer] Date DME Agency Contacted: 12/08/19 Time DME Agency Contacted: 1254 Representative spoke with at DME Agency: Mitchell Heir            Prior Living Arrangements/Services Living arrangements for the past 2 months: Mobile Home Lives with:: Significant Other Patient language and need for interpreter reviewed:: Yes Do you feel safe going back to the place where you live?: Yes      Need for Family Participation in Patient Care: Yes (Comment)(COVID) Care giver support system in place?: Yes  (comment)(significant other) Current home services: DME(Rolling walker and 3 in 1) Criminal Activity/Legal Involvement Pertinent to Current Situation/Hospitalization: No - Comment as needed  Activities of Daily Living Home Assistive Devices/Equipment: None ADL Screening (condition at time of admission) Patient's cognitive ability adequate to safely complete daily activities?: Yes Is the patient deaf or have difficulty hearing?: No Does the patient have difficulty seeing, even when wearing glasses/contacts?: No Does the patient have difficulty concentrating, remembering, or making decisions?: No Patient able to express need for assistance with ADLs?: Yes Does the patient have difficulty dressing or bathing?: No Independently performs ADLs?: Yes (appropriate for developmental age) Does the patient have difficulty walking or climbing stairs?: No Weakness of Legs: None Weakness of Arms/Hands: None  Permission Sought/Granted Permission sought to share information with : Case Manager, Other (comment) Permission granted to share information with : Yes, Verbal Permission Granted     Permission granted to share info w AGENCY: Home health agency        Emotional Assessment   Attitude/Demeanor/Rapport: Engaged Affect (typically observed): Accepting Orientation: : Oriented to Self, Oriented to Place, Oriented to  Time, Oriented to Situation Alcohol / Substance Use: Not Applicable Psych Involvement: No (comment)  Admission diagnosis:  Pneumonia due to COVID-19 virus [U07.1, J12.82] Patient Active Problem List   Diagnosis Date Noted  . Essential hypertension 12/05/2019  . Pneumonia due to COVID-19 virus 12/05/2019  .  Acute respiratory failure with hypoxia ([place]) 12/05/2019  . Congestive heart failure ([place]) 12/05/2019  . Thrombocytopenia ([place]) 12/05/2019  . Elevated troponin level 12/05/2019  . Status post total hip replacement, left 05/12/2018   PCP:  Laneta Simmers, NP Pharmacy:    [facility] [address]), [place]) [place] [address] Phone: [phone] Fax: [place], [place] [address] [address] Phone: [phone] Fax: [phone]     Social Determinants of Health (SDOH) Interventions    Readmission Risk Interventions No flowsheet data found.

## 2019-12-08 NOTE — Progress Notes (Signed)
Patient sitting at rest, O2 sat 90-91% on RA. Patient ambulated around 1C nursing unit, while on RA patient desats to 84% during ambulation. Sat remains the same until placed on 3L O2, then sat 88%-90%. Once back at rest patient sat 92% on 3L [place].

## 2019-12-08 NOTE — TOC Progression Note (Signed)
Transition of Care [facility]) - Progression Note    Patient Details  Name: Alexandria Moore MRN: [id number] Date of Birth: [birth year]  Transition of Care [facility]) CM/SW Contact  Allayne Butcher, RN Phone Number: 12/08/2019, 3:29 PM  Clinical Narrative:    RNCM spoke with patient again and patient reports that she doesn't think she needs home health.  RNCM explained to patient that if she changes her mind she can follow up with her PCP and they can order home health services.     Expected Discharge Plan: Home w Home Health Services Barriers to Discharge: Continued Medical Work up  Expected Discharge Plan and Services Expected Discharge Plan: Home w Home Health Services   Discharge Planning Services: CM Consult Post Acute Care Choice: Home Health Living arrangements for the past 2 months: Mobile Home                 DME Arranged: Oxygen DME Agency: [employer] Date DME Agency Contacted: 12/08/19 Time DME Agency Contacted: [phone] Representative spoke with at DME Agency: Mitchell Heir             Social Determinants of Health (SDOH) Interventions    Readmission Risk Interventions No flowsheet data found.

## 2019-12-09 DIAGNOSIS — J9601 Acute respiratory failure with hypoxia: Secondary | ICD-10-CM

## 2019-12-09 LAB — COMPREHENSIVE METABOLIC PANEL
ALT: 24 U/L (ref 0–44)
AST: 24 U/L (ref 15–41)
Albumin: 3.1 g/dL — ABNORMAL LOW (ref 3.5–5.0)
Alkaline Phosphatase: 46 U/L (ref 38–126)
Anion gap: 9 (ref 5–15)
BUN: 13 mg/dL (ref 6–20)
CO2: 23 mmol/L (ref 22–32)
Calcium: 8.9 mg/dL (ref 8.9–10.3)
Chloride: 108 mmol/L (ref 98–111)
Creatinine, Ser: 0.63 mg/dL (ref 0.44–1.00)
GFR calc Af Amer: >60 mL/min (ref >60)
GFR calc non Af Amer: >60 mL/min (ref >60)
Glucose, Bld: 119 mg/dL — ABNORMAL HIGH (ref 70–99)
Potassium: 3.9 mmol/L (ref 3.5–5.1)
Sodium: 140 mmol/L (ref 135–145)
Total Bilirubin: 0.4 mg/dL (ref 0.3–1.2)
Total Protein: 6.9 g/dL (ref 6.5–8.1)

## 2019-12-09 LAB — CBC WITH DIFFERENTIAL/PLATELET
Abs Immature Granulocytes: 0.12 10*3/uL — ABNORMAL HIGH (ref 0.00–0.07)
Basophils Absolute: 0 10*3/uL (ref 0.0–0.1)
Basophils Relative: 0 %
Eosinophils Absolute: 0 10*3/uL (ref 0.0–0.5)
Eosinophils Relative: 0 %
HCT: 40.7 % (ref 36.0–46.0)
Hemoglobin: 13.3 g/dL (ref 12.0–15.0)
Immature Granulocytes: 2 %
Lymphocytes Relative: 20 %
Lymphs Abs: 1.6 10*3/uL (ref 0.7–4.0)
MCH: 29.6 pg (ref 26.0–34.0)
MCHC: 32.7 g/dL (ref 30.0–36.0)
MCV: 90.6 fL (ref 80.0–100.0)
Monocytes Absolute: 0.6 10*3/uL (ref 0.1–1.0)
Monocytes Relative: 7 %
Neutro Abs: 5.7 10*3/uL (ref 1.7–7.7)
Neutrophils Relative %: 71 %
Platelets: 253 10*3/uL (ref 150–400)
RBC: 4.49 MIL/uL (ref 3.87–5.11)
RDW: 13.6 % (ref 11.5–15.5)
Smear Review: NORMAL
WBC: 8 10*3/uL (ref 4.0–10.5)
nRBC: 0 % (ref 0.0–0.2)

## 2019-12-09 LAB — FIBRIN DERIVATIVES D-DIMER (ARMC ONLY): Fibrin derivatives D-dimer (ARMC): 419.94 ng/mL (FEU) (ref 0.00–499.00)

## 2019-12-09 LAB — MAGNESIUM: Magnesium: 2 mg/dL (ref 1.7–2.4)

## 2019-12-09 LAB — C-REACTIVE PROTEIN: CRP: 2.7 mg/dL — ABNORMAL HIGH (ref <1.0)

## 2019-12-09 LAB — FERRITIN: Ferritin: 322 ng/mL — ABNORMAL HIGH (ref 11–307)

## 2019-12-09 MED ORDER — POLYETHYLENE GLYCOL 3350 17 G PO PACK
17.0000 g | PACK | Freq: Every day | ORAL | Status: DC
  Administered 2019-12-09 – 2019-12-10 (×2): 17 g via ORAL
  Filled 2019-12-09 (×2): qty 1

## 2019-12-09 NOTE — Progress Notes (Signed)
PROGRESS NOTE    Alexandria Moore  JTT:[id number] DOB: 1961-07-13 DOA: 12/05/2019 PCP: Alexandria Lacks, NP      Brief Narrative:  Alexandria Moore is a 59 y.o. F with obesity BMI 41, dCHF, HTN, OSA not on CPAP and hypothyroidism who presented with fever and cough and SOB for 2 days.  In the ER, fever to 101F, CXR showed pneumonia.  COVID Ag positive.  Started on remdesivir and steroids and admitted.          Assessment & Plan:  Coronavirus pneumonitis with acute hypoxic respiratory failure Presented with tachypnea, radiographic pneumonia and SpO2 90% in the setting of the ongoing 2020-2021 COVID-19 pandemic.  CTA showed no PE.  O2 requirements stabilized  -Continue remdesivir, day 5 of 5 -Continue steroids, day 5  -Continue VTE PPx with Lovenox -Continue Zinc and Vitamin C -Flutter valve, turn, cough, incentive spirometry q2hrs while awake   Elevated troponin No chest pain, doubt ACS. Echo showed no RWMA. No further ischemic work up necessary.  Hypertension Chronic diastolic CHF BP controlled, euvolemic -Continue amlodipine, losartan  Hypothyroidism -Continue levothyroxine  OSA Does not use CPAP -Repeat outpatient sleep study  Morbid obesity BMI 41 with hx HTN, OSA  Other medications -Continue gabapentin          Disposition: The patient was admitted with COVID with acute hypoxic respriatory failure from COVID19.    She still requires supplemental oxygen we will continue steroids.  At this time, continued inpatient services are reasonable and expected, given the ongoing supplemental O2, emerging pathogen infection and the high likelihood of an adverse outcome, including readmission, debility or death if the patient were to be discharged prematurely.        DVT prophylaxis: Lovenox Code Status: FULL Family Communication: daughter by phone MDM:  The below labs and imaging reports reviewed and summarized above.  Medication  management as above.       Procedures:   2/23 CTA chest -- pneumonia, no PE  Antimicrobials:   Ceftriaxone x1  Azithromycin x1   Culture data:        Subjective: No leg pain.  No sputum, hemoptysis, fever, confusion.  No respiratory distress.  She is still extremely tired out of breath with exertion.     Objective: Vitals:   12/08/19 0500 12/08/19 0731 12/08/19 1600 12/09/19 0959  BP:  140/69 114/64 111/61  Pulse:  (!) 56 69 60  Resp:  18 18 20   Temp:  98.2 F (36.8 C) 97.9 F (36.6 C)   TempSrc:  Oral Oral   SpO2:  95% 100% 92%  Weight: 111.4 kg     Height:        Intake/Output Summary (Last 24 hours) at 12/09/2019 1514 Last data filed at 12/09/2019 1230 Gross per 24 hour  Intake 840 ml  Output -  Net 840 ml   Filed Weights   12/05/19 0823 12/07/19 0331 12/08/19 0500  Weight: 108.9 kg 106.1 kg 111.4 kg    Examination: General appearance: Obese adult female, alert and in no acute distress.   HEENT: Anicteric, conjunctiva pink, lids and lashes normal. No nasal deformity, discharge, epistaxis.  Lips moist, teeth normal. OP normal, no oral lesions.   Skin: Warm and dry.  No suspicious rashes or lesions. Cardiac: RRR, no murmurs appreciated.  No LE edema.    Respiratory: Dyspneic with exertion, lungs diminished but clear without rales or wheezes. Abdomen: Abdomen soft.  No tenderness to palpation or rigidity or rebound. No  ascites, distension, hepatosplenomegaly.   MSK: No deformities or effusions of the large joints of the upper or lower extremities bilaterally. Neuro: Awake and alert. Naming is grossly intact, and the patient's recall, recent and remote, as well as general fund of knowledge seem within normal limits.  Muscle tone normal, without fasciculations.  Moves all extremities equally and with normal coordination.  Speech fluent.    Psych: Sensorium intact and responding to questions, attention normal. Affect normal.  Judgment and insight appear  normal.                Data Reviewed: I have personally reviewed following labs and imaging studies:  CBC: Recent Labs  Lab 12/05/19 0847 12/06/19 0431 12/07/19 0432 12/08/19 0339 12/09/19 0548  WBC 6.1 4.9 7.9 7.1 8.0  NEUTROABS 5.2 3.6 6.5 5.6 5.7  HGB 13.1 12.1 12.7 12.6 13.3  HCT 39.7 37.2 38.3 38.3 40.7  MCV 91.3 92.1 90.5 90.8 90.6  PLT 145* 135* 184 216 253   Basic Metabolic Panel: Recent Labs  Lab 12/05/19 0847 12/06/19 0431 12/07/19 0432 12/08/19 0339 12/09/19 0548  NA 137 138 139 138 140  K 3.7 3.9 3.9 3.6 3.9  CL 104 108 108 107 108  CO2 22 23 23 23 23   GLUCOSE 106* 116* 153* 160* 119*  BUN 11 14 12 13 13   CREATININE 0.75 0.71 0.56 0.60 0.63  CALCIUM 8.5* 8.4* 8.6* 8.5* 8.9  MG  --  2.1 2.0 1.9 2.0   GFR: Estimated Creatinine Clearance: 91.4 mL/min (by C-G formula based on SCr of 0.63 mg/dL). Liver Function Tests: Recent Labs  Lab 12/06/19 0431 12/07/19 0432 12/08/19 0339 12/09/19 0548  AST 22 22 24 24   ALT 16 18 22 24   ALKPHOS 43 43 43 46  BILITOT 0.5 0.4 0.4 0.4  PROT 6.8 6.7 6.5 6.9  ALBUMIN 3.0* 2.9* 2.9* 3.1*   No results for input(s): LIPASE, AMYLASE in the last 168 hours. No results for input(s): AMMONIA in the last 168 hours. Coagulation Profile: No results for input(s): INR, PROTIME in the last 168 hours. Cardiac Enzymes: No results for input(s): CKTOTAL, CKMB, CKMBINDEX, TROPONINI in the last 168 hours. BNP (last 3 results) No results for input(s): PROBNP in the last 8760 hours. HbA1C: No results for input(s): HGBA1C in the last 72 hours. CBG: No results for input(s): GLUCAP in the last 168 hours. Lipid Profile: No results for input(s): CHOL, HDL, LDLCALC, TRIG, CHOLHDL, LDLDIRECT in the last 72 hours. Thyroid Function Tests: No results for input(s): TSH, T4TOTAL, FREET4, T3FREE, THYROIDAB in the last 72 hours. Anemia Panel: Recent Labs    12/08/19 0339 12/09/19 0548  FERRITIN 299 322*   Urine analysis:     Component Value Date/Time   COLORURINE YELLOW (A) 05/04/2018 0834   APPEARANCEUR HAZY (A) 05/04/2018 0834   LABSPEC 1.025 05/04/2018 0834   PHURINE 5.0 05/04/2018 0834   GLUCOSEU NEGATIVE 05/04/2018 0834   HGBUR NEGATIVE 05/04/2018 0834   BILIRUBINUR NEGATIVE 05/04/2018 0834   KETONESUR NEGATIVE 05/04/2018 0834   PROTEINUR NEGATIVE 05/04/2018 0834   NITRITE NEGATIVE 05/04/2018 0834   LEUKOCYTESUR NEGATIVE 05/04/2018 0834   Sepsis Labs: @LABRCNTIP (procalcitonin:4,lacticacidven:4)  )No results found for this or any previous visit (from the past 240 hour(s)).       Radiology Studies: No results found.      Scheduled Meds: . acetaminophen  1,000 mg Oral Q6H  . amLODipine  10 mg Oral Daily  . vitamin C  500 mg Oral Daily  .  dexamethasone (DECADRON) injection  6 mg Intravenous Daily  . enoxaparin (LOVENOX) injection  40 mg Subcutaneous Q12H  . feeding supplement (ENSURE ENLIVE)  237 mL Oral BID BM  . gabapentin  300 mg Oral TID  . levothyroxine  88 mcg Oral Q0600  . losartan  100 mg Oral Daily  . polyethylene glycol  17 g Oral Daily  . sodium chloride flush  3 mL Intravenous Q12H  . zinc sulfate  220 mg Oral Daily   Continuous Infusions: . sodium chloride       LOS: 4 days    Time spent: 25 minutes      Edwin Dada, MD [facility] 12/09/2019, 3:14 PM     Please page through [place]:  [email or web address] Contact charge nurse for password If 7PM-7AM, please contact night-coverage

## 2019-12-10 LAB — COMPREHENSIVE METABOLIC PANEL
ALT: 56 U/L — ABNORMAL HIGH (ref 0–44)
AST: 56 U/L — ABNORMAL HIGH (ref 15–41)
Albumin: 2.8 g/dL — ABNORMAL LOW (ref 3.5–5.0)
Alkaline Phosphatase: 48 U/L (ref 38–126)
Anion gap: 8 (ref 5–15)
BUN: 15 mg/dL (ref 6–20)
CO2: 23 mmol/L (ref 22–32)
Calcium: 8.9 mg/dL (ref 8.9–10.3)
Chloride: 108 mmol/L (ref 98–111)
Creatinine, Ser: 0.54 mg/dL (ref 0.44–1.00)
GFR calc Af Amer: >60 mL/min (ref >60)
GFR calc non Af Amer: >60 mL/min (ref >60)
Glucose, Bld: 112 mg/dL — ABNORMAL HIGH (ref 70–99)
Potassium: 4.1 mmol/L (ref 3.5–5.1)
Sodium: 139 mmol/L (ref 135–145)
Total Bilirubin: 0.4 mg/dL (ref 0.3–1.2)
Total Protein: 6.5 g/dL (ref 6.5–8.1)

## 2019-12-10 LAB — CBC WITH DIFFERENTIAL/PLATELET
Abs Immature Granulocytes: 0.35 10*3/uL — ABNORMAL HIGH (ref 0.00–0.07)
Basophils Absolute: 0 10*3/uL (ref 0.0–0.1)
Basophils Relative: 0 %
Eosinophils Absolute: 0 10*3/uL (ref 0.0–0.5)
Eosinophils Relative: 0 %
HCT: 39 % (ref 36.0–46.0)
Hemoglobin: 12.7 g/dL (ref 12.0–15.0)
Immature Granulocytes: 4 %
Lymphocytes Relative: 19 %
Lymphs Abs: 1.8 10*3/uL (ref 0.7–4.0)
MCH: 29.3 pg (ref 26.0–34.0)
MCHC: 32.6 g/dL (ref 30.0–36.0)
MCV: 89.9 fL (ref 80.0–100.0)
Monocytes Absolute: 0.7 10*3/uL (ref 0.1–1.0)
Monocytes Relative: 7 %
Neutro Abs: 6.5 10*3/uL (ref 1.7–7.7)
Neutrophils Relative %: 70 %
Platelets: 277 10*3/uL (ref 150–400)
RBC: 4.34 MIL/uL (ref 3.87–5.11)
RDW: 13.9 % (ref 11.5–15.5)
WBC: 9.4 10*3/uL (ref 4.0–10.5)
nRBC: 0 % (ref 0.0–0.2)

## 2019-12-10 LAB — C-REACTIVE PROTEIN: CRP: 1.4 mg/dL — ABNORMAL HIGH (ref <1.0)

## 2019-12-10 LAB — MAGNESIUM: Magnesium: 2.1 mg/dL (ref 1.7–2.4)

## 2019-12-10 LAB — FERRITIN: Ferritin: 338 ng/mL — ABNORMAL HIGH (ref 11–307)

## 2019-12-10 LAB — FIBRIN DERIVATIVES D-DIMER (ARMC ONLY): Fibrin derivatives D-dimer (ARMC): 401.58 ng/mL (FEU) (ref 0.00–499.00)

## 2019-12-10 MED ORDER — DEXAMETHASONE 6 MG PO TABS: 6.0000 mg | ORAL_TABLET | Freq: Every day | ORAL | 0 refills | Status: DC

## 2019-12-10 NOTE — Discharge Summary (Signed)
Physician Discharge Summary  Alexandria Moore:[id number] DOB: 1961-07-01 DOA: 12/05/2019  PCP: Alexandria Lacks, NP  Admit date: 12/05/2019 Discharge date: 12/10/2019  Admitted From: Home  Disposition:  Home with [facility]   Recommendations for Outpatient Follow-up:  1. Follow up with PCP in 2 weeks after isolation period ends 3/16 2. Please obtain LFTs in 2-3 weeks 3. Alexandria Moore: Please wean off O2 as able 4. Alexandria Moore: Please refer for sleep study after resolution of COVID      Home Health: PT/OT due to patient's persistent shortness of breath with exertion Equipment/Devices: Rolling walker, 3 in 1  Discharge Condition: Good CODE STATUS: Full code Diet recommendation: Regular  Brief/Interim Summary: Alexandria Moore is a 59 y.o. F with obesity BMI 41, dCHF, HTN, OSA not on CPAP and hypothyroidism who presented with fever and cough and SOB for 2 days.  In the ER, fever to 101F, CXR showed pneumonia.  COVID Ag positive.  Started on remdesivir and steroids and admitted.     PRINCIPAL HOSPITAL DIAGNOSIS: COVID-19 with acute hypoxic respiratory failure    Discharge Diagnoses:   Coronavirus pneumonitis with acute hypoxic respiratory failure Presented with tachypnea, radiographic pneumonia and SpO2 90% in the setting of the ongoing 2020-2021 COVID-19 pandemic.  CTA chest on admission showed bilateral pneumonia, no PE.  She was started on remdesivir, and completed 5 doses.  She was weaned down to room air at rest, 2 L oxygen with exertion, and was discharged with home health and supplemental oxygen at home.  Continue dexamethasone to complete 10 days.  Wean off oxygen over the next 2 to 4 weeks.    Elevated troponin Minimal elevation, low level and flat.  No chest pain, NSTEMI ruled out. Echo showed no RWMA. No further ischemic work up necessary.  Hypertension Chronic diastolic CHF Continue amlodipine, losartan  Hypothyroidism Continue levothyroxine  OSA Had  some brief nocturnal desaturations, expected in the case of Covid and borderline sleep apnea.  Morbid obesity BMI 41 with hx HTN, OSA            Discharge Instructions  Discharge Instructions    Discharge instructions   Complete by: As directed    From Dr. Maryfrances Moore: You were admitted for coronavirus (Also known as COVID-19)  You were treated with an anti-virus medicine ("remdesivir") and an anti-inflammatory (a "steroid") while you were here.  You completed the course of the anti-virus medicine, remdesivir You should finish the course of steroids by taking dexamethasone 6 mg (1 tab) once daily for 5 more days  If you have any lingering cough, you should take the cough syrup we gave you here, Robitussin (with the ingredients "GUIAFENESIN" and "DEXTROMETHORPHAN")    Use the oxygen all the time until you see your primary care doctor. The simplest thing to do is use the oxygen all the time until you see your primary care doctor.    The purpose of the  oxygen is to keep your oxygen level at 88% or above Use a pulse oximeter to measure your oxygen level.  If the home health agency doesn't bring you one of these, you can find them at any drug store.  You will need more oxygen while walking (or exerting yourself) than while sitting, so even if your oxygen level is MORE than 88% when you are sitting still, you should put it back on when you walk around.  Dr. Hessie Moore can help you decide when it is safe to stop completely.   HOW LONG  TO REMAIN IN QUARANTINE: There is no absolutely correct answer to this and so our best answer is to be on the cautious side.  Based on what we know of the virus, you should isolate strictly until 21 days from your first symptoms. For you, this means isolate until Tuesday Mar 16th  Until you end your quarantine: If you have anyone in the home who has NOT had coronavirus:    -do not be in the same room with them until your self isolation is over     -if you MUST be in the same room, make sure you wear a mask and have them wear a mask and safety glasses (if available)    -clean all hard surfaces (counters, doors, tables) twice a day    -use a separate bathroom at all times     Work hard with physical therapy.  This (and getting adequate food, fluids and nutrition) is the only way you are going to get better.   For now, resume all your home medicines with a few EXCEPTIONS: Take your amlodipine and losartan for blood pressure For now, do NOT take your carvedilol and HCTZ/hydrochlorothiazide When you see your primary care doctor in 2 weeks, ask them if you should restart these   Increase activity slowly   Complete by: As directed      Allergies as of 12/10/2019      Reactions   Hydromorphone Anaphylaxis, Shortness Of Breath   Lisinopril Other (See Comments), Anaphylaxis, Nausea And Vomiting, Nausea Only   Other reaction(s): Abdominal Pain Other reaction(s): Abdominal Pain Other reaction(s): Abdominal Pain Other reaction(s): Abdominal Pain   Meloxicam Anaphylaxis, Other (See Comments)   Weakness in Legs Other reaction(s): Other (See Comments) Weakness in Legs Weakness in Legs   Adhesive [tape] Other (See Comments)   Irritates skin. Paper tape is okay      Medication List    STOP taking these medications   aspirin 325 MG EC tablet   carvedilol 25 MG tablet Commonly known as: COREG   cloNIDine 0.1 MG tablet Commonly known as: CATAPRES   hydrochlorothiazide 12.5 MG capsule Commonly known as: MICROZIDE     TAKE these medications   acetaminophen 500 MG tablet Commonly known as: TYLENOL Take 2 tablets (1,000 mg total) by mouth every 6 (six) hours. What changed:   when to take this  reasons to take this   acyclovir ointment 5 % Commonly known as: ZOVIRAX Apply 1 application topically as needed (for cold sores).   amLODipine 10 MG tablet Commonly known as: NORVASC Take 10 mg by mouth daily.   coal tar 0.5 %  shampoo Commonly known as: NEUTROGENA T-GEL Apply topically at bedtime as needed.   dexamethasone 6 MG tablet Commonly known as: DECADRON Take 1 tablet (6 mg total) by mouth daily.   fluocinolone 0.025 % cream Commonly known as: SYNALAR Apply topically 2 (two) times daily. Apply to scalp twice daily as needed for scaling and itching   gabapentin 300 MG capsule Commonly known as: NEURONTIN Take 300 mg by mouth 3 (three) times daily.   ibuprofen 800 MG tablet Commonly known as: ADVIL Take 800 mg by mouth 3 (three) times daily as needed for moderate pain.   losartan 100 MG tablet Commonly known as: COZAAR Take 100 mg by mouth daily.   methocarbamol 500 MG tablet Commonly known as: ROBAXIN Take 1 tablet (500 mg total) by mouth every 6 (six) hours as needed for muscle spasms.   omeprazole 20  MG capsule Commonly known as: PRILOSEC Take 20 mg by mouth daily.   Synthroid 88 MCG tablet Generic drug: levothyroxine Take 88 mcg by mouth daily.            Durable Medical Equipment  (From admission, onward)         Start     Ordered   12/08/19 1053  DME Oxygen  (Discharge Planning)  Once    Question Answer Comment  Length of Need 6 Months   Mode or (Route) Nasal cannula   Liters per Minute 2   Frequency Continuous (stationary and portable oxygen unit needed)   Oxygen conserving device Yes   Oxygen delivery system Gas      12/08/19 1052         Follow-up Information    Laneta Simmers, NP. Schedule an appointment as soon as possible for a visit in 2 week(s).   Specialty: Nurse Practitioner Contact information: [address]  [place] [address] [phone]          Allergies  Allergen Reactions  . Hydromorphone Anaphylaxis and Shortness Of Breath  . Lisinopril Other (See Comments), Anaphylaxis, Nausea And Vomiting and Nausea Only    Other reaction(s): Abdominal Pain Other reaction(s): Abdominal Pain Other reaction(s): Abdominal Pain Other  reaction(s): Abdominal Pain   . Meloxicam Anaphylaxis and Other (See Comments)    Weakness in Legs Other reaction(s): Other (See Comments) Weakness in Legs Weakness in Legs   . Adhesive [Tape] Other (See Comments)    Irritates skin. Paper tape is okay    Consultations:     Procedures/Studies: CT ANGIO CHEST PE W OR WO CONTRAST  Result Date: 12/05/2019 CLINICAL DATA:  Respiratory failure. Acute onset of shortness of breath. COVID-19 positive on 12/05/2019. EXAM: CT ANGIOGRAPHY CHEST WITH CONTRAST TECHNIQUE: Multidetector CT imaging of the chest was performed using the standard protocol during bolus administration of intravenous contrast. Multiplanar CT image reconstructions and MIPs were obtained to evaluate the vascular anatomy. CONTRAST:  28mL OMNIPAQUE IOHEXOL 350 MG/ML SOLN COMPARISON:  CT scan dated 10/25/2004 FINDINGS: Cardiovascular: No pulmonary emboli. Cardiomegaly. Dilated left ventricle. Minimal pericardial effusion. Mediastinum/Nodes: Multinodular goiter. No significant hilar or mediastinal adenopathy. Esophagus is normal. Lungs/Pleura: Numerous bilateral patchy pulmonary infiltrates, most extensive in the right perihilar region but involving all lobes. No effusions. Upper Abdomen: Hepatomegaly. No acute abnormality. Musculoskeletal: No chest wall abnormality. No acute or significant osseous findings. Review of the MIP images confirms the above findings. IMPRESSION: 1. No pulmonary emboli. 2. Numerous bilateral patchy pulmonary infiltrates consistent with pneumonia. 3. Cardiomegaly with dilated left ventricle. 4. Hepatomegaly. 5. Multinodular goiter. Electronically Signed   By: Lorriane Shire M.D.   On: 12/05/2019 20:01   DG Chest Portable 1 View  Result Date: 12/05/2019 CLINICAL DATA:  Shortness of breath and chest discomfort EXAM: PORTABLE CHEST 1 VIEW COMPARISON:  December 02, 2014 FINDINGS: There is ill-defined airspace opacity in the right upper lobe as well as in both lower  lung regions. There is slight consolidation in the left base. Heart is mildly enlarged with pulmonary vascularity normal. No adenopathy. There is degenerative change in the thoracic spine. No pneumothorax. IMPRESSION: Multifocal pneumonia.  Suspect atypical organism pneumonia. Stable cardiac enlargement.  No evident adenopathy. Electronically Signed   By: Lowella Grip III M.D.   On: 12/05/2019 09:17       Subjective: Patient feeling tired, still fatigued and short of breath with exertion, but no chest pain, sputum, hemoptysis, confusion, fever.  Appetite good.  No  orthopnea or swelling.  Discharge Exam: Vitals:   12/10/19 0040 12/10/19 0759  BP: 134/72 124/66  Pulse: (!) 52 (!) 54  Resp: 17 18  Temp: 98.2 F (36.8 C) 98 F (36.7 C)  SpO2: 96% 94%   Vitals:   12/09/19 1830 12/10/19 0040 12/10/19 0659 12/10/19 0759  BP:  134/72  124/66  Pulse:  (!) 52  (!) 54  Resp:  17  18  Temp:  98.2 F (36.8 C)  98 F (36.7 C)  TempSrc:    Oral  SpO2: 93% 96%  94%  Weight:   108.5 kg   Height:        General: Pt is alert, awake, not in acute distress Cardiovascular: RRR, nl S1-S2, no murmurs appreciated.   No LE edema.   Respiratory: Normal respiratory rate and rhythm.  CTAB without rales or wheezes. Abdominal: Abdomen soft and non-tender.  No distension or HSM.   Neuro/Psych: Strength symmetric in upper and lower extremities.  Judgment and insight appear normal.   The results of significant diagnostics from this hospitalization (including imaging, microbiology, ancillary and laboratory) are listed below for reference.     Microbiology: No results found for this or any previous visit (from the past 240 hour(s)).   Labs: BNP (last 3 results) No results for input(s): BNP in the last 8760 hours. Basic Metabolic Panel: Recent Labs  Lab 12/06/19 0431 12/07/19 0432 12/08/19 0339 12/09/19 0548 12/10/19 0421  NA 138 139 138 140 139  K 3.9 3.9 3.6 3.9 4.1  CL 108 108 107 108  108  CO2 23 23 23 23 23   GLUCOSE 116* 153* 160* 119* 112*  BUN 14 12 13 13 15   CREATININE 0.71 0.56 0.60 0.63 0.54  CALCIUM 8.4* 8.6* 8.5* 8.9 8.9  MG 2.1 2.0 1.9 2.0 2.1   Liver Function Tests: Recent Labs  Lab 12/06/19 0431 12/07/19 0432 12/08/19 0339 12/09/19 0548 12/10/19 0421  AST 22 22 24 24  56*  ALT 16 18 22 24  56*  ALKPHOS 43 43 43 46 48  BILITOT 0.5 0.4 0.4 0.4 0.4  PROT 6.8 6.7 6.5 6.9 6.5  ALBUMIN 3.0* 2.9* 2.9* 3.1* 2.8*   No results for input(s): LIPASE, AMYLASE in the last 168 hours. No results for input(s): AMMONIA in the last 168 hours. CBC: Recent Labs  Lab 12/06/19 0431 12/07/19 0432 12/08/19 0339 12/09/19 0548 12/10/19 0421  WBC 4.9 7.9 7.1 8.0 9.4  NEUTROABS 3.6 6.5 5.6 5.7 6.5  HGB 12.1 12.7 12.6 13.3 12.7  HCT 37.2 38.3 38.3 40.7 39.0  MCV 92.1 90.5 90.8 90.6 89.9  PLT 135* 184 216 253 277   Cardiac Enzymes: No results for input(s): CKTOTAL, CKMB, CKMBINDEX, TROPONINI in the last 168 hours. BNP: Invalid input(s): POCBNP CBG: No results for input(s): GLUCAP in the last 168 hours. D-Dimer No results for input(s): DDIMER in the last 72 hours. Hgb A1c No results for input(s): HGBA1C in the last 72 hours. Lipid Profile No results for input(s): CHOL, HDL, LDLCALC, TRIG, CHOLHDL, LDLDIRECT in the last 72 hours. Thyroid function studies No results for input(s): TSH, T4TOTAL, T3FREE, THYROIDAB in the last 72 hours.  Invalid input(s): FREET3 Anemia work up Recent Labs    12/09/19 0548 12/10/19 0421  FERRITIN 322* 338*   Urinalysis    Component Value Date/Time   COLORURINE YELLOW (A) 05/04/2018 0834   APPEARANCEUR HAZY (A) 05/04/2018 0834   LABSPEC 1.025 05/04/2018 0834   PHURINE 5.0 05/04/2018 0834   GLUCOSEU  NEGATIVE 05/04/2018 0834   HGBUR NEGATIVE 05/04/2018 0834   BILIRUBINUR NEGATIVE 05/04/2018 0834   KETONESUR NEGATIVE 05/04/2018 0834   PROTEINUR NEGATIVE 05/04/2018 0834   NITRITE NEGATIVE 05/04/2018 0834   LEUKOCYTESUR  NEGATIVE 05/04/2018 0834   Sepsis Labs Invalid input(s): PROCALCITONIN,  WBC,  LACTICIDVEN Microbiology No results found for this or any previous visit (from the past 240 hour(s)).   Time coordinating discharge: 35 minutes The [place] controlled substances registry was reviewed for this patient.      SIGNED:   Alberteen Sam, MD  [facility] 12/10/2019, 2:12 PM

## 2019-12-10 NOTE — Progress Notes (Signed)
Pt d/c to home via boyfriend. IV removed intact. VSS. Education completed. All questions answered. All belongings sent with pt.

## 2019-12-10 NOTE — TOC Transition Note (Signed)
Transition of Care [facility]) - CM/SW Discharge Note   Patient Details  Name: Alexandria Moore MRN: [id number] Date of Birth: 24-Dec-1960  Transition of Care [facility]) CM/SW Contact:  Maud Deed, LCSW Phone Number: [phone] 12/10/2019, 3:17 PM   Clinical Narrative:    CSW spoke to patient to reiterate the MD recommendations for [facility] PT and she agreed. Patient medically stable for discharge. CSW arranged HH with [facility]. CSW spoke with Brad at [facility] and arranged for O2 to be delivered to pt's room.       Barriers to Discharge: Continued Medical Work up   Patient Goals and CMS Choice Patient states their goals for this hospitalization and ongoing recovery are:: She does not want to go home too soon and have to come back to the hospital CMS Medicare.gov Compare Post Acute Care list provided to:: Patient Choice offered to / list presented to : Patient  Discharge Placement                       Discharge Plan and Services   Discharge Planning Services: CM Consult Post Acute Care Choice: Home Health          DME Arranged: Oxygen DME Agency: [facility] Date DME Agency Contacted: 12/08/19 Time DME Agency Contacted: 1254 Representative spoke with at DME Agency: Mitchell Heir            Social Determinants of Health (SDOH) Interventions     Readmission Risk Interventions No flowsheet data found.

## 2019-12-25 ENCOUNTER — Other Ambulatory Visit: Payer: Self-pay

## 2020-01-17 ENCOUNTER — Inpatient Hospital Stay
Admission: EM | Admit: 2020-01-17 | Discharge: 2020-01-20 | DRG: 473 | Disposition: A | Discharge status: Home / Self Care | Payer: 59 | Attending: Neurosurgery | Admitting: Neurosurgery

## 2020-01-17 ENCOUNTER — Other Ambulatory Visit: Payer: Self-pay | Admitting: Orthopedic Surgery

## 2020-01-17 ENCOUNTER — Other Ambulatory Visit: Payer: Self-pay

## 2020-01-17 ENCOUNTER — Ambulatory Visit
Admission: RE | Admit: 2020-01-17 | Discharge: 2020-01-17 | Disposition: A | Discharge status: Home / Self Care | Payer: 59 | Source: Ambulatory Visit | Attending: Orthopedic Surgery | Admitting: Orthopedic Surgery

## 2020-01-17 DIAGNOSIS — Z20822 Contact with and (suspected) exposure to covid-19: Secondary | ICD-10-CM | POA: Diagnosis present

## 2020-01-17 DIAGNOSIS — M4802 Spinal stenosis, cervical region: Secondary | ICD-10-CM

## 2020-01-17 DIAGNOSIS — I110 Hypertensive heart disease with heart failure: Secondary | ICD-10-CM | POA: Diagnosis present

## 2020-01-17 DIAGNOSIS — G959 Disease of spinal cord, unspecified: Secondary | ICD-10-CM | POA: Diagnosis present

## 2020-01-17 DIAGNOSIS — Z419 Encounter for procedure for purposes other than remedying health state, unspecified: Secondary | ICD-10-CM

## 2020-01-17 DIAGNOSIS — M2578 Osteophyte, vertebrae: Secondary | ICD-10-CM | POA: Diagnosis present

## 2020-01-17 DIAGNOSIS — K219 Gastro-esophageal reflux disease without esophagitis: Secondary | ICD-10-CM | POA: Diagnosis present

## 2020-01-17 DIAGNOSIS — R05 Cough: Secondary | ICD-10-CM | POA: Diagnosis not present

## 2020-01-17 DIAGNOSIS — Z87891 Personal history of nicotine dependence: Secondary | ICD-10-CM | POA: Diagnosis not present

## 2020-01-17 DIAGNOSIS — E039 Hypothyroidism, unspecified: Secondary | ICD-10-CM | POA: Diagnosis present

## 2020-01-17 DIAGNOSIS — M4712 Other spondylosis with myelopathy, cervical region: Principal | ICD-10-CM | POA: Diagnosis present

## 2020-01-17 DIAGNOSIS — Z79890 Hormone replacement therapy (postmenopausal): Secondary | ICD-10-CM | POA: Diagnosis not present

## 2020-01-17 DIAGNOSIS — I509 Heart failure, unspecified: Secondary | ICD-10-CM | POA: Diagnosis present

## 2020-01-17 DIAGNOSIS — R531 Weakness: Secondary | ICD-10-CM | POA: Diagnosis present

## 2020-01-17 DIAGNOSIS — Z96642 Presence of left artificial hip joint: Secondary | ICD-10-CM | POA: Diagnosis present

## 2020-01-17 DIAGNOSIS — Z791 Long term (current) use of non-steroidal anti-inflammatories (NSAID): Secondary | ICD-10-CM | POA: Diagnosis not present

## 2020-01-17 DIAGNOSIS — Z79899 Other long term (current) drug therapy: Secondary | ICD-10-CM | POA: Diagnosis not present

## 2020-01-17 DIAGNOSIS — G4730 Sleep apnea, unspecified: Secondary | ICD-10-CM | POA: Diagnosis present

## 2020-01-17 DIAGNOSIS — R29898 Other symptoms and signs involving the musculoskeletal system: Secondary | ICD-10-CM

## 2020-01-17 DIAGNOSIS — Z981 Arthrodesis status: Secondary | ICD-10-CM

## 2020-01-17 LAB — BASIC METABOLIC PANEL
Anion gap: 6 (ref 5–15)
BUN: 20 mg/dL (ref 6–20)
CO2: 26 mmol/L (ref 22–32)
Calcium: 9.5 mg/dL (ref 8.9–10.3)
Chloride: 109 mmol/L (ref 98–111)
Creatinine, Ser: 0.65 mg/dL (ref 0.44–1.00)
GFR calc Af Amer: >60 mL/min (ref >60)
GFR calc non Af Amer: >60 mL/min (ref >60)
Glucose, Bld: 103 mg/dL — ABNORMAL HIGH (ref 70–99)
Potassium: 4 mmol/L (ref 3.5–5.1)
Sodium: 141 mmol/L (ref 135–145)

## 2020-01-17 LAB — CBC
HCT: 39.9 % (ref 36.0–46.0)
Hemoglobin: 13.1 g/dL (ref 12.0–15.0)
MCH: 30.5 pg (ref 26.0–34.0)
MCHC: 32.8 g/dL (ref 30.0–36.0)
MCV: 93 fL (ref 80.0–100.0)
Platelets: 291 10*3/uL (ref 150–400)
RBC: 4.29 MIL/uL (ref 3.87–5.11)
RDW: 15.7 % — ABNORMAL HIGH (ref 11.5–15.5)
WBC: 9 10*3/uL (ref 4.0–10.5)
nRBC: 0 % (ref 0.0–0.2)

## 2020-01-17 MED ORDER — LEVOTHYROXINE SODIUM 88 MCG PO TABS
88.0000 ug | ORAL_TABLET | Freq: Every day | ORAL | Status: DC
  Filled 2020-01-17: qty 1

## 2020-01-17 MED ORDER — DOCUSATE SODIUM 100 MG PO CAPS
100.0000 mg | ORAL_CAPSULE | Freq: Two times a day (BID) | ORAL | Status: DC
  Administered 2020-01-18 – 2020-01-20 (×4): 100 mg via ORAL
  Filled 2020-01-17 (×4): qty 1

## 2020-01-17 MED ORDER — ONDANSETRON HCL 4 MG/2ML IJ SOLN: 4.0000 mg | Freq: Four times a day (QID) | INTRAMUSCULAR | Status: DC | PRN

## 2020-01-17 MED ORDER — POTASSIUM CHLORIDE IN NACL 20-0.9 MEQ/L-% IV SOLN
INTRAVENOUS | Status: DC
  Filled 2020-01-17 (×8): qty 1000

## 2020-01-17 MED ORDER — ACETAMINOPHEN 500 MG PO TABS
1000.0000 mg | ORAL_TABLET | Freq: Four times a day (QID) | ORAL | Status: AC | PRN
  Administered 2020-01-18: 1000 mg via ORAL
  Filled 2020-01-17: qty 2

## 2020-01-17 MED ORDER — DEXAMETHASONE 4 MG PO TABS
4.0000 mg | ORAL_TABLET | Freq: Four times a day (QID) | ORAL | Status: DC
  Administered 2020-01-18: 13:00:00 4 mg via ORAL
  Filled 2020-01-17 (×6): qty 1

## 2020-01-17 MED ORDER — ONDANSETRON HCL 4 MG PO TABS: 4.0000 mg | ORAL_TABLET | Freq: Four times a day (QID) | ORAL | Status: DC | PRN

## 2020-01-17 MED ORDER — GABAPENTIN 300 MG PO CAPS
300.0000 mg | ORAL_CAPSULE | Freq: Three times a day (TID) | ORAL | Status: DC
  Administered 2020-01-18 – 2020-01-20 (×6): 300 mg via ORAL
  Filled 2020-01-17 (×6): qty 1

## 2020-01-17 MED ORDER — SODIUM CHLORIDE 0.9% FLUSH
3.0000 mL | Freq: Two times a day (BID) | INTRAVENOUS | Status: DC
  Administered 2020-01-18 – 2020-01-20 (×3): 3 mL via INTRAVENOUS

## 2020-01-17 MED ORDER — METHOCARBAMOL 500 MG PO TABS
500.0000 mg | ORAL_TABLET | Freq: Four times a day (QID) | ORAL | Status: DC | PRN
  Administered 2020-01-17 – 2020-01-19 (×2): 500 mg via ORAL
  Filled 2020-01-17 (×4): qty 1

## 2020-01-17 MED ORDER — BISACODYL 5 MG PO TBEC
5.0000 mg | DELAYED_RELEASE_TABLET | Freq: Every day | ORAL | Status: DC | PRN
  Administered 2020-01-19: 14:00:00 5 mg via ORAL
  Filled 2020-01-17: qty 1

## 2020-01-17 MED ORDER — SODIUM CHLORIDE 0.9% FLUSH
3.0000 mL | INTRAVENOUS | Status: DC | PRN
  Administered 2020-01-18: 02:00:00 3 mL via INTRAVENOUS

## 2020-01-17 MED ORDER — LEVOTHYROXINE SODIUM 88 MCG PO TABS
88.0000 ug | ORAL_TABLET | Freq: Every day | ORAL | Status: DC
  Administered 2020-01-19 – 2020-01-20 (×2): 88 ug via ORAL
  Filled 2020-01-17 (×3): qty 1

## 2020-01-17 MED ORDER — METHOCARBAMOL 1000 MG/10ML IJ SOLN
500.0000 mg | Freq: Four times a day (QID) | INTRAVENOUS | Status: DC | PRN
  Administered 2020-01-18: 10:00:00 500 mg via INTRAVENOUS
  Filled 2020-01-17 (×2): qty 5

## 2020-01-17 MED ORDER — MENTHOL 3 MG MT LOZG
1.0000 | LOZENGE | OROMUCOSAL | Status: DC | PRN
  Administered 2020-01-19 (×2): 3 mg via ORAL
  Filled 2020-01-17 (×2): qty 9

## 2020-01-17 MED ORDER — DEXAMETHASONE SODIUM PHOSPHATE 10 MG/ML IJ SOLN
4.0000 mg | Freq: Four times a day (QID) | INTRAMUSCULAR | Status: DC
  Administered 2020-01-18: 02:00:00 4 mg via INTRAVENOUS
  Filled 2020-01-17 (×5): qty 0.4

## 2020-01-17 MED ORDER — LOSARTAN POTASSIUM 50 MG PO TABS
100.0000 mg | ORAL_TABLET | Freq: Every day | ORAL | Status: DC
  Administered 2020-01-19 – 2020-01-20 (×2): 100 mg via ORAL
  Filled 2020-01-17 (×2): qty 2

## 2020-01-17 MED ORDER — SODIUM CHLORIDE 0.9 % IV SOLN
250.0000 mL | INTRAVENOUS | Status: DC
  Administered 2020-01-18: 07:00:00 250 mL via INTRAVENOUS

## 2020-01-17 MED ORDER — PHENOL 1.4 % MT LIQD
1.0000 | OROMUCOSAL | Status: DC | PRN
  Filled 2020-01-17 (×2): qty 177

## 2020-01-17 MED ORDER — LOSARTAN POTASSIUM 50 MG PO TABS: 100.0000 mg | ORAL_TABLET | Freq: Every day | ORAL | Status: DC

## 2020-01-17 MED ORDER — PANTOPRAZOLE SODIUM 40 MG PO TBEC
40.0000 mg | DELAYED_RELEASE_TABLET | Freq: Every day | ORAL | Status: DC
  Administered 2020-01-19 – 2020-01-20 (×2): 40 mg via ORAL
  Filled 2020-01-17 (×2): qty 1

## 2020-01-17 NOTE — ED Notes (Addendum)
Pt states having right shoulder pain and that she is unable to lift her right arm. Pt has 2+ pulses in right arm and cap refill <3 secs. Grip strength weak.

## 2020-01-17 NOTE — ED Triage Notes (Addendum)
Pt comes with c/o right shoulder pain. Pt states she was told to come here to be admitted. Pt states she was having an outpatient MRI.  Pt states they told her she would have surgery tomorrow.  Pt states right pain and difficulty lifting her arm.  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 There is also increased signal within the cord centrally which could be due to early myelomalacia. Severe bilateral neural foraminal narrowing and moderate central canal stenosis is seen.

## 2020-01-17 NOTE — H&P (Signed)
Referring Physician:  No referring provider defined for this encounter.  Primary Physician:  Evie Lacks, NP  Chief Complaint:  Weakness of R arm  History of Present Illness: 01/17/2020 Alexandria Moore is a 59 y.o. female who presents with the chief complaint of R arm weakness.   She began having issues on Friday April 2nd, and was due to be seen in neurology early this week.  She presented to [facility] in orthopedics today with severe RUE weakness and imbalance.  Due to the concerning nature of her findings, she was sent for urgent MRI scan.  I was asked to see her urgently for evaluation.  Ms. Tuohy reports several days of worsening RUE weakness and imbalance.  She is dropping items with her R arm, and has altered sensation.  She reports neck pain as well.  This is somewhat complicated by prior carpal tunnel syndrome, but her symptoms are much worse than she had with prior carpal tunnel syndrome.  She has not had any falls, but feels that she is very unsteady on her feet.  She is very concerned about the level of dysfunction.  Alexandria Moore has clear and progressive symptoms of cervical myelopathy.  Conservative measures: Physical therapy: none Medications: none Injections: none  The symptoms are causing a significant impact on the patient's life.   Review of Systems:  A 10 point review of systems is negative, except for the pertinent positives and negatives detailed in the HPI.  Past Medical History: Past Medical History:  Diagnosis Date  . Arthritis   . CHF (congestive heart failure) (HCC)   . Dyspnea    not so much now since weight loss  . GERD (gastroesophageal reflux disease)   . Heart murmur    not being followed or treated. has lost weight with improvement of murmur  . History of blood transfusion    unsure of when she had transfusion but states she had a terrible reaction and needed medication  . Hypertension   . Hypothyroidism    not on  meds at this time  . Sleep apnea    does not use cpap since weight loss    Past Surgical History: Past Surgical History:  Procedure Laterality Date  . CESAREAN SECTION  1982   x 1  . TOTAL HIP ARTHROPLASTY Left 05/12/2018   Procedure: TOTAL HIP ARTHROPLASTY ANTERIOR APPROACH;  Surgeon: Kennedy Bucker, MD;  Location: [facility] ORS;  Service: Orthopedics;  Laterality: Left;    Allergies: Allergies as of 01/17/2020 - Review Complete 01/17/2020  Allergen Reaction Noted  . Hydromorphone Anaphylaxis and Shortness Of Breath 06/03/2016  . Lisinopril Other (See Comments), Anaphylaxis, Nausea And Vomiting, and Nausea Only 06/03/2016  . Meloxicam Anaphylaxis and Other (See Comments) 06/03/2016  . Adhesive [tape] Other (See Comments) 05/04/2018    Medications: No current facility-administered medications for this encounter.  Current Outpatient Medications:  .  acetaminophen (TYLENOL) 500 MG tablet, Take 2 tablets (1,000 mg total) by mouth every 6 (six) hours. (Patient taking differently: Take 1,000 mg by mouth every 6 (six) hours as needed for mild pain. ), Disp: 30 tablet, Rfl: 0 .  acyclovir ointment (ZOVIRAX) 5 %, Apply 1 application topically as needed (for cold sores)., Disp: , Rfl:  .  amLODipine (NORVASC) 10 MG tablet, Take 10 mg by mouth daily. , Disp: , Rfl:  .  coal tar (NEUTROGENA T-GEL) 0.5 % shampoo, Apply topically at bedtime as needed., Disp: , Rfl:  .  dexamethasone (DECADRON) 6  MG tablet, Take 1 tablet (6 mg total) by mouth daily., Disp: 5 tablet, Rfl: 0 .  fluocinolone (SYNALAR) 0.025 % cream, Apply topically 2 (two) times daily. Apply to scalp twice daily as needed for scaling and itching, Disp: , Rfl:  .  gabapentin (NEURONTIN) 300 MG capsule, Take 300 mg by mouth 3 (three) times daily., Disp: , Rfl:  .  ibuprofen (ADVIL) 800 MG tablet, Take 800 mg by mouth 3 (three) times daily as needed for moderate pain. , Disp: , Rfl:  .  losartan (COZAAR) 100 MG tablet, Take 100 mg by mouth  daily. , Disp: , Rfl:  .  methocarbamol (ROBAXIN) 500 MG tablet, Take 1 tablet (500 mg total) by mouth every 6 (six) hours as needed for muscle spasms., Disp: 20 tablet, Rfl: 0 .  omeprazole (PRILOSEC) 20 MG capsule, Take 20 mg by mouth daily., Disp: , Rfl:  .  SYNTHROID 88 MCG tablet, Take 88 mcg by mouth daily., Disp: , Rfl:    Social History: Social History   Tobacco Use  . Smoking status: Former Smoker    Packs/day: 0.25    Types: Cigarettes    Quit date: 2014    Years since quitting: 7.2  . Smokeless tobacco: Never Used  Substance Use Topics  . Alcohol use: Yes  . Drug use: Never    Family Medical History: Family History  Problem Relation Age of Onset  . Breast cancer Neg Hx     Physical Examination: Vitals:   01/17/20 1805  BP: (!) 152/70  Pulse: 79  Resp: 18  Temp: 98.2 F (36.8 C)  SpO2: 98%     General: Patient is well developed, well nourished, calm, collected, and in no apparent distress.  Psychiatric: Patient is anxious.  Head:  Pupils equal, round, and reactive to light.  ENT:  Oral mucosa appears well hydrated.  Neck:   Supple.  Decreased extension  Respiratory: Patient is breathing without any difficulty.  Extremities: No edema.  Vascular: Palpable pulses in dorsal pedal vessels.  Skin:   On exposed skin, there are no abnormal skin lesions.  Heart sounds normal no MRG. Chest Clear to Auscultation Bilaterally.   NEUROLOGICAL:  General: In no acute distress.   Awake, alert, oriented to person, place, and time.  Pupils equal round and reactive to light.  Facial tone is symmetric.  Tongue protrusion is midline.  There is no pronator drift.  ROM of spine: diminished extension. Strength: Side Biceps Triceps Deltoid Interossei Grip Wrist Ext. Wrist Flex.  R 4- 4+ 2 4+ 4+ 4+ 5  L 5 5 5 5 5 5 5    Side Iliopsoas Quads Hamstring PF DF EHL  R 4+ 5 5 5 5 5   L 5 5 5 5 5 5    Reflexes are 3+ and symmetric at the biceps, triceps,  brachioradialis, patella and achilles.   Bilateral upper and lower extremity sensation is intact to light touch and pin prick.  Clonus is present.  Toes are down-going.  Gait is wide-based and abnormal.  Moderate to severe difficulty with tandem gait.  Hoffman's is present bilaterally.  Imaging: MRI Cervical spine 01/17/2020 IMPRESSION: Cervical spine spondylosis most notable at C4-C5 with a left lateral recess disc extrusion heading in the cranial direction which contacts and impinges the exiting left C5 nerve root. There is also increased signal within the cord centrally which could be due to early myelomalacia. Severe bilateral neural foraminal narrowing and moderate central canal stenosis is seen.  These results will be called to the ordering clinician or representative by the Radiologist Assistant, and communication documented in the PACS or [employer].   Electronically Signed   By: Jonna Clark M.D.   On: 01/17/2020 14:25  I have personally reviewed the images and agree with the above interpretation.  Labs: CBC Latest Ref Rng & Units 01/17/2020 12/10/2019 12/09/2019  WBC 4.0 - 10.5 K/uL 9.0 9.4 8.0  Hemoglobin 12.0 - 15.0 g/dL 43.3 29.5 18.8  Hematocrit 36.0 - 46.0 % 39.9 39.0 40.7  Platelets 150 - 400 K/uL 291 277 253       Assessment and Plan: Ms. Brotzman is a pleasant 58 y.o. female with progressive cervical myelopathy.  She has worsening symptoms that have occurred over the past 5 days.    Due to worsening symptoms, myelomalacia, and severe stenosis at C4-5, I recommended surgical intervention.  Due to the severity of her symptoms and the presence of myelomalacia and symptoms of myelopathy, conservative management is not indicated. Her decline in function has been dramatic and rapid, and requires urgent surgical intervention.  We will plan on surgery as soon as the operating room is available.  I recommended C4-5 anterior cervical discectomy and fusion.  I  discussed the planned procedure at length with the patient, including the risks, benefits, alternatives, and indications. The risks discussed include but are not limited to bleeding, infection, need for reoperation, spinal fluid leak, stroke, vision loss, anesthetic complication, coma, paralysis, and even death. We also discussed the possibility of post-operative dysphagia, vocal cord paralysis, and the risk of adjacent segment disease in the future. I also described in detail that improvement was not guaranteed.  The patient expressed understanding of these risks, and asked that we proceed with surgery. I described the surgery in layman's terms, and gave ample opportunity for questions, which were answered to the best of my ability.   Jennalynn Rivard K. Myer Haff MD, MPHS Dept. of Neurosurgery

## 2020-01-17 NOTE — ED Notes (Signed)
Label maker not working, sent blood with white chart labels.

## 2020-01-17 NOTE — ED Provider Notes (Signed)
[facility] Emergency Department Provider Note   ____________________________________________   First MD Initiated Contact with Patient 01/17/20 2019     (approximate)  I have reviewed the triage vital signs and the nursing notes.   HISTORY  Chief Complaint Shoulder Pain    HPI Alexandria Moore is a 59 y.o. female with past medical history of hypertension, CHF, and GERD who presents to the ED complaining of shoulder pain.  Patient reports that she has had increasing pain in her neck and right shoulder over the past couple of weeks.  She was initially having difficulties with her right hand, which was attributed to carpal tunnel.  She then had increasing weakness in her right proximal arm with difficulty raising her right arm at her shoulder developing over the past 2 days.  She was referred for further evaluation and had MRI as an outpatient today that showed significant cervical stenosis.  She was referred to the ED for further evaluation by neurosurgery and potential surgical intervention.        Past Medical History:  Diagnosis Date  . Arthritis   . CHF (congestive heart failure) ([place])   . Dyspnea    not so much now since weight loss  . GERD (gastroesophageal reflux disease)   . Heart murmur    not being followed or treated. has lost weight with improvement of murmur  . History of blood transfusion    unsure of when she had transfusion but states she had a terrible reaction and needed medication  . Hypertension   . Hypothyroidism    not on meds at this time  . Sleep apnea    does not use cpap since weight loss    Patient Active Problem List   Diagnosis Date Noted  . Cervical myelopathy ([place]) 01/17/2020  . Essential hypertension 12/05/2019  . Pneumonia due to COVID-19 virus 12/05/2019  . Acute respiratory failure with hypoxia ([place]) 12/05/2019  . Congestive heart failure ([place]) 12/05/2019  . Thrombocytopenia ([place]) 12/05/2019  . Elevated  troponin level 12/05/2019  . 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 [place]   x 1  . TOTAL HIP ARTHROPLASTY Left 05/12/2018   Procedure: TOTAL HIP ARTHROPLASTY ANTERIOR APPROACH;  Surgeon: Hessie Knows, MD;  Location: [facility] ORS;  Service: Orthopedics;  Laterality: Left;    Prior to Admission medications   Medication Sig Start Date End Date Taking? Authorizing Provider  acetaminophen (TYLENOL) 500 MG tablet Take 2 tablets (1,000 mg total) by mouth every 6 (six) hours. Patient taking differently: Take 1,000 mg by mouth every 6 (six) hours as needed for mild pain.  05/13/18   Duanne Guess, PA-C  acyclovir ointment (ZOVIRAX) 5 % Apply 1 application topically as needed (for cold sores).    [provider]  amLODipine (NORVASC) 10 MG tablet Take 10 mg by mouth daily.     [provider]  coal tar (NEUTROGENA T-GEL) 0.5 % shampoo Apply topically at bedtime as needed.    [provider]  dexamethasone (DECADRON) 6 MG tablet Take 1 tablet (6 mg total) by mouth daily. 12/10/19   Danford, Suann Larry, MD  fluocinolone (SYNALAR) 0.025 % cream Apply topically 2 (two) times daily. Apply to scalp twice daily as needed for scaling and itching    [provider]  gabapentin (NEURONTIN) 300 MG capsule Take 300 mg by mouth 3 (three) times daily. 11/06/19  [provider]  ibuprofen (ADVIL) 800 MG tablet Take 800 mg by mouth 3 (three) times daily as needed for moderate pain.  10/31/19   [provider]  losartan (COZAAR) 100 MG tablet Take 100 mg by mouth daily.     [provider]  methocarbamol (ROBAXIN) 500 MG tablet Take 1 tablet (500 mg total) by mouth every 6 (six) hours as needed for muscle spasms. 05/13/18   Evon Slack, PA-C  omeprazole (PRILOSEC) 20 MG capsule Take 20 mg by mouth daily.    [provider]  SYNTHROID 88 MCG tablet Take 88 mcg by  mouth daily. 06/16/19   [provider]    Allergies Hydromorphone, Lisinopril, Meloxicam, and Adhesive [tape]  Family History  Problem Relation Age of Onset  . Breast cancer Neg Hx     Social History Social History   Tobacco Use  . Smoking status: Former Smoker    Packs/day: 0.25    Types: Cigarettes    Quit date: 2014    Years since quitting: 7.2  . Smokeless tobacco: Never Used  Substance Use Topics  . Alcohol use: Yes  . Drug use: Never    Review of Systems  Constitutional: No fever/chills Eyes: No visual changes. ENT: No sore throat. Cardiovascular: Denies chest pain. Respiratory: Denies shortness of breath. Gastrointestinal: No abdominal pain.  No nausea, no vomiting.  No diarrhea.  No constipation. Genitourinary: Negative for dysuria. Musculoskeletal: Negative for back pain.  Positive for neck and shoulder pain. Skin: Negative for rash. Neurological: Negative for headaches, positive for right arm numbness and weakness.  ____________________________________________   PHYSICAL EXAM:  VITAL SIGNS: ED Triage Vitals [01/17/20 1805]  Enc Vitals Group     BP (!) 152/70     Pulse Rate 79     Resp 18     Temp 98.2 F (36.8 C)     Temp src      SpO2 98 %     Weight 253 lb (114.8 kg)     Height 5\' 4"  (1.626 m)     Head Circumference      Peak Flow      Pain Score 8     Pain Loc      Pain Edu?      Excl. in GC?     Constitutional: Alert and oriented. Eyes: Conjunctivae are normal. Head: Atraumatic. Nose: No congestion/rhinnorhea. Mouth/Throat: Mucous membranes are moist. Neck: Normal ROM Cardiovascular: Normal rate, regular rhythm. Grossly normal heart sounds. Respiratory: Normal respiratory effort.  No retractions. Lungs CTAB. Gastrointestinal: Soft and nontender. No distention. Genitourinary: deferred Musculoskeletal: No lower extremity tenderness nor edema. Neurologic:  Normal speech and language.  2 out of 5 strength with right shoulder  abduction and right elbow flexion.  4-5 strength grip strength in right hand.  5-5 strength in left upper extremity.  Diminished sensation over area of right deltoid. Skin:  Skin is warm, dry and intact. No rash noted. Psychiatric: Mood and affect are normal. Speech and behavior are normal.  ____________________________________________   LABS (all labs ordered are listed, but only abnormal results are displayed)  Labs Reviewed  CBC - Abnormal; Notable for the following components:      Result Value   RDW 15.7 (*)    All other components within normal limits  BASIC METABOLIC PANEL - Abnormal; Notable for the following components:   Glucose, Bld 103 (*)    All other components within normal limits  URINALYSIS, ROUTINE W REFLEX MICROSCOPIC  TYPE AND SCREEN    PROCEDURES  Procedure(s) performed (including Critical Care):  Procedures   ____________________________________________   INITIAL IMPRESSION / ASSESSMENT AND PLAN / ED COURSE       59 year old female presents to the ED with increasing pain in her neck, right shoulder, and right arm with increasing difficulty lifting her right arm over the past couple of days.  Outpatient MRI showed significant cervical stenosis and patient referred to the ED for potential neurosurgical intervention.  Dr. Marcell Barlow of neurosurgery is here in the ED at bedside and will plan for admission and operative intervention in the morning.  Patient's pain is reasonably well controlled at this time and screening labs are unremarkable.  Patient to be admitted to the neurosurgical service.  Patient previously tested positive for Covid on February 23 and all symptoms have since resolved.  Given she is within 90 days of positive testing, either precautions or repeat testing are indicated.      ____________________________________________   FINAL CLINICAL IMPRESSION(S) / ED DIAGNOSES  Final diagnoses:  Right arm weakness  Cervical stenosis of spine       ED Discharge Orders         Ordered    Incentive spirometry RT     01/17/20 2126           Note:  This document was prepared using Dragon voice recognition software and may include unintentional dictation errors.   Chesley Noon, MD 01/17/20 2129

## 2020-01-18 ENCOUNTER — Inpatient Hospital Stay: Payer: 59 | Admitting: Anesthesiology

## 2020-01-18 ENCOUNTER — Other Ambulatory Visit: Payer: Self-pay

## 2020-01-18 ENCOUNTER — Encounter
Admission: EM | Disposition: A | Discharge status: Home / Self Care | Payer: Self-pay | Source: Home / Self Care | Attending: Neurosurgery

## 2020-01-18 ENCOUNTER — Encounter: Payer: Self-pay | Admitting: Neurosurgery

## 2020-01-18 ENCOUNTER — Inpatient Hospital Stay: Payer: 59

## 2020-01-18 HISTORY — PX: ANTERIOR CERVICAL DECOMP/DISCECTOMY FUSION: SHX1161

## 2020-01-18 LAB — URINALYSIS, ROUTINE W REFLEX MICROSCOPIC
Bilirubin Urine: NEGATIVE
Glucose, UA: 50 mg/dL — AB
Hgb urine dipstick: NEGATIVE
Ketones, ur: NEGATIVE mg/dL
Leukocytes,Ua: NEGATIVE
Nitrite: NEGATIVE
Protein, ur: NEGATIVE mg/dL
Specific Gravity, Urine: 1.023 (ref 1.005–1.030)
pH: 7 (ref 5.0–8.0)

## 2020-01-18 LAB — SURGICAL PCR SCREEN
MRSA, PCR: NEGATIVE
Staphylococcus aureus: POSITIVE — AB

## 2020-01-18 LAB — GLUCOSE, CAPILLARY
Glucose-Capillary: 97 mg/dL (ref 70–99)
Glucose-Capillary: 128 mg/dL — ABNORMAL HIGH (ref 70–99)

## 2020-01-18 SURGERY — ANTERIOR CERVICAL DECOMPRESSION/DISCECTOMY FUSION 1 LEVEL
Anesthesia: General

## 2020-01-18 MED ORDER — FENTANYL CITRATE (PF) 100 MCG/2ML IJ SOLN
INTRAMUSCULAR | Status: AC
  Administered 2020-01-18: 11:00:00 50 ug via INTRAVENOUS
  Filled 2020-01-18: qty 2

## 2020-01-18 MED ORDER — DEXAMETHASONE SODIUM PHOSPHATE 10 MG/ML IJ SOLN
INTRAMUSCULAR | Status: DC | PRN
  Administered 2020-01-18: 10 mg via INTRAVENOUS

## 2020-01-18 MED ORDER — MUPIROCIN 2 % EX OINT: 1.0000 "application " | TOPICAL_OINTMENT | Freq: Two times a day (BID) | CUTANEOUS | Status: DC

## 2020-01-18 MED ORDER — METOPROLOL TARTRATE 5 MG/5ML IV SOLN: 2.5000 mg | Freq: Once | INTRAVENOUS | Status: AC

## 2020-01-18 MED ORDER — FENTANYL CITRATE (PF) 100 MCG/2ML IJ SOLN
25.0000 ug | INTRAMUSCULAR | Status: DC | PRN
  Administered 2020-01-18 (×3): 25 ug via INTRAVENOUS

## 2020-01-18 MED ORDER — PROMETHAZINE HCL 25 MG/ML IJ SOLN: 6.2500 mg | INTRAMUSCULAR | Status: DC | PRN

## 2020-01-18 MED ORDER — THROMBIN 5000 UNITS EX SOLR
CUTANEOUS | Status: DC | PRN
  Administered 2020-01-18: 5000 [IU] via TOPICAL

## 2020-01-18 MED ORDER — PROPOFOL 10 MG/ML IV BOLUS
INTRAVENOUS | Status: AC
  Filled 2020-01-18: qty 20

## 2020-01-18 MED ORDER — LACTATED RINGERS IV SOLN: INTRAVENOUS | Status: DC | PRN

## 2020-01-18 MED ORDER — CHLORHEXIDINE GLUCONATE CLOTH 2 % EX PADS
6.0000 | MEDICATED_PAD | Freq: Once | CUTANEOUS | Status: AC
  Administered 2020-01-18: 05:00:00 6 via TOPICAL

## 2020-01-18 MED ORDER — EPHEDRINE SULFATE 50 MG/ML IJ SOLN
INTRAMUSCULAR | Status: DC | PRN
  Administered 2020-01-18: 10 mg via INTRAVENOUS

## 2020-01-18 MED ORDER — BUPIVACAINE-EPINEPHRINE (PF) 0.5% -1:200000 IJ SOLN
INTRAMUSCULAR | Status: DC | PRN
  Administered 2020-01-18: 8 mL via PERINEURAL

## 2020-01-18 MED ORDER — ONDANSETRON HCL 4 MG/2ML IJ SOLN
INTRAMUSCULAR | Status: DC | PRN
  Administered 2020-01-18: 4 mg via INTRAVENOUS

## 2020-01-18 MED ORDER — REMIFENTANIL HCL 1 MG IV SOLR
INTRAVENOUS | Status: AC
  Filled 2020-01-18: qty 1000

## 2020-01-18 MED ORDER — PHENYLEPHRINE HCL (PRESSORS) 10 MG/ML IV SOLN
INTRAVENOUS | Status: DC | PRN
  Administered 2020-01-18 (×3): 100 ug via INTRAVENOUS

## 2020-01-18 MED ORDER — ACETAMINOPHEN 160 MG/5ML PO SOLN
325.0000 mg | ORAL | Status: DC | PRN
  Filled 2020-01-18: qty 20.3

## 2020-01-18 MED ORDER — REMIFENTANIL HCL 1 MG IV SOLR
INTRAVENOUS | Status: DC | PRN
  Administered 2020-01-18: .1 ug/kg/min via INTRAVENOUS

## 2020-01-18 MED ORDER — PROPOFOL 10 MG/ML IV BOLUS
INTRAVENOUS | Status: AC
  Filled 2020-01-18: qty 40

## 2020-01-18 MED ORDER — CHLORHEXIDINE GLUCONATE CLOTH 2 % EX PADS
6.0000 | MEDICATED_PAD | Freq: Every day | CUTANEOUS | Status: DC
  Administered 2020-01-20: 09:00:00 6 via TOPICAL

## 2020-01-18 MED ORDER — ACETAMINOPHEN 325 MG PO TABS: 325.0000 mg | ORAL_TABLET | ORAL | Status: DC | PRN

## 2020-01-18 MED ORDER — LIDOCAINE HCL (CARDIAC) PF 100 MG/5ML IV SOSY
PREFILLED_SYRINGE | INTRAVENOUS | Status: DC | PRN
  Administered 2020-01-18: 100 mg via INTRAVENOUS

## 2020-01-18 MED ORDER — SUCCINYLCHOLINE CHLORIDE 20 MG/ML IJ SOLN
INTRAMUSCULAR | Status: DC | PRN
  Administered 2020-01-18: 120 mg via INTRAVENOUS

## 2020-01-18 MED ORDER — PHENYLEPHRINE HCL-NACL 20-0.9 MG/250ML-% IV SOLN
INTRAVENOUS | Status: DC | PRN
  Administered 2020-01-18: 20 ug/min via INTRAVENOUS

## 2020-01-18 MED ORDER — GLYCOPYRROLATE 0.2 MG/ML IJ SOLN
INTRAMUSCULAR | Status: DC | PRN
  Administered 2020-01-18: .2 mg via INTRAVENOUS

## 2020-01-18 MED ORDER — THROMBIN 5000 UNITS EX SOLR
CUTANEOUS | Status: AC
  Filled 2020-01-18: qty 10000

## 2020-01-18 MED ORDER — FENTANYL CITRATE (PF) 100 MCG/2ML IJ SOLN
INTRAMUSCULAR | Status: AC
  Filled 2020-01-18: qty 2

## 2020-01-18 MED ORDER — MEPERIDINE HCL 50 MG/ML IJ SOLN: 6.2500 mg | INTRAMUSCULAR | Status: DC | PRN

## 2020-01-18 MED ORDER — ACETAMINOPHEN 10 MG/ML IV SOLN: 1000.0000 mg | Freq: Once | INTRAVENOUS | Status: DC | PRN

## 2020-01-18 MED ORDER — SODIUM CHLORIDE FLUSH 0.9 % IV SOLN
INTRAVENOUS | Status: AC
  Filled 2020-01-18: qty 10

## 2020-01-18 MED ORDER — TRAMADOL HCL 50 MG PO TABS
50.0000 mg | ORAL_TABLET | Freq: Four times a day (QID) | ORAL | Status: DC | PRN
  Administered 2020-01-18 – 2020-01-20 (×6): 50 mg via ORAL
  Filled 2020-01-18 (×6): qty 1

## 2020-01-18 MED ORDER — SODIUM CHLORIDE 0.9 % IR SOLN: Status: DC | PRN

## 2020-01-18 MED ORDER — FENTANYL CITRATE (PF) 100 MCG/2ML IJ SOLN
INTRAMUSCULAR | Status: AC
  Administered 2020-01-18: 25 ug via INTRAVENOUS
  Filled 2020-01-18: qty 2

## 2020-01-18 MED ORDER — METOPROLOL TARTRATE 5 MG/5ML IV SOLN
INTRAVENOUS | Status: AC
  Administered 2020-01-18: 11:00:00 2 mg via INTRAVENOUS
  Filled 2020-01-18: qty 5

## 2020-01-18 MED ORDER — BACITRACIN 50000 UNITS IM SOLR
INTRAMUSCULAR | Status: AC
  Filled 2020-01-18: qty 1

## 2020-01-18 MED ORDER — MENTHOL 3 MG MT LOZG: 1.0000 | LOZENGE | OROMUCOSAL | Status: DC | PRN

## 2020-01-18 MED ORDER — FAMOTIDINE 20 MG PO TABS
ORAL_TABLET | ORAL | Status: AC
  Administered 2020-01-18: 20 mg
  Filled 2020-01-18: qty 1

## 2020-01-18 MED ORDER — VANCOMYCIN HCL 2000 MG/400ML IV SOLN
2000.0000 mg | Freq: Once | INTRAVENOUS | Status: AC
  Administered 2020-01-18: 07:00:00 2000 mg via INTRAVENOUS
  Filled 2020-01-18: qty 400

## 2020-01-18 MED ORDER — FENTANYL CITRATE (PF) 100 MCG/2ML IJ SOLN
INTRAMUSCULAR | Status: DC | PRN
  Administered 2020-01-18: 25 ug via INTRAVENOUS
  Administered 2020-01-18: 75 ug via INTRAVENOUS
  Administered 2020-01-18: 25 ug via INTRAVENOUS
  Administered 2020-01-18: 75 ug via INTRAVENOUS

## 2020-01-18 MED ORDER — PROPOFOL 10 MG/ML IV BOLUS
INTRAVENOUS | Status: DC | PRN
  Administered 2020-01-18: 50 mg via INTRAVENOUS
  Administered 2020-01-18: 150 mg via INTRAVENOUS

## 2020-01-18 MED ORDER — BUPIVACAINE HCL (PF) 0.5 % IJ SOLN
INTRAMUSCULAR | Status: AC
  Filled 2020-01-18: qty 30

## 2020-01-18 SURGICAL SUPPLY — 59 items
BASKET BONE COLLECTION (BASKET) IMPLANT
BONE CERV LORDOTIC 14.5X12X7 (Bone Implant) ×2 IMPLANT
BULB RESERV EVAC DRAIN JP 100C (MISCELLANEOUS) IMPLANT
BUR NEURO DRILL SOFT 3.0X3.8M (BURR) ×2 IMPLANT
CANISTER SUCT 1200ML W/VALVE (MISCELLANEOUS) ×4 IMPLANT
CHLORAPREP W/TINT 26 (MISCELLANEOUS) ×4 IMPLANT
COUNTER NEEDLE 20/40 LG (NEEDLE) ×2 IMPLANT
COVER LIGHT HANDLE STERIS (MISCELLANEOUS) ×4 IMPLANT
COVER WAND RF STERILE (DRAPES) ×2 IMPLANT
CRADLE LAMINECT ARM (MISCELLANEOUS) ×2 IMPLANT
CUP MEDICINE 2OZ PLAST GRAD ST (MISCELLANEOUS) ×2 IMPLANT
DERMABOND ADVANCED (GAUZE/BANDAGES/DRESSINGS) ×1
DERMABOND ADVANCED .7 DNX12 (GAUZE/BANDAGES/DRESSINGS) ×1 IMPLANT
DRAIN CHANNEL JP 10F RND 20C F (MISCELLANEOUS) IMPLANT
DRAPE C-ARM 42X72 X-RAY (DRAPES) ×4 IMPLANT
DRAPE LAPAROTOMY 77X122 PED (DRAPES) ×2 IMPLANT
DRAPE MICROSCOPE SPINE 48X150 (DRAPES) ×2 IMPLANT
DRAPE POUCH INSTRU U-SHP 10X18 (DRAPES) ×2 IMPLANT
DRAPE SURG 17X11 SM STRL (DRAPES) ×8 IMPLANT
ELECT CAUTERY BLADE TIP 2.5 (TIP) ×2
ELECT REM PT RETURN 9FT ADLT (ELECTROSURGICAL) ×2
ELECTRODE CAUTERY BLDE TIP 2.5 (TIP) ×1 IMPLANT
ELECTRODE REM PT RTRN 9FT ADLT (ELECTROSURGICAL) ×1 IMPLANT
FEE INTRAOP MONITOR IMPULS NCS (MISCELLANEOUS) IMPLANT
FRAME EYE SHIELD (PROTECTIVE WEAR) ×4 IMPLANT
GLOVE BIOGEL PI IND STRL 7.0 (GLOVE) ×1 IMPLANT
GLOVE BIOGEL PI INDICATOR 7.0 (GLOVE) ×1
GLOVE SURG SYN 7.0 (GLOVE) ×4 IMPLANT
GLOVE SURG SYN 8.5  E (GLOVE) ×3
GLOVE SURG SYN 8.5 E (GLOVE) ×3 IMPLANT
GOWN SRG XL LVL 3 NONREINFORCE (GOWNS) ×1 IMPLANT
GOWN STRL NON-REIN TWL XL LVL3 (GOWNS) ×1
GOWN STRL REUS W/TWL MED LVL3 (GOWN DISPOSABLE) ×6 IMPLANT
GRADUATE 1200CC STRL 31836 (MISCELLANEOUS) ×2 IMPLANT
INTRAOP MONITOR FEE IMPULS NCS (MISCELLANEOUS)
INTRAOP MONITOR FEE IMPULSE (MISCELLANEOUS)
KIT TURNOVER KIT A (KITS) ×2 IMPLANT
MARKER SKIN DUAL TIP RULER LAB (MISCELLANEOUS) ×4 IMPLANT
NDL SAFETY ECLIPSE 18X1.5 (NEEDLE) ×1 IMPLANT
NEEDLE HYPO 18GX1.5 SHARP (NEEDLE) ×1
NEEDLE HYPO 22GX1.5 SAFETY (NEEDLE) ×2 IMPLANT
NS IRRIG 1000ML POUR BTL (IV SOLUTION) ×2 IMPLANT
PACK LAMINECTOMY NEURO (CUSTOM PROCEDURE TRAY) ×2 IMPLANT
PIN CASPAR 14 (PIN) ×1 IMPLANT
PIN CASPAR 14MM (PIN) ×2
PLATE SKYLINE 12MM (Plate) ×2 IMPLANT
SCREW SKYLINE 14MM SD-VA (Screw) ×6 IMPLANT
SCREW SKYLINE VAR OS 14MM (Screw) ×2 IMPLANT
SPOGE SURGIFLO 8M (HEMOSTASIS) ×1
SPONGE KITTNER 5P (MISCELLANEOUS) ×4 IMPLANT
SPONGE SURGIFLO 8M (HEMOSTASIS) ×1 IMPLANT
STAPLER SKIN PROX 35W (STAPLE) IMPLANT
SUT V-LOC 90 ABS DVC 3-0 CL (SUTURE) ×2 IMPLANT
SUT VIC AB 3-0 SH 8-18 (SUTURE) ×2 IMPLANT
SYR 30ML LL (SYRINGE) ×2 IMPLANT
TAPE CLOTH 3X10 WHT NS LF (GAUZE/BANDAGES/DRESSINGS) ×2 IMPLANT
TOWEL OR 17X26 4PK STRL BLUE (TOWEL DISPOSABLE) ×6 IMPLANT
TRAY FOLEY MTR SLVR 16FR STAT (SET/KITS/TRAYS/PACK) IMPLANT
TUBING CONNECTING 10 (TUBING) ×2 IMPLANT

## 2020-01-18 NOTE — Plan of Care (Signed)
  Problem: Health Behavior/Discharge Planning: Goal: Ability to manage health-related needs will improve Outcome: Progressing   Problem: Clinical Measurements: Goal: Ability to maintain clinical measurements within normal limits will improve Outcome: Progressing Goal: Will remain free from infection Outcome: Progressing Goal: Respiratory complications will improve Outcome: Progressing   Problem: Nutrition: Goal: Adequate nutrition will be maintained Outcome: Progressing   Problem: Coping: Goal: Level of anxiety will decrease Outcome: Progressing   Problem: Elimination: Goal: Will not experience complications related to bowel motility Outcome: Progressing Goal: Will not experience complications related to urinary retention Outcome: Progressing   Problem: Pain Managment: Goal: General experience of comfort will improve Outcome: Progressing   Problem: Safety: Goal: Ability to remain free from injury will improve Outcome: Progressing   Problem: Skin Integrity: Goal: Risk for impaired skin integrity will decrease Outcome: Progressing

## 2020-01-18 NOTE — Transfer of Care (Signed)
Immediate Anesthesia Transfer of Care Note  Patient: Alexandria Moore  Procedure(s) Performed: ANTERIOR CERVICAL DECOMPRESSION/DISCECTOMY FUSION 1 LEVEL (N/A )  Patient Location: PACU  Anesthesia Type:General  Level of Consciousness: awake, alert  and oriented  Airway & Oxygen Therapy: Patient Spontanous Breathing and Patient connected to nasal cannula oxygen  Post-op Assessment: Report given to RN and Post -op Vital signs reviewed and stable  Post vital signs: Reviewed and stable  Last Vitals:  Vitals Value Taken Time  BP    Temp    Pulse    Resp    SpO2      Last Pain:  Vitals:   01/18/20 0648  TempSrc: Temporal  PainSc: 4          Complications: No apparent anesthesia complications

## 2020-01-18 NOTE — Anesthesia Preprocedure Evaluation (Addendum)
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 The left ventricle has normal function. The left ventricle has no regional wall motion abnormalities. Left ventricular diastolic function could not be evaluated. 2. Right ventricular systolic function is normal. The right ventricular size is normal. There is normal pulmonary artery systolic pressure. 3. The mitral valve is normal in structure and function. No evidence of mitral valve regurgitation. 4. The aortic valve is grossly normal. Aortic valve regurgitation is not visualized. 5. Pulmonic valve regurgitation not assessed. 6. The inferior vena cava is normal in size with greater than 50% respiratory variability, suggesting right atrial pressure of 3 mmHg.   Neuro/Psych    GI/Hepatic GERD  Controlled,  Endo/Other  Hypothyroidism Morbid obesity  Renal/GU      Musculoskeletal  (+) Arthritis ,   Abdominal   Peds  Hematology   Anesthesia Other Findings Past Medical History: No date: Arthritis No date: CHF (congestive heart failure) (HCC) No date: Dyspnea     Comment:  not so much now since weight loss No date: GERD (gastroesophageal reflux disease) No date: Heart murmur     Comment:  not being followed or treated. has lost weight with               improvement of murmur No date: History of blood transfusion     Comment:   unsure of when she had transfusion but states she had a               terrible reaction and needed medication No date: Hypertension No date: Hypothyroidism     Comment:  not on meds at this time No date: Sleep apnea     Comment:  does not use cpap since weight loss Past Surgical History: 1982: CESAREAN SECTION     Comment:  x 1 05/12/2018: TOTAL HIP ARTHROPLASTY; Left     Comment:  Procedure: TOTAL HIP ARTHROPLASTY ANTERIOR APPROACH;                Surgeon: Hessie Knows, MD;  Location: [facility] ORS;                Service: Orthopedics;  Laterality: Left; BMI    Body Mass Index: 43.43 kg/m     Reproductive/Obstetrics                          Anesthesia Physical Anesthesia Plan  ASA: III  Anesthesia Plan: General   Post-op Pain Management:    Induction: Intravenous  PONV Risk Score and Plan: Ondansetron and Treatment may vary due to age or medical condition  Airway Management Planned: Oral ETT  Additional Equipment:   Intra-op Plan:   Post-operative Plan: Extubation in OR  Informed Consent: I have reviewed the patients History and Physical, chart, labs and discussed the procedure including the risks, benefits and alternatives for the proposed anesthesia with  the patient or authorized representative who has indicated his/her understanding and acceptance.     Dental Advisory Given  Plan Discussed with: CRNA  Anesthesia Plan Comments:        Anesthesia Quick Evaluation

## 2020-01-18 NOTE — Anesthesia Postprocedure Evaluation (Signed)
Anesthesia Post Note  Patient: BETHZY HAUCK  Procedure(s) Performed: ANTERIOR CERVICAL DECOMPRESSION/DISCECTOMY FUSION 1 LEVEL (N/A )  Patient location during evaluation: PACU Anesthesia Type: General Level of consciousness: awake and alert Pain management: pain level controlled Vital Signs Assessment: post-procedure vital signs reviewed and stable Respiratory status: spontaneous breathing, nonlabored ventilation, respiratory function stable and patient connected to nasal cannula oxygen Cardiovascular status: blood pressure returned to baseline and stable Postop Assessment: no apparent nausea or vomiting Anesthetic complications: no Comments: Pt noted "chest flutter" not pain, reminds her of "anxiety" state. EKG no sig change, feeling gradually improved to complete resolution. Will follow on ward post op to ensure remains resolved. VSS throughout, non-radiating, no sweating etc.     Last Vitals:  Vitals:   01/18/20 1030 01/18/20 1129  BP:  (!) 158/96  Pulse: 89 73  Resp: 11 13  Temp:  (!) 36.3 C  SpO2: 100% 100%    Last Pain:  Vitals:   01/18/20 1128  TempSrc:   PainSc: 4                  Bearett Porcaro Garry Heater

## 2020-01-18 NOTE — Interval H&P Note (Signed)
History and Physical Interval Note:  01/18/2020 7:14 AM  Alexandria Moore  has presented today for surgery, with the diagnosis of cervical myelopathy g95.9.  The various methods of treatment have been discussed with the patient and family. After consideration of risks, benefits and other options for treatment, the patient has consented to  Procedure(s): ANTERIOR CERVICAL DECOMPRESSION/DISCECTOMY FUSION 1 LEVEL (N/A) as a surgical intervention.  The patient's history has been reviewed, patient examined, no change in status, stable for surgery.  I have reviewed the patient's chart and labs.  Questions were answered to the patient's satisfaction.     Fleetwood Pierron

## 2020-01-18 NOTE — Interval H&P Note (Signed)
History and Physical Interval Note:  01/18/2020 7:15 AM  Alexandria Moore  has presented today for surgery, with the diagnosis of cervical myelopathy g95.9.  The various methods of treatment have been discussed with the patient and family. After consideration of risks, benefits and other options for treatment, the patient has consented to  Procedure(s): ANTERIOR CERVICAL DECOMPRESSION/DISCECTOMY FUSION 1 LEVEL (N/A) as a surgical intervention.  The patient's history has been reviewed, patient examined, no change in status, stable for surgery.  I have reviewed the patient's chart and labs.  Questions were answered to the patient's satisfaction.     [name]

## 2020-01-18 NOTE — Progress Notes (Signed)
PT Cancellation Note  Patient Details Name: Alexandria Moore MRN: [id number] DOB: [birth year]   Cancelled Treatment:    Reason Eval/Treat Not Completed: Patient at procedure or test/unavailable(Consult received and chart reviewed.  Pt unavailable 2/2 procedure this morning (C4-C5 anterior cervical discectomy).  Per guidelines, will require new orders to initiate post-op.  Please re-consult as medically appropriate.)   Huriel Matt H. Manson Passey, PT, DPT, NCS 01/18/20, 8:59 AM [phone]

## 2020-01-18 NOTE — Anesthesia Procedure Notes (Signed)
Procedure Name: Intubation Date/Time: 01/18/2020 7:48 AM Performed by: Clyde Lundborg, CRNA Pre-anesthesia Checklist: Patient identified, Emergency Drugs available, Suction available and Patient being monitored Patient Re-evaluated:Patient Re-evaluated prior to induction Oxygen Delivery Method: Circle system utilized Preoxygenation: Pre-oxygenation with 100% oxygen Induction Type: IV induction Ventilation: Mask ventilation without difficulty Laryngoscope Size: McGraph and 3 Grade View: Grade I Tube type: Oral Tube size: 7.0 mm Number of attempts: 1 Airway Equipment and Method: Stylet and Video-laryngoscopy Placement Confirmation: ETT inserted through vocal cords under direct vision,  positive ETCO2,  breath sounds checked- equal and bilateral and CO2 detector Secured at: 21 cm Tube secured with: Tape Dental Injury: Teeth and Oropharynx as per pre-operative assessment

## 2020-01-18 NOTE — Op Note (Signed)
Indications: Ms. Eroh is a 59 yo female who presented with progressive cervical myelopathy with severe weakness.  Due to progression, surgery was recommended.  Findings: severe stenosis   Preoperative Diagnosis: Cervical myelopathy Postoperative Diagnosis: same   EBL: 25 ml IVF: 700 ml Drains: none Disposition: Extubated and Stable to PACU Complications: none  No foley catheter was placed.   Preoperative Note:   Risks of surgery discussed include: infection, bleeding, stroke, coma, death, paralysis, CSF leak, nerve/spinal cord injury, numbness, tingling, weakness, complex regional pain syndrome, recurrent stenosis and/or disc herniation, vascular injury, development of instability, neck/back pain, need for further surgery, persistent symptoms, development of deformity, and the risks of anesthesia. The patient understood these risks and agreed to proceed.  Procedure:  1) Anterior cervical diskectomy and fusion at C4-5 2) Anterior cervical instrumentation at C4-5 using Synthes Skyline 3) Structural allograft consisting of corticocancellous allograft   Procedure: After obtaining informed consent, the patient taken to the operating room, placed in supine position, general anesthesia induced.  The patient had a small shoulder roll placed behind their shoulders.  The patient received preop antibiotics and IV Decadron.  The patient had a neck incision outlined, was prepped and draped in usual sterile fashion. The incision was injected with local anesthetic.   An incision was opened, dissection taken down medial to the carotid artery and jugular vein, lateral to the trachea and esophagus.  The prevertebral fascia identified and a localizing x-ray demonstrated the correct level.  The longus colli were dissected laterally, and self-retaining retractors placed to open the operative field. The microscope was then brought into the field.  With this complete, distractor pins were placed in the  vertebral bodies of C4 and C5. The distractor was placed, and the anulus at C4/5 was opened using a bovie.  Curettes and pituitary rongeurs used to remove the majority of disk, then the drill was used to remove the posterior osteophyte and begin the foraminotomies. The nerve hook was used to elevate the posterior longitudinal ligament, which was then removed with Kerrison rongeurs. The microblunt nerve hook could be passed out the foramen bilaterally.   Meticulous hemostasis obtained.  Structural allograft was tapped behind the anterior lip of the vertebral body at C4/5 (7 mm).    The caspar distractor was removed, and bone wax used for hemostasis. A separate, 12 mm Synthes Skyline plate was chosen.  Two screws placed in each vertebral body, respectively making sure the screws were behind the locking mechanism.  Final AP and lateral radiographs were taken.   With everything in good position, the wound was irrigated copiously with bacitracin-containing solution and meticulous hemostasis obtained.  Wound was closed in 2 layers using interrupted inverted 3-0 Vicryl sutures.  The wound was dressed with dermabond, the head of bed at 30 degrees, taken to recovery room in stable condition.  No new postop neurological deficits were identified.  Sponge and pattie counts were correct at the end of the procedure.    I performed the entire procedure with Ivar Drape PA as an Designer, television/film set.  Venetia Night MD

## 2020-01-18 NOTE — Progress Notes (Signed)
OT Cancellation Note  Patient Details Name: CONNEE IKNER MRN: [id number] DOB: 05-04-1961   Cancelled Treatment:    Reason Eval/Treat Not Completed: Patient at procedure or test/ unavailable   OT consult received.  Pt unavailable 2/2 procedure this morning (C4-C5 anterior cervical discectomy).  Will attempt evaluation when pt is medically stable and appropriate for therapy.    Kathyrn Drown Eufemio Strahm, OTR/L 01/18/20, 8:36 AM

## 2020-01-19 ENCOUNTER — Inpatient Hospital Stay: Payer: 59

## 2020-01-19 MED ORDER — MUPIROCIN 2 % EX OINT
1.0000 "application " | TOPICAL_OINTMENT | Freq: Two times a day (BID) | CUTANEOUS | Status: DC
  Administered 2020-01-19 – 2020-01-20 (×3): 1 via NASAL
  Filled 2020-01-19: qty 22

## 2020-01-19 MED ORDER — ACETAMINOPHEN 325 MG PO TABS: 650.0000 mg | ORAL_TABLET | Freq: Four times a day (QID) | ORAL | Status: DC | PRN

## 2020-01-19 NOTE — Progress Notes (Signed)
Procedure: C4-5 ACDF Procedure date: 01/18/2020 Diagnosis: Cervical myelopathy   History: Alexandria Moore is s/p C4-5 ACDF for cervical myelopathy  POD1: She is recovering well.  Feels that she can move her upper extremity much more than she had been able to prior to surgery.  Complains of excessive mucus but she is able to cough it up and throat discomfort.  Pain currently rated 4-5/10.  Denies any new upper or lower extremity pain/numbness/tingling/weakness.  Eating without issue, voiding, and walking to the bathroom without issue.  She is tolerating the tramadol well.  Physical Exam: Vitals:   01/19/20 0435 01/19/20 0745  BP: 129/76 133/85  Pulse: 75 67  Resp: 16 16  Temp: 98.5 F (36.9 C) (!) 97.5 F (36.4 C)  SpO2: 100% 96%    General: Alert and oriented, sitting up in hospital bed eating breakfast. Strength: 4+/5 right grip, and tricep.  4/5 right bicep.  2/5 right deltoid Skin: Glue intact at incision site  Data:  Recent Labs  Lab 01/17/20 1816  NA 141  K 4.0  CL 109  CO2 26  BUN 20  CREATININE 0.65  GLUCOSE 103*  CALCIUM 9.5   No results for input(s): AST, ALT, ALKPHOS in the last 168 hours.  Invalid input(s): TBILI   Recent Labs  Lab 01/17/20 1816  WBC 9.0  HGB 13.1  HCT 39.9  PLT 291   No results for input(s): APTT, INR in the last 168 hours.       Other tests/results: Cervical x-rays pending  Assessment/Plan:  Alexandria Moore is POD1 s/p C4-5. Will continue to monitor  - mobilize - pain control - DVT prophylaxis - PTOT - Imaging   Ivar Drape PA-C Department of Neurosurgery

## 2020-01-19 NOTE — TOC Initial Note (Signed)
Transition of Care [facility]) - Initial/Assessment Note    Patient Details  Name: Alexandria Moore MRN: [id number] Date of Birth: 1960-12-23  Transition of Care [facility]) CM/SW Contact:    Elease Hashimoto, LCSW Phone Number: 01/19/2020, 10:53 AM  Clinical Narrative:  Met with pt who lives with her fiance and he can assist her if needed. She is doing well and has rw and bsc from previous hip surgery. She has a daughter who is involved and supportive. Pt is doing well and walking better than she thought she would day after surgery. She has no preference regarding [place] agencies. [facility] can take the referral. Will continue to work on discharge needs.                 Expected Discharge Plan: [place] Barriers to Discharge: Continued Medical Work up   Patient Goals and CMS Choice Patient states their goals for this hospitalization and ongoing recovery are:: I hope to do well with this therapy and go home tomorrow   Choice offered to / list presented to : Patient  Expected Discharge Plan and Services Expected Discharge Plan: [place] In-house Referral: Clinical Social Work   Post Acute Care Choice: [place] arrangements for the past 2 months: [place]: PT [place]: [place] ([place]) Date [place]: 01/19/20 Time HH Agency Contacted: 89 Representative spoke with at [place]: Corene Cornea  Prior Living Arrangements/Services Living arrangements for the past 2 months: [place] with:: Significant Other   Do you feel safe going back to the place where you live?: Yes      Need for Family Participation in Patient Care: Yes (Comment) Care giver support system in place?: Yes (comment) Current home services: DME(has rw and bsc) Criminal Activity/Legal Involvement Pertinent to Current Situation/Hospitalization: No - Comment as needed  Activities of Daily  Living Home Assistive Devices/Equipment: Oxygen, Walker (specify type) ADL Screening (condition at time of admission) Patient's cognitive ability adequate to safely complete daily activities?: Yes Is the patient deaf or have difficulty hearing?: No Does the patient have difficulty seeing, even when wearing glasses/contacts?: No Does the patient have difficulty concentrating, remembering, or making decisions?: No Patient able to express need for assistance with ADLs?: Yes Does the patient have difficulty dressing or bathing?: No Independently performs ADLs?: Yes (appropriate for developmental age) Does the patient have difficulty walking or climbing stairs?: Yes Weakness of Legs: Right Weakness of Arms/Hands: Right  Permission Sought/Granted   Permission granted to share information with : Yes, Verbal Permission Granted  Share Information with NAME: Camisha  Permission granted to share info w AGENCY: [place] granted to share info w Relationship: daughter     Emotional Assessment Appearance:: Appears stated age Attitude/Demeanor/Rapport: Self-Confident, Gracious Affect (typically observed): Adaptable, Accepting Orientation: : Oriented to Self, Oriented to Place, Oriented to  Time, Oriented to Situation   Psych Involvement: No (comment)  Admission diagnosis:  Cervical myelopathy (HCC) [G95.9] Cervical stenosis of spine [M48.02] Right arm weakness [R29.898] Patient Active Problem List   Diagnosis Date Noted  . Cervical myelopathy ([place]) 01/17/2020  . Essential hypertension 12/05/2019  . Pneumonia due to COVID-19 virus 12/05/2019  . Acute respiratory failure with hypoxia ([place]) 12/05/2019  . Congestive heart failure ([place]) 12/05/2019  .  Thrombocytopenia ([place]) 12/05/2019  . Elevated troponin level 12/05/2019  . Primary osteoarthritis of both knees 04/25/2019  . Primary osteoarthritis involving multiple joints 04/25/2019  . Bilateral edema of lower extremity 02/15/2019  .  Status post total hip replacement, left 05/12/2018  . BMI 40.0-44.9, adult ([place]) 04/20/2018   PCP:  Laneta Simmers, NP Pharmacy:   [facility] [address] ([address]), [place]) [place] [address] Phone: [phone] Fax: [place], [address] [place] [address] Phone: [phone] Fax: [phone]     Social Determinants of Health (SDOH) Interventions    Readmission Risk Interventions No flowsheet data found.

## 2020-01-19 NOTE — Plan of Care (Signed)

## 2020-01-19 NOTE — TOC Progression Note (Signed)
Transition of Care [facility]) - Progression Note    Patient Details  Name: ZANAYAH SHADOWENS MRN: [id number] Date of Birth: 18-Nov-1960  Transition of Care [facility]) CM/SW Contact  Vester Titsworth, Lemar Livings, LCSW Phone Number: 01/19/2020, 1:33 PM  Clinical Narrative:  [facility] can not provide home health at home. Will look into other [facility] agencies to provide PT services at home. [facility] declined, [facility] declined, [facility] declined, [facility] declined, [facility] declined, [facility] declined. Pt may need to go to OP if has transportation    Expected Discharge Plan: Home w Home Health Services Barriers to Discharge: Continued Medical Work up  Expected Discharge Plan and Services Expected Discharge Plan: Home w Home Health Services In-house Referral: Clinical Social Work   Post Acute Care Choice: Home Health Living arrangements for the past 2 months: Single Family Home                           HH Arranged: [place] [facility]: [facility] Date HH Agency Contacted: 01/19/20 Time HH Agency Contacted: 1052 Representative spoke with at [facility] Agency: Barbara Cower   Social Determinants of Health (SDOH) Interventions    Readmission Risk Interventions No flowsheet data found.

## 2020-01-19 NOTE — Evaluation (Signed)
Occupational Therapy Evaluation Patient Details Name: Alexandria Moore MRN: [id number] DOB: 07-08-1961 Today's Date: 01/19/2020    History of Present Illness Pt is a 59 yo female s/p anterior cervical decompression/discectomy and fusion (1 level). PMH of HTN, CHF, SOB, former smoker, PRN use of O2, GERD,  L THA.   Clinical Impression   [name] was seen for OT evaluation this date. Prior to hospital admission, pt was MOD I c ADLs using AD as needed. Pt lives c fiancee who is available PRN. Pt presents to acute OT demonstrating impaired ADL performance and functional mobility 2/2 functional balance/strength/ROM deficits, decreased functional use of dominant RUE for ADL tasks, and paresthesias in RUE. Pt currently requires SETUP for self-feeding seated in bed using non dominant L hand and SUP for toothbrushing standing at sink. Pt instructed in RUE HEP for AAROM exercises to restore functional use of dominant hand. Pt would benefit from skilled OT to address noted impairments and functional limitations (see below for any additional details) in order to maximize safety and independence while minimizing falls risk and caregiver burden. Upon hospital discharge, recommend HHOT to maximize pt safety and return to functional independence during meaningful occupations of daily life.    Follow Up Recommendations  Home health OT;Supervision - Intermittent    Equipment Recommendations  None recommended by OT    Recommendations for Other Services       Precautions / Restrictions Precautions Precautions: Fall;Cervical Precaution Booklet Issued: No Restrictions Weight Bearing Restrictions: No      Mobility Bed Mobility Overal bed mobility: Modified Independent             General bed mobility comments: HOB elevated sup>sit   Transfers Overall transfer level: Modified independent Equipment used: Rolling walker (2 wheeled)             General transfer comment: sit<>stand at EOB c  increased time    Balance Overall balance assessment: Needs assistance Sitting-balance support: Feet supported Sitting balance-Leahy Scale: Normal     Standing balance support: Single extremity supported;During functional activity Standing balance-Leahy Scale: Good Standing balance comment: improved safety noted with RW for ambulation                           ADL either performed or assessed with clinical judgement   ADL Overall ADL's : Needs assistance/impaired                                       General ADL Comments: SETUP + increased time for self-feeding seated in bed using nondominant L hand (R hand assists for holding to open packages). SETUP + SUP tooth brushing standing sinkside using L nondominant hand.      Vision         Perception     Praxis      Pertinent Vitals/Pain Pain Assessment: Faces Faces Pain Scale: Hurts a little bit Pain Location: c RUE movement Pain Descriptors / Indicators: Aching;Grimacing Pain Intervention(s): Limited activity within patient's tolerance;Repositioned     Hand Dominance Right   Extremity/Trunk Assessment Upper Extremity Assessment Upper Extremity Assessment: RUE deficits/detail RUE Deficits / Details: Shoulder flexion: AROM ~30*, PROM ~80*, AAROM seated ~95*, AAROM standing ~120*. Grip 3+/5. 5 finger opposition intact c increased time. Limited active pronation. Endorses numbness/tingling t/o C5 distribution of R arm and digits 1-3.   RUE Sensation: WNL  RUE Coordination: decreased fine motor;decreased gross motor LUE Deficits / Details: WFL grossly, grip 4/5. 5 finger opposition intact.  LUE Sensation: WNL LUE Coordination: WNL   Lower Extremity Assessment Lower Extremity Assessment: Overall WFL for tasks assessed RLE Deficits / Details: grossly 4-/5 LLE Deficits / Details: grossly 4+/5   Cervical / Trunk Assessment Cervical / Trunk Assessment: Normal(anterior incision noted with some  edema)   Communication Communication Communication: No difficulties   Cognition Arousal/Alertness: Awake/alert Behavior During Therapy: WFL for tasks assessed/performed Overall Cognitive Status: Within Functional Limits for tasks assessed                                     General Comments  SpO2 stable t/o    Exercises Exercises: Other exercises Other Exercises Other Exercises: Pt educated re: falls prevention, energy conservation, HEP, pain management, PLB Other Exercises: bed mobility, sup>sit, sit<>stand, self-feeding, tooth brushing standing sinkside, AAROM HEP (shoulder flexion and pronation),    Shoulder Instructions      Home Living Family/patient expects to be discharged to:: Private residence Living Arrangements: Spouse/significant other(Fiancee) Available Help at Discharge: Family;Available PRN/intermittently(Fiancee works 8hrs/day out of home) Type of Home: Mobile home Home Access: Stairs to enter [employer] of Steps: 3-4 Entrance Stairs-Rails: Right;Left;Can reach both Home Layout: One level     Bathroom Shower/Tub: Occupational psychologist: Standard Bathroom Accessibility: Yes How Accessible: Accessible via walker Home Equipment: [place] - 2 wheels;Cane - single point;Hand held shower head          Prior Functioning/Environment Level of Independence: Independent with assistive device(s)(RW and O2 PRN at baseline)                 OT Problem List: Decreased strength;Decreased range of motion;Impaired balance (sitting and/or standing);Decreased coordination;Decreased safety awareness;Decreased knowledge of use of DME or AE;Impaired UE functional use      OT Treatment/Interventions: Self-care/ADL training;Therapeutic exercise;Neuromuscular education;Energy conservation;DME and/or AE instruction;Therapeutic activities;Patient/family education;Balance training    OT Goals(Current goals can be found in the care plan  section) Acute Rehab OT Goals Patient Stated Goal: to get her strength back OT Goal Formulation: With patient Time For Goal Achievement: 02/02/20 Potential to Achieve Goals: Good ADL Goals Pt Will Perform Eating: with modified independence;sitting(c no cues for bimanual integration) Pt Will Perform Lower Body Dressing: with set-up;sit to/from stand(c LRAD & AE PRN) Pt Will Transfer to Toilet: with modified independence;ambulating;regular height toilet(c LRAD PRN) Pt/caregiver will Perform Home Exercise Program: Increased ROM;Increased strength;Right Upper extremity  OT Frequency: Min 1X/week   Barriers to D/C: Inaccessible home environment;Decreased caregiver support          Co-evaluation              AM-PAC OT "6 Clicks" Daily Activity     Outcome Measure Help from another person eating meals?: A Little Help from another person taking care of personal grooming?: A Little Help from another person toileting, which includes using toliet, bedpan, or urinal?: A Little Help from another person bathing (including washing, rinsing, drying)?: A Lot Help from another person to put on and taking off regular upper body clothing?: A Little Help from another person to put on and taking off regular lower body clothing?: A Little 6 Click Score: 17   End of Session    Activity Tolerance: Patient tolerated treatment well Patient left: in bed;Other (comment)(PT in room at end of session)  OT Visit Diagnosis: Unsteadiness on feet (R26.81);Other abnormalities of gait and mobility (R26.89)                Time: 4742-5956 OT Time Calculation (min): 26 min Charges:  OT General Charges $OT Visit: 1 Visit OT Evaluation $OT Eval Moderate Complexity: 1 Mod OT Treatments $Self Care/Home Management : 8-22 mins  Kathie Dike, M.S. OTR/L  01/19/20, 11:07 AM

## 2020-01-19 NOTE — Evaluation (Signed)
Physical Therapy Evaluation Patient Details Name: Alexandria Moore MRN: [id number] DOB: August 21, 1961 Today's Date: 01/19/2020   History of Present Illness  Pt is a 59 yo female s/p anterior cervical decompression/discectomy and fusion (1 level). PMH of HTN, CHF, SOB, former smoker, PRN use of O2, GERD,  L THA.    Clinical Impression  Pt alert, up in bathroom with OT upon PT entering room. Pt did exhibit mild pain signs/symptoms with cervical motions. At baseline pt is independent but has used AD in the past as needed, fiance will be able to assist PRN at discharge.  The patient demonstrated sit <> stand transfer with and without RW, at least unilateral support utilized. Ambulated ~234ft with RW and supervision/modI. No LOB or unsteadiness noted. Pt is able to ambulate without RW but exhibited improved gait velocity and steadiness with RW, encouraged to use AD for now. Upon assessment pt reported light touch sensation of UEs and LEs WNLs. RUE and RLE weaker than LUE and LLE, pt educated and encouraged to perform exercises as able.  Overall the patient demonstrated deficits (see "PT Problem List") that impede the patient's functional abilities, safety, and mobility and would benefit from skilled PT intervention. Recommendation is HHPT with intermittent supervision to return pt to PLOF.     Follow Up Recommendations Home health PT;Supervision - Intermittent    Equipment Recommendations  None recommended by PT(Pt has RW at home)    Recommendations for Other Services       Precautions / Restrictions Precautions Precautions: Fall;Cervical Precaution Booklet Issued: No Restrictions Weight Bearing Restrictions: No      Mobility  Bed Mobility               General bed mobility comments: Pt up with OT at start of session  Transfers Overall transfer level: Modified independent Equipment used: Rolling walker (2 wheeled)                Ambulation/Gait Ambulation/Gait  assistance: Modified independent (Device/Increase time) Gait Distance (Feet): 200 Feet Assistive device: Rolling walker (2 wheeled)       General Gait Details: Pt able to ambulate with RW, but exhibited improved steadiness and gait velocity with AD  Stairs            Wheelchair Mobility    Modified Rankin (Stroke Patients Only)       Balance Overall balance assessment: Needs assistance Sitting-balance support: Feet supported Sitting balance-Leahy Scale: Normal       Standing balance-Leahy Scale: Good Standing balance comment: improved safety noted with RW for ambulation                             Pertinent Vitals/Pain Pain Assessment: Faces Faces Pain Scale: Hurts a little bit Pain Location: with cervical motion Pain Intervention(s): Limited activity within patient's tolerance;Monitored during session;Repositioned    Home Living Family/patient expects to be discharged to:: Private residence Living Arrangements: Spouse/significant other Available Help at Discharge: Family;Available PRN/intermittently Type of Home: Mobile home Home Access: Stairs to enter Entrance Stairs-Rails: Right;Left;Can reach both Entrance Stairs-Number of Steps: 3-4 Home Layout: One level Home Equipment: Walker - 2 wheels;Cane - single point      Prior Function Level of Independence: Independent               Hand Dominance   Dominant Hand: Right    Extremity/Trunk Assessment   Upper Extremity Assessment Upper Extremity Assessment: RUE deficits/detail;LUE deficits/detail;Defer to OT  evaluation RUE Deficits / Details: unable to flex R shoulder past 30-40degs, AAROM/PROM full motion, unable to hold against gravity when elevated/ elbow flex/extend 3+/5, grip weaker than L RUE Sensation: WNL RUE Coordination: decreased fine motor;decreased gross motor LUE Deficits / Details: WFLs LUE Sensation: WNL LUE Coordination: WNL    Lower Extremity Assessment Lower  Extremity Assessment: RLE deficits/detail;LLE deficits/detail RLE Deficits / Details: grossly 4-/5 LLE Deficits / Details: grossly 4+/5    Cervical / Trunk Assessment Cervical / Trunk Assessment: Normal(anterior incision noted with some edema)  Communication   Communication: No difficulties  Cognition Arousal/Alertness: Awake/alert Behavior During Therapy: WFL for tasks assessed/performed Overall Cognitive Status: Within Functional Limits for tasks assessed                                        General Comments      Exercises Other Exercises Other Exercises: Pt educated on UE and LE exercises for R side, to help maintain/improve current strength   Assessment/Plan    PT Assessment Patient needs continued PT services  PT Problem List Decreased mobility;Decreased range of motion;Decreased activity tolerance;Decreased strength;Decreased balance;Pain       PT Treatment Interventions DME instruction;Therapeutic exercise;Gait training;Balance training;Neuromuscular re-education;Stair training;Functional mobility training;Therapeutic activities;Patient/family education    PT Goals (Current goals can be found in the Care Plan section)  Acute Rehab PT Goals Patient Stated Goal: to get her strength back PT Goal Formulation: With patient Time For Goal Achievement: 02/02/20 Potential to Achieve Goals: Good    Frequency 7X/week   Barriers to discharge        Co-evaluation               AM-PAC PT "6 Clicks" Mobility  Outcome Measure Help needed turning from your back to your side while in a flat bed without using bedrails?: None Help needed moving from lying on your back to sitting on the side of a flat bed without using bedrails?: None Help needed moving to and from a bed to a chair (including a wheelchair)?: None Help needed standing up from a chair using your arms (e.g., wheelchair or bedside chair)?: None Help needed to walk in hospital room?:  None Help needed climbing 3-5 steps with a railing? : A Little 6 Click Score: 23    End of Session Equipment Utilized During Treatment: Gait belt Activity Tolerance: Patient tolerated treatment well Patient left: in chair;with call bell/phone within reach;with SCD's reapplied Nurse Communication: Mobility status PT Visit Diagnosis: Other abnormalities of gait and mobility (R26.89);Pain Pain - Right/Left: (midline) Pain - part of body: (cervical)    Time: 4098-1191 PT Time Calculation (min) (ACUTE ONLY): 19 min   Charges:   PT Evaluation $PT Eval Low Complexity: 1 Low PT Treatments $Therapeutic Exercise: 8-22 mins        Olga Coaster PT, DPT 10:21 AM,01/19/20

## 2020-01-20 MED ORDER — TRAMADOL HCL 50 MG PO TABS: 50.0000 mg | ORAL_TABLET | Freq: Four times a day (QID) | ORAL | 0 refills | Status: DC | PRN

## 2020-01-20 MED ORDER — METHOCARBAMOL 500 MG PO TABS: 500.0000 mg | ORAL_TABLET | Freq: Four times a day (QID) | ORAL | 0 refills | Status: DC

## 2020-01-20 MED ORDER — MENTHOL 3 MG MT LOZG: 1.0000 | LOZENGE | OROMUCOSAL | 12 refills | Status: DC | PRN

## 2020-01-20 NOTE — Discharge Summary (Addendum)
  Procedure: C4-5 ACDF Procedure date: 01/18/2020 Diagnosis: Cervical myelopathy   History: Alexandria Moore is s/p C4-5 ACDF for cervical myelopathy  POD2: Continues to do well. Pain currently rated 3/10 - posterior cervical spine. Complains of nasal drainage into the back of throat and swelling at incision site. Able to tolerate liquids and soft food without issue - lozenges help. Right upper extremity strength continue to improve. Aching pain in right fingertips has changed to tingling. Ambulated and voiding without issue.    POD1: She is recovering well.  Feels that she can move her upper extremity much more than she had been able to prior to surgery.  Complains of excessive mucus but she is able to cough it up and throat discomfort.  Pain currently rated 4-5/10.  Denies any new upper or lower extremity pain/numbness/tingling/weakness.  Eating without issue, voiding, and walking to the bathroom without issue.  She is tolerating the tramadol well.  Physical Exam: Vitals:   01/19/20 2349 01/20/20 0850  BP: 118/70 138/81  Pulse: 79 70  Resp: 16   Temp: 97.6 F (36.4 C) 97.7 F (36.5 C)  SpO2: 97% 98%    General: Alert and oriented, sitting up in hospital bed eating breakfast. Strength: 4+/5 right grip, 5/5  tricep.  4+/5 right bicep.  2/5 right deltoid Skin: Glue intact at incision site. Swelling and tendernes noted at incision site. No trachea deviation, warmth, drainage, bleeding.   Data:  Recent Labs  Lab 01/17/20 1816  NA 141  K 4.0  CL 109  CO2 26  BUN 20  CREATININE 0.65  GLUCOSE 103*  CALCIUM 9.5   No results for input(s): AST, ALT, ALKPHOS in the last 168 hours.  Invalid input(s): TBILI   Recent Labs  Lab 01/17/20 1816  WBC 9.0  HGB 13.1  HCT 39.9  PLT 291   No results for input(s): APTT, INR in the last 168 hours.       Other tests/results:  EXAM: CERVICAL SPINE - 2-3 VIEW 01/19/2020  COMPARISON:  01/17/2020  FINDINGS: Rather prominent  prevertebral soft tissue swelling, not out of the range of white can be expected. ACDF C4-5 with interbody spacer, anterior plate and screws. Chronic disc space narrowing C3-4 and C5-6.  IMPRESSION: ACDF C4-5. Rather prominent prevertebral soft tissue swelling, but not out of the range of what can be expected in the immediate postoperative time frame.  Assessment/Plan:  Alexandria Moore is POD2 s/p C4-5 for cervical myelopathy. Ambulating, voiding, and eating soft diet without issue. Advised to position herself more upright to help with swelling and maintain soft diet with plenty of liquids. If swelling worsens, she may continue prednisone taper (previously prescribed - one day completed from pack). Seek emergency medical attention if unable to tolerate liquids, soft foods, or experiences difficulty breathing/talking. WIll continue post op pain control with tylenol, muscle relaxer, and tramadol as needed. She is scheduled to follow up in clinic in approximately 2 weeks to monitor progress. Advised to contact office if any questions or concerns arise before then.   Ivar Drape PA-C Department of Neurosurgery

## 2020-01-20 NOTE — Progress Notes (Signed)
Physical Therapy Treatment Patient Details Name: Alexandria Moore MRN: [id number] DOB: Mar 07, 1961 Today's Date: 01/20/2020    History of Present Illness Pt is a 59 yo female s/p anterior cervical decompression/discectomy and fusion (1 level). PMH of HTN, CHF, SOB, former smoker, PRN use of O2, GERD,  L THA.    PT Comments    Pt was long sitting in bed upon arriving. She agrees to PT session and is cooperative throughout. Able to exit L side of bed (flat bed position) without assistance. Stood and ambulated with RW without assistance. Pt then demonstrated safe ability to ascend/descend 4 stair with BUE support on rails without difficulty. Pt is cleared from PT standpoint for safe DC home. Recommend HHPT to continue to progress pt as able.    Follow Up Recommendations  Home health PT;Supervision - Intermittent     Equipment Recommendations  None recommended by PT    Recommendations for Other Services       Precautions / Restrictions Precautions Precautions: Fall;Cervical Precaution Booklet Issued: No Restrictions Weight Bearing Restrictions: No    Mobility  Bed Mobility Overal bed mobility: Modified Independent                Transfers Overall transfer level: Modified independent Equipment used: Rolling walker (2 wheeled)                Ambulation/Gait Ambulation/Gait assistance: Modified independent (Device/Increase time) Gait Distance (Feet): 200 Feet Assistive device: Rolling walker (2 wheeled) Gait Pattern/deviations: WFL(Within Functional Limits)         Stairs Stairs: Yes Stairs assistance: Modified independent (Device/Increase time) Stair Management: Two rails Number of Stairs: 4 General stair comments: pt demonstrated safe ability to perform ascending/descending stairs without difficulty   Wheelchair Mobility    Modified Rankin (Stroke Patients Only)       Balance                                             Cognition Arousal/Alertness: Awake/alert Behavior During Therapy: WFL for tasks assessed/performed Overall Cognitive Status: Within Functional Limits for tasks assessed                                 General Comments: pt is A and O x 4      Exercises      General Comments        Pertinent Vitals/Pain Pain Assessment: No/denies pain Faces Pain Scale: Hurts a little bit Pain Descriptors / Indicators: Aching;Grimacing Pain Intervention(s): Limited activity within patient's tolerance    Home Living                      Prior Function            PT Goals (current goals can now be found in the care plan section) Acute Rehab PT Goals Patient Stated Goal: to go home Progress towards PT goals: Progressing toward goals    Frequency    7X/week      PT Plan Current plan remains appropriate    Co-evaluation              AM-PAC PT "6 Clicks" Mobility   Outcome Measure  Help needed turning from your back to your side while in a flat bed without using bedrails?: None Help needed  moving from lying on your back to sitting on the side of a flat bed without using bedrails?: None Help needed moving to and from a bed to a chair (including a wheelchair)?: None Help needed standing up from a chair using your arms (e.g., wheelchair or bedside chair)?: None Help needed to walk in hospital room?: None Help needed climbing 3-5 steps with a railing? : None 6 Click Score: 24    End of Session Equipment Utilized During Treatment: Gait belt Activity Tolerance: Patient tolerated treatment well Patient left: in chair;with call bell/phone within reach;with SCD's reapplied Nurse Communication: Mobility status PT Visit Diagnosis: Other abnormalities of gait and mobility (R26.89);Pain     Time: 1829-9371 PT Time Calculation (min) (ACUTE ONLY): 9 min  Charges:  $Gait Training: 8-22 mins                    Julaine Fusi PTA 01/20/20, 1:01 PM

## 2020-01-20 NOTE — Discharge Instructions (Signed)
°Your surgeon has performed an operation on your cervical spine (neck) to relieve pressure on the spinal cord and/or nerves. This involved making an incision in the front of your neck and removing one or more of the discs that support your spine. Next, a small piece of bone, a titanium plate, and screws were used to fuse two or more of the vertebrae (bones) together. ° °The following are instructions to help in your recovery once you have been discharged from the hospital. Even if you feel well, it is important that you follow these activity guidelines. If you do not let your neck heal properly from the surgery, you can increase the chance of return of your symptoms and other complications. ° °* Do not take anti-inflammatory medications for 3 months after surgery (naproxen [Aleve], ibuprofen [Advil, Motrin], etc.). These medications can prevent your bones from healing properly. ° °Activity  °  °No bending, lifting, or twisting (“BLT”). Avoid lifting objects heavier than 10 pounds (gallon milk jug).  Where possible, avoid household activities that involve lifting, bending, reaching, pushing, or pulling such as laundry, vacuuming, grocery shopping, and childcare. Try to arrange for help from friends and family for these activities while your back heals. ° °Increase physical activity slowly as tolerated.  Taking short walks is encouraged, but avoid strenuous exercise. Do not jog, run, bicycle, lift weights, or participate in any other exercises unless specifically allowed by your doctor. ° °Talk to your doctor before resuming sexual activity. ° °You should not drive until cleared by your doctor. ° °Until released by your doctor, you should not return to work or school.  You should rest at home and let your body heal.  ° °You may shower three days after your surgery.  After showering, lightly dab your incision dry. Do not take a tub bath or go swimming until approved by your doctor at your follow-up appointment. ° °If  your doctor ordered a cervical collar (neck brace) for you, you should wear it whenever you are out of bed. You may remove it when lying down or sleeping, but you should wear it at all other times. Not all neck surgeries require a cervical collar. ° °If you smoke, we strongly recommend that you quit.  Smoking has been proven to interfere with normal bone healing and will dramatically reduce the success rate of your surgery. Please contact [facility] (800-QUIT-NOW) and use the resources at www.[facility].com for assistance in stopping smoking. ° °Surgical Incision °  °If you have a dressing on your incision, you may remove it two days after your surgery. Keep your incision area clean and dry. ° °If you have staples or stitches on your incision, you should have a follow up scheduled for removal. If you do not have staples or stitches, you will have steri-strips (small pieces of surgical tape) or Dermabond glue. The steri-strips/glue should begin to peel away within about a week (it is fine if the steri-strips fall off before then). If the strips are still in place one week after your surgery, you may gently remove them. ° °Diet          ° °You may return to your usual diet. However, you may experience discomfort when swallowing in the first month after your surgery. This is normal. You may find that softer foods are more comfortable for you to swallow. Be sure to stay hydrated. ° °When to Contact Us ° °You may experience pain in your neck and/or pain between your shoulder blades. This   is normal and should improve in the next few weeks with the help of pain medication, muscle relaxers, and rest. Some patients report that a warm compress on the back of the neck or between the shoulder blades helps. ° °However, should you experience any of the following, contact us immediately: °• New numbness or weakness °• Pain that is progressively getting worse, and is not relieved by your pain medication, muscle relaxers, rest, and  warm compresses °• Bleeding, redness, swelling, pain, or drainage from surgical incision °• Chills or flu-like symptoms °• Fever greater than 101.0 F (38.3 C) °• Inability to eat, drink fluids, or take medications °• Problems with bowel or bladder functions °• Difficulty breathing or shortness of breath °• Warmth, tenderness, or swelling in your calf °Contact Information °• During office hours (Monday-Friday 9 am to 5 pm), please call your physician at [phone] and ask for Kendelyn Jean °• After hours and weekends, please call [phone] and an answering service will put you in touch with either Dr. Cook or Dr. Yarbrough.  °• For a life-threatening emergency, call 911 ° ° °

## 2020-01-20 NOTE — Progress Notes (Signed)
Pt is being discharged home.  Discharge papers given and explained to pt.  Pt verbalized understanding.  Meds and f/u appointments reviewed.  Rx given.  Awaiting transportation. 

## 2020-01-21 LAB — TYPE AND SCREEN
ABO/RH(D): A POS
Antibody Screen: POSITIVE
Unit division: 0
Unit division: 0

## 2020-01-21 LAB — BPAM RBC
Blood Product Expiration Date: 202105022359
Blood Product Expiration Date: 202105022359
Unit Type and Rh: 6200
Unit Type and Rh: 6200

## 2020-04-04 ENCOUNTER — Emergency Department: Payer: 59

## 2020-04-04 ENCOUNTER — Emergency Department
Admission: EM | Admit: 2020-04-04 | Discharge: 2020-04-04 | Disposition: A | Discharge status: Home / Self Care | Payer: 59 | Attending: Student in an Organized Health Care Education/Training Program | Admitting: Student in an Organized Health Care Education/Training Program

## 2020-04-04 ENCOUNTER — Other Ambulatory Visit: Payer: Self-pay

## 2020-04-04 ENCOUNTER — Encounter: Payer: Self-pay | Admitting: Emergency Medicine

## 2020-04-04 DIAGNOSIS — I110 Hypertensive heart disease with heart failure: Secondary | ICD-10-CM | POA: Diagnosis not present

## 2020-04-04 DIAGNOSIS — I509 Heart failure, unspecified: Secondary | ICD-10-CM | POA: Insufficient documentation

## 2020-04-04 DIAGNOSIS — Z87891 Personal history of nicotine dependence: Secondary | ICD-10-CM | POA: Diagnosis not present

## 2020-04-04 DIAGNOSIS — R42 Dizziness and giddiness: Secondary | ICD-10-CM

## 2020-04-04 DIAGNOSIS — R110 Nausea: Secondary | ICD-10-CM | POA: Diagnosis not present

## 2020-04-04 LAB — BASIC METABOLIC PANEL
Anion gap: 12 (ref 5–15)
BUN: 13 mg/dL (ref 6–20)
CO2: 24 mmol/L (ref 22–32)
Calcium: 9.7 mg/dL (ref 8.9–10.3)
Chloride: 102 mmol/L (ref 98–111)
Creatinine, Ser: 0.74 mg/dL (ref 0.44–1.00)
GFR calc Af Amer: >60 mL/min (ref >60)
GFR calc non Af Amer: >60 mL/min (ref >60)
Glucose, Bld: 109 mg/dL — ABNORMAL HIGH (ref 70–99)
Potassium: 3.6 mmol/L (ref 3.5–5.1)
Sodium: 138 mmol/L (ref 135–145)

## 2020-04-04 LAB — URINALYSIS, COMPLETE (UACMP) WITH MICROSCOPIC
Bacteria, UA: NONE SEEN
Bilirubin Urine: NEGATIVE
Glucose, UA: NEGATIVE mg/dL
Hgb urine dipstick: NEGATIVE
Ketones, ur: NEGATIVE mg/dL
Leukocytes,Ua: NEGATIVE
Nitrite: NEGATIVE
Protein, ur: NEGATIVE mg/dL
Specific Gravity, Urine: 1 — ABNORMAL LOW (ref 1.005–1.030)
WBC, UA: NONE SEEN WBC/hpf (ref 0–5)
pH: 8 (ref 5.0–8.0)

## 2020-04-04 LAB — HEPATIC FUNCTION PANEL
ALT: 11 U/L (ref 0–44)
AST: 15 U/L (ref 15–41)
Albumin: 4 g/dL (ref 3.5–5.0)
Alkaline Phosphatase: 53 U/L (ref 38–126)
Bilirubin, Direct: 0.1 mg/dL (ref 0.0–0.2)
Indirect Bilirubin: 1 mg/dL — ABNORMAL HIGH (ref 0.3–0.9)
Total Bilirubin: 1.1 mg/dL (ref 0.3–1.2)
Total Protein: 6.8 g/dL (ref 6.5–8.1)

## 2020-04-04 LAB — GLUCOSE, CAPILLARY: Glucose-Capillary: 74 mg/dL (ref 70–99)

## 2020-04-04 LAB — CBC
HCT: 41.2 % (ref 36.0–46.0)
Hemoglobin: 13.7 g/dL (ref 12.0–15.0)
MCH: 31 pg (ref 26.0–34.0)
MCHC: 33.3 g/dL (ref 30.0–36.0)
MCV: 93.2 fL (ref 80.0–100.0)
Platelets: 239 10*3/uL (ref 150–400)
RBC: 4.42 MIL/uL (ref 3.87–5.11)
RDW: 13.4 % (ref 11.5–15.5)
WBC: 8.4 10*3/uL (ref 4.0–10.5)
nRBC: 0 % (ref 0.0–0.2)

## 2020-04-04 LAB — TROPONIN I (HIGH SENSITIVITY)
Troponin I (High Sensitivity): 6 ng/L (ref <18)
Troponin I (High Sensitivity): 7 ng/L (ref <18)

## 2020-04-04 LAB — LIPASE, BLOOD: Lipase: 26 U/L (ref 11–51)

## 2020-04-04 MED ORDER — SODIUM CHLORIDE 0.9 % IV BOLUS
500.0000 mL | Freq: Once | INTRAVENOUS | Status: AC
  Administered 2020-04-04: 500 mL via INTRAVENOUS

## 2020-04-04 MED ORDER — ONDANSETRON 4 MG PO TBDP
4.0000 mg | ORAL_TABLET | Freq: Once | ORAL | Status: AC | PRN
  Administered 2020-04-04: 4 mg via ORAL
  Filled 2020-04-04: qty 1

## 2020-04-04 MED ORDER — ALUM & MAG HYDROXIDE-SIMETH 200-200-20 MG/5ML PO SUSP
30.0000 mL | Freq: Once | ORAL | Status: AC
  Administered 2020-04-04: 30 mL via ORAL
  Filled 2020-04-04: qty 30

## 2020-04-04 MED ORDER — MECLIZINE HCL 25 MG PO TABS: 25.0000 mg | ORAL_TABLET | Freq: Three times a day (TID) | ORAL | 0 refills | Status: DC | PRN

## 2020-04-04 MED ORDER — ONDANSETRON HCL 4 MG PO TABS: 4.0000 mg | ORAL_TABLET | Freq: Every day | ORAL | 0 refills | Status: DC | PRN

## 2020-04-04 MED ORDER — LIDOCAINE VISCOUS HCL 2 % MT SOLN
15.0000 mL | Freq: Once | OROMUCOSAL | Status: AC
  Administered 2020-04-04: 15 mL via ORAL
  Filled 2020-04-04: qty 15

## 2020-04-04 MED ORDER — MECLIZINE HCL 25 MG PO TABS
25.0000 mg | ORAL_TABLET | Freq: Once | ORAL | Status: AC
  Administered 2020-04-04: 25 mg via ORAL
  Filled 2020-04-04: qty 1

## 2020-04-04 NOTE — ED Notes (Signed)
Pt transported to CT and Xray. 

## 2020-04-04 NOTE — ED Notes (Signed)
Pt states she's feeling better post medication given. Patient states nausea has mostly subsided and that she didn't feel as dizzy when pivoting in CT.

## 2020-04-04 NOTE — ED Provider Notes (Signed)
[facility] Emergency Department Provider Note    First MD Initiated Contact with Patient 04/04/20 1312     (approximate)  I have reviewed the triage vital signs and the nursing notes.   HISTORY  Chief Complaint Dizziness and Nausea    HPI Alexandria Moore is a 59 y.o. female below listed past medical history presents to the ER for 24 hours of nausea lightheadedness and dizziness.  States that dizziness is worse with looking side to side and feels like the room is spinning around.  Will become very severe for period of time and then subside.  She is also having some chest discomfort.  Denies any shortness of breath.  Not describing any particular pain or discomfort in her abdomen.  Does feel little bit lightheaded.  She is also complaining of some neck discomfort and tingling in her hands which she has been dealing with intermittently since she was diagnosed with Covid.    Past Medical History:  Diagnosis Date  . Arthritis   . CHF (congestive heart failure) (HCC)   . Dyspnea    not so much now since weight loss  . GERD (gastroesophageal reflux disease)   . Heart murmur    not being followed or treated. has lost weight with improvement of murmur  . History of blood transfusion    unsure of when she had transfusion but states she had a terrible reaction and needed medication  . Hypertension   . Hypothyroidism    not on meds at this time  . Sleep apnea    does not use cpap since weight loss   Family History  Problem Relation Age of Onset  . Breast cancer Neg Hx    Past Surgical History:  Procedure Laterality Date  . ANTERIOR CERVICAL DECOMP/DISCECTOMY FUSION N/A 01/18/2020   Procedure: ANTERIOR CERVICAL DECOMPRESSION/DISCECTOMY FUSION 1 LEVEL;  Surgeon: Venetia Night, MD;  Location: [facility] ORS;  Service: Neurosurgery;  Laterality: N/A;  C4-5  . CESAREAN SECTION  1982   x 1  . TOTAL HIP ARTHROPLASTY Left 05/12/2018   Procedure: TOTAL HIP  ARTHROPLASTY ANTERIOR APPROACH;  Surgeon: Kennedy Bucker, MD;  Location: [facility] ORS;  Service: Orthopedics;  Laterality: Left;   Patient Active Problem List   Diagnosis Date Noted  . Cervical myelopathy (HCC) 01/17/2020  . Essential hypertension 12/05/2019  . Pneumonia due to COVID-19 virus 12/05/2019  . Acute respiratory failure with hypoxia (HCC) 12/05/2019  . Congestive heart failure (HCC) 12/05/2019  . Thrombocytopenia (HCC) 12/05/2019  . Elevated troponin level 12/05/2019  . Primary osteoarthritis of both knees 04/25/2019  . Primary osteoarthritis involving multiple joints 04/25/2019  . Bilateral edema of lower extremity 02/15/2019  . Status post total hip replacement, left 05/12/2018  . BMI 40.0-44.9, adult (HCC) 04/20/2018      Prior to Admission medications   Medication Sig Start Date End Date Taking? Authorizing Provider  acetaminophen (TYLENOL) 500 MG tablet Take 2 tablets (1,000 mg total) by mouth every 6 (six) hours. Patient taking differently: Take 1,000 mg by mouth every 6 (six) hours as needed for mild pain.  05/13/18   Evon Slack, PA-C  amLODipine (NORVASC) 10 MG tablet Take 10 mg by mouth daily.     [provider]  ergocalciferol (VITAMIN D2) 1.25 MG (50000 UT) capsule Take 50,000 Units by mouth once a week. 12/28/19   [provider]  gabapentin (NEURONTIN) 300 MG capsule Take 300 mg by mouth 3 (three) times daily. 11/06/19  [provider]  losartan (COZAAR) 100 MG tablet Take 100 mg by mouth daily.     [provider]  meclizine (ANTIVERT) 25 MG tablet Take 1 tablet (25 mg total) by mouth 3 (three) times daily as needed for dizziness. 04/04/20   Merlyn Lot, MD  menthol-cetylpyridinium (CEPACOL) 3 MG lozenge Take 1 lozenge (3 mg total) by mouth as needed for sore throat (sore throat). 01/20/20   Marin Olp, PA-C  metFORMIN (GLUCOPHAGE-XR) 500 MG 24 hr tablet Take 500 mg by mouth daily. 12/28/19   [provider]    methocarbamol (ROBAXIN) 500 MG tablet Take 1 tablet (500 mg total) by mouth every 6 (six) hours. 01/20/20   Marin Olp, PA-C  omeprazole (PRILOSEC) 20 MG capsule Take 20 mg by mouth daily as needed.     [provider]  ondansetron (ZOFRAN) 4 MG tablet Take 1 tablet (4 mg total) by mouth daily as needed. 04/04/20 04/04/21  Merlyn Lot, MD  predniSONE (DELTASONE) 10 MG tablet Take 1-5 tablets by mouth See admin instructions. Take once a day as taker: 5, 5, 4, 4, 3, 3, 2, 2, 1, 1. Start with 5 tablets daily and taper don to 1 tablet every 2 days. 01/17/20   [provider]  SYNTHROID 88 MCG tablet Take 88 mcg by mouth daily. 06/16/19   [provider]  traMADol (ULTRAM) 50 MG tablet Take 1 tablet (50 mg total) by mouth every 6 (six) hours as needed for moderate pain or severe pain. 01/20/20   Marin Olp, PA-C    Allergies Hydromorphone, Lisinopril, Meloxicam, and Adhesive [tape]    Social History Social History   Tobacco Use  . Smoking status: Former Smoker    Packs/day: 0.25    Types: Cigarettes    Quit date: 2014    Years since quitting: 7.4  . Smokeless tobacco: Never Used  Vaping Use  . Vaping Use: Never used  Substance Use Topics  . Alcohol use: Yes  . Drug use: Never    Review of Systems Patient denies headaches, rhinorrhea, blurry vision, numbness, shortness of breath, chest pain, edema, cough, abdominal pain, nausea, vomiting, diarrhea, dysuria, fevers, rashes or hallucinations unless otherwise stated above in HPI. ____________________________________________   PHYSICAL EXAM:  VITAL SIGNS: Vitals:   04/04/20 1924 04/04/20 2006  BP: (!) 142/78 126/80  Pulse: 66 70  Resp: 16 16  Temp:    SpO2: 99% 100%    Constitutional: Alert and oriented.  Eyes: Conjunctivae are normal.  Head: Atraumatic. Nose: No congestion/rhinnorhea. Mouth/Throat: Mucous membranes are moist.   Neck: No stridor. Painless ROM.  Cardiovascular: Normal rate,  regular rhythm. Grossly normal heart sounds.  Good peripheral circulation. Respiratory: Normal respiratory effort.  No retractions. Lungs CTAB. Gastrointestinal: Soft and nontender. No distention. No abdominal bruits. No CVA tenderness. Genitourinary:  Musculoskeletal: No lower extremity tenderness nor edema.  No joint effusions. Neurologic:  CN- intact.  No facial droop, Normal FNF.  Normal heel to shin.  Sensation intact bilaterally. Normal speech and language. No gross focal neurologic deficits are appreciated. No gait instability. Skin:  Skin is warm, dry and intact. No rash noted. Psychiatric: Mood and affect are normal. Speech and behavior are normal.  ____________________________________________   LABS (all labs ordered are listed, but only abnormal results are displayed)  Results for orders placed or performed during the hospital encounter of 04/04/20 (from the past 24 hour(s))  Basic metabolic panel     Status: Abnormal   Collection Time: 04/04/20  8:33 AM  Result Value Ref Range   Sodium 138 135 - 145 mmol/L   Potassium 3.6 3.5 - 5.1 mmol/L   Chloride 102 98 - 111 mmol/L   CO2 24 22 - 32 mmol/L   Glucose, Bld 109 (H) 70 - 99 mg/dL   BUN 13 6 - 20 mg/dL   Creatinine, Ser 5.46 0.44 - 1.00 mg/dL   Calcium 9.7 8.9 - 27.0 mg/dL   GFR calc non Af Amer >60 >60 mL/min   GFR calc Af Amer >60 >60 mL/min   Anion gap 12 5 - 15  CBC     Status: None   Collection Time: 04/04/20  8:33 AM  Result Value Ref Range   WBC 8.4 4.0 - 10.5 K/uL   RBC 4.42 3.87 - 5.11 MIL/uL   Hemoglobin 13.7 12.0 - 15.0 g/dL   HCT 35.0 36 - 46 %   MCV 93.2 80.0 - 100.0 fL   MCH 31.0 26.0 - 34.0 pg   MCHC 33.3 30.0 - 36.0 g/dL   RDW 09.3 81.8 - 29.9 %   Platelets 239 150 - 400 K/uL   nRBC 0.0 0.0 - 0.2 %  Lipase, blood     Status: None   Collection Time: 04/04/20  8:33 AM  Result Value Ref Range   Lipase 26 11 - 51 U/L  Hepatic function panel     Status: Abnormal   Collection Time: 04/04/20  8:33 AM    Result Value Ref Range   Total Protein 6.8 6.5 - 8.1 g/dL   Albumin 4.0 3.5 - 5.0 g/dL   AST 15 15 - 41 U/L   ALT 11 0 - 44 U/L   Alkaline Phosphatase 53 38 - 126 U/L   Total Bilirubin 1.1 0.3 - 1.2 mg/dL   Bilirubin, Direct 0.1 0.0 - 0.2 mg/dL   Indirect Bilirubin 1.0 (H) 0.3 - 0.9 mg/dL  Glucose, capillary     Status: None   Collection Time: 04/04/20  8:38 AM  Result Value Ref Range   Glucose-Capillary 74 70 - 99 mg/dL   Comment 1 Notify RN   Urinalysis, Complete w Microscopic     Status: Abnormal   Collection Time: 04/04/20  8:39 AM  Result Value Ref Range   Color, Urine COLORLESS (A) YELLOW   APPearance CLEAR (A) CLEAR   Specific Gravity, Urine 1.000 (L) 1.005 - 1.030   pH 8.0 5.0 - 8.0   Glucose, UA NEGATIVE NEGATIVE mg/dL   Hgb urine dipstick NEGATIVE NEGATIVE   Bilirubin Urine NEGATIVE NEGATIVE   Ketones, ur NEGATIVE NEGATIVE mg/dL   Protein, ur NEGATIVE NEGATIVE mg/dL   Nitrite NEGATIVE NEGATIVE   Leukocytes,Ua NEGATIVE NEGATIVE   RBC / HPF 0-5 0 - 5 RBC/hpf   WBC, UA NONE SEEN 0 - 5 WBC/hpf   Bacteria, UA NONE SEEN NONE SEEN   Squamous Epithelial / LPF 0-5 0 - 5  Troponin I (High Sensitivity)     Status: None   Collection Time: 04/04/20  1:47 PM  Result Value Ref Range   Troponin I (High Sensitivity) 6 <18 ng/L  Troponin I (High Sensitivity)     Status: None   Collection Time: 04/04/20  3:33 PM  Result Value Ref Range   Troponin I (High Sensitivity) 7 <18 ng/L   ____________________________________________  EKG My review and personal interpretation at Time: 8:21   Indication: weakness  Rate: 65  Rhythm: normal Axis: normal Other: nonspecific st abn, no stemi ____________________________________________  RADIOLOGY  I personally reviewed all radiographic images ordered to evaluate for the above acute complaints and reviewed radiology reports and findings.  These findings were personally discussed with the patient.  Please see medical record for radiology  report.  ____________________________________________   PROCEDURES  Procedure(s) performed:  Procedures    Critical Care performed: no ____________________________________________   INITIAL IMPRESSION / ASSESSMENT AND PLAN / ED COURSE  Pertinent labs & imaging results that were available during my care of the patient were reviewed by me and considered in my medical decision making (see chart for details).   DDX: bppv, cva, cervical strain, radiculopathy, cervical stenosis, acs, enteritis, gastritis, doubt pe or dissection.  Alexandria Moore is a 59 y.o. who presents to the ED with symptoms of nausea lightheadedness and dizziness and intermittent weakness of the right side sometimes involving bilateral upper extremities.  She denying any active chest pain right now.  Denies any abdominal pain at this time.  Blood work as well as imaging will be ordered for the above differential.  Her abdominal exam is soft and benign. The patient will be placed on continuous pulse oximetry and telemetry for monitoring.  Laboratory evaluation will be sent to evaluate for the above complaints.     Clinical Course as of Apr 04 2106  Thu Apr 04, 2020  1447 Will order MRI this the patient's CT imaging. To evaluate for any evidence of cord compression or CVA. Work-up otherwise reassuring.   [PR]  1800 Patient reassessed.  I discussed results of MRI with the patient.  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  Discussed outpatient follow-up.  Currently he is awaiting repeat troponin to ensure this is not cardiac in nature but she otherwise is feeling well.  She has requested a GI cocktail she is feeling some indigestion denies any chest pain or pressure at this time.  Will reassess.   [PR]    Clinical Course User Index [PR] Willy Eddy, MD    The patient was evaluated in Emergency Department today for the symptoms described in the  history of present illness. He/she was evaluated in the context of the global COVID-19 pandemic, which necessitated consideration that the patient might be at risk for infection with the SARS-CoV-2 virus that causes COVID-19. Institutional protocols and algorithms that pertain to the evaluation of patients at risk for COVID-19 are in a state of rapid change based on information released by regulatory bodies including the CDC and federal and state organizations. These policies and algorithms were followed during the patient's care in the ED.  As part of my medical decision making, I reviewed the following data within the electronic MEDICAL RECORD NUMBER Nursing notes reviewed and incorporated, Labs reviewed, notes from prior ED visits and [place] Controlled Substance Database   ____________________________________________   FINAL CLINICAL IMPRESSION(S) / ED DIAGNOSES  Final diagnoses:  Dizziness  Nausea      NEW MEDICATIONS STARTED DURING THIS VISIT:  Discharge Medication List as of 04/04/2020  7:44 PM    START taking these medications   Details  meclizine (ANTIVERT) 25 MG tablet Take 1 tablet (25 mg total) by mouth 3 (three) times daily as needed for dizziness., Starting Thu 04/04/2020, Normal    ondansetron (ZOFRAN) 4 MG tablet Take 1 tablet (4 mg total) by mouth daily as needed., Starting Thu 04/04/2020, Until Fri 04/04/2021 at 2359, Normal         Note:  This document was  prepared using Conservation officer, historic buildings and may include unintentional dictation errors.    Willy Eddy, MD 04/04/20 2107

## 2020-04-04 NOTE — ED Triage Notes (Signed)
Patient reports after eating last night, she became dizzy and woozy. Reports headache on left side of head. States she also had bad indigestion and nausea.

## 2020-04-04 NOTE — ED Notes (Signed)
Pt states that she has been dizzy and nauseas since this morning. Patient denies fevers and flu like symptoms.

## 2020-04-04 NOTE — ED Triage Notes (Signed)
First Nurse Note:  Arrives via [facility].  C/O abdominal pain since last night.   PMH:  HTN.  CBG 107.  HR:  65.  VS wnl.  100% RA

## 2020-05-02 ENCOUNTER — Other Ambulatory Visit: Payer: Self-pay | Admitting: Family

## 2020-05-02 DIAGNOSIS — Z1231 Encounter for screening mammogram for malignant neoplasm of breast: Secondary | ICD-10-CM

## 2020-05-30 ENCOUNTER — Other Ambulatory Visit: Payer: Self-pay

## 2020-05-30 ENCOUNTER — Ambulatory Visit
Admission: RE | Admit: 2020-05-30 | Discharge: 2020-05-30 | Disposition: A | Discharge status: Home / Self Care | Payer: BC Managed Care – PPO | Source: Ambulatory Visit | Attending: Family | Admitting: Family

## 2020-05-30 DIAGNOSIS — Z1231 Encounter for screening mammogram for malignant neoplasm of breast: Secondary | ICD-10-CM | POA: Insufficient documentation

## 2020-08-28 ENCOUNTER — Other Ambulatory Visit: Payer: Self-pay | Admitting: Family

## 2020-08-28 DIAGNOSIS — Z1231 Encounter for screening mammogram for malignant neoplasm of breast: Secondary | ICD-10-CM

## 2020-12-17 ENCOUNTER — Other Ambulatory Visit: Payer: Self-pay | Admitting: Primary Care

## 2020-12-17 DIAGNOSIS — N644 Mastodynia: Secondary | ICD-10-CM

## 2020-12-25 ENCOUNTER — Other Ambulatory Visit: Payer: Self-pay

## 2020-12-25 ENCOUNTER — Ambulatory Visit
Admission: RE | Admit: 2020-12-25 | Discharge: 2020-12-25 | Disposition: A | Discharge status: Home / Self Care | Payer: BC Managed Care – PPO | Source: Ambulatory Visit | Attending: Primary Care | Admitting: Primary Care

## 2020-12-25 DIAGNOSIS — N644 Mastodynia: Secondary | ICD-10-CM | POA: Diagnosis present

## 2021-01-01 ENCOUNTER — Other Ambulatory Visit: Payer: Self-pay | Admitting: Orthopedic Surgery

## 2021-01-16 ENCOUNTER — Encounter
Admission: RE | Admit: 2021-01-16 | Discharge: 2021-01-16 | Disposition: A | Discharge status: Home / Self Care | Payer: BC Managed Care – PPO | Source: Ambulatory Visit | Attending: Orthopedic Surgery | Admitting: Orthopedic Surgery

## 2021-01-16 ENCOUNTER — Other Ambulatory Visit: Payer: Self-pay

## 2021-01-16 DIAGNOSIS — Z01812 Encounter for preprocedural laboratory examination: Secondary | ICD-10-CM | POA: Insufficient documentation

## 2021-01-16 HISTORY — DX: Prediabetes: R73.03

## 2021-01-16 HISTORY — DX: Anxiety disorder, unspecified: F41.9

## 2021-01-16 HISTORY — DX: Nontoxic goiter, unspecified: E04.9

## 2021-01-16 LAB — URINALYSIS, ROUTINE W REFLEX MICROSCOPIC
Bilirubin Urine: NEGATIVE
Glucose, UA: NEGATIVE mg/dL
Hgb urine dipstick: NEGATIVE
Ketones, ur: NEGATIVE mg/dL
Leukocytes,Ua: NEGATIVE
Nitrite: NEGATIVE
Protein, ur: NEGATIVE mg/dL
Specific Gravity, Urine: 1.02 (ref 1.005–1.030)
pH: 6 (ref 5.0–8.0)

## 2021-01-16 LAB — COMPREHENSIVE METABOLIC PANEL
ALT: 10 U/L (ref 0–44)
AST: 14 U/L — ABNORMAL LOW (ref 15–41)
Albumin: 4 g/dL (ref 3.5–5.0)
Alkaline Phosphatase: 53 U/L (ref 38–126)
Anion gap: 7 (ref 5–15)
BUN: 17 mg/dL (ref 8–23)
CO2: 25 mmol/L (ref 22–32)
Calcium: 9.5 mg/dL (ref 8.9–10.3)
Chloride: 107 mmol/L (ref 98–111)
Creatinine, Ser: 0.78 mg/dL (ref 0.44–1.00)
GFR, Estimated: >60 mL/min (ref >60)
Glucose, Bld: 88 mg/dL (ref 70–99)
Potassium: 3.8 mmol/L (ref 3.5–5.1)
Sodium: 139 mmol/L (ref 135–145)
Total Bilirubin: 1.4 mg/dL — ABNORMAL HIGH (ref 0.3–1.2)
Total Protein: 6.9 g/dL (ref 6.5–8.1)

## 2021-01-16 LAB — CBC WITH DIFFERENTIAL/PLATELET
Abs Immature Granulocytes: 0.01 10*3/uL (ref 0.00–0.07)
Basophils Absolute: 0 10*3/uL (ref 0.0–0.1)
Basophils Relative: 1 %
Eosinophils Absolute: 0.2 10*3/uL (ref 0.0–0.5)
Eosinophils Relative: 3 %
HCT: 37.9 % (ref 36.0–46.0)
Hemoglobin: 12.4 g/dL (ref 12.0–15.0)
Immature Granulocytes: 0 %
Lymphocytes Relative: 29 %
Lymphs Abs: 1.8 10*3/uL (ref 0.7–4.0)
MCH: 30.7 pg (ref 26.0–34.0)
MCHC: 32.7 g/dL (ref 30.0–36.0)
MCV: 93.8 fL (ref 80.0–100.0)
Monocytes Absolute: 0.4 10*3/uL (ref 0.1–1.0)
Monocytes Relative: 7 %
Neutro Abs: 3.7 10*3/uL (ref 1.7–7.7)
Neutrophils Relative %: 60 %
Platelets: 217 10*3/uL (ref 150–400)
RBC: 4.04 MIL/uL (ref 3.87–5.11)
RDW: 13 % (ref 11.5–15.5)
WBC: 6.1 10*3/uL (ref 4.0–10.5)
nRBC: 0 % (ref 0.0–0.2)

## 2021-01-16 LAB — SURGICAL PCR SCREEN
MRSA, PCR: NEGATIVE
Staphylococcus aureus: POSITIVE — AB

## 2021-01-16 NOTE — Patient Instructions (Addendum)
Your procedure is scheduled on:01-28-21 TUESDAY Report to the Registration Desk on the 1st floor of the Medical Mall-Then proceed to the 2nd floor Surgery Desk in the Medical Mall To find out your arrival time, please call [phone] between 1PM - 3PM on:01-27-21 MONDAY  REMEMBER: Instructions that are not followed completely may result in serious medical risk, up to and including death; or upon the discretion of your surgeon and anesthesiologist your surgery may need to be rescheduled.  Do not eat food after midnight the night before surgery.  No gum chewing, lozengers or hard candies.  You may however, drink CLEAR liquids up to 2 hours before you are scheduled to arrive for your surgery. Do not drink anything within 2 hours of your scheduled arrival time.  Clear liquids include: - water  - apple juice without pulp - gatorade - black coffee or tea (Do NOT add milk or creamers to the coffee or tea) Do NOT drink anything that is not on this list.  In addition, your doctor has ordered for you to drink the provided  Ensure Pre-Surgery Clear Carbohydrate Drink  Drinking this carbohydrate drink up to two hours before surgery helps to reduce insulin resistance and improve patient outcomes. Please complete drinking 2 hours prior to scheduled arrival time.  TAKE THESE MEDICATIONS THE MORNING OF SURGERY WITH A SIP OF WATER: -NORVASC (AMLODIPINE) -SYNTHROID (LEVOTHYROXINE)  One week prior to surgery: Stop Anti-inflammatories (NSAIDS) such as Advil, Aleve, Ibuprofen, Motrin, Naproxen, Naprosyn and Aspirin based products such as Excedrin, Goodys Powder, BC Powder-OK TO TAKE TYLENOL IF NEEDED  Stop ANY OVER THE COUNTER supplements until after surgery-STOP FISH OIL 7 DAYS PRIOR TO SURGERY-HOWEVER, YOU MAY CONTINUE YOUR VITAMIN D AND FOLIC ACID UP UNTIL THE DAY PRIOR TO SURGERY  No Alcohol for 24 hours before or after surgery.  No Smoking including e-cigarettes for 24 hours prior to surgery.   No chewable tobacco products for at least 6 hours prior to surgery.  No nicotine patches on the day of surgery.  Do not use any "recreational" drugs for at least a week prior to your surgery.  Please be advised that the combination of cocaine and anesthesia may have negative outcomes, up to and including death. If you test positive for cocaine, your surgery will be cancelled.  On the morning of surgery brush your teeth with toothpaste and water, you may rinse your mouth with mouthwash if you wish. Do not swallow any toothpaste or mouthwash.  Do not wear jewelry, make-up, hairpins, clips or nail polish.  Do not wear lotions, powders, or perfumes.   Do not shave body from the neck down 48 hours prior to surgery just in case you cut yourself which could leave a site for infection.  Also, freshly shaved skin may become irritated if using the CHG soap.  Contact lenses, hearing aids and dentures may not be worn into surgery.  Do not bring valuables to the hospital. [facility] is not responsible for any missing/lost belongings or valuables.   Use CHG Soap as directed on instruction sheet.  Notify your doctor if there is any change in your medical condition (cold, fever, infection).  Wear comfortable clothing (specific to your surgery type) to the hospital.  Plan for stool softeners for home use; pain medications have a tendency to cause constipation. You can also help prevent constipation by eating foods high in fiber such as fruits and vegetables and drinking plenty of fluids as your diet allows.  After surgery,  you can help prevent lung complications by doing breathing exercises.  Take deep breaths and cough every 1-2 hours. Your doctor may order a device called an Incentive Spirometer to help you take deep breaths. When coughing or sneezing, hold a pillow firmly against your incision with both hands. This is called "splinting." Doing this helps protect your incision. It also decreases  belly discomfort.  If you are being admitted to the hospital overnight, leave your suitcase in the car. After surgery it may be brought to your room.  If you are being discharged the day of surgery, you will not be allowed to drive home. You will need a responsible adult (18 years or older) to drive you home and stay with you that night.   If you are taking public transportation, you will need to have a responsible adult (18 years or older) with you. Please confirm with your physician that it is acceptable to use public transportation.   Please call the Pre-admissions Testing Dept. at [phone] if you have any questions about these instructions.  Surgery Visitation Policy:  Patients undergoing a surgery or procedure may have one family member or support person with them as long as that person is not COVID-19 positive or experiencing its symptoms.  That person may remain in the waiting area during the procedure.  Inpatient Visitation:    Visiting hours are 7 a.m. to 8 p.m. Inpatients will be allowed two visitors daily. The visitors may change each day during the patient's stay. No visitors under the age of 67. Any visitor under the age of 85 must be accompanied by an adult. The visitor must pass COVID-19 screenings, use hand sanitizer when entering and exiting the patient's room and wear a mask at all times, including in the patient's room. Patients must also wear a mask when staff or their visitor are in the room. Masking is required regardless of vaccination status.

## 2021-01-24 ENCOUNTER — Other Ambulatory Visit
Admission: RE | Admit: 2021-01-24 | Discharge: 2021-01-24 | Disposition: A | Discharge status: Home / Self Care | Payer: BC Managed Care – PPO | Source: Ambulatory Visit | Attending: Orthopedic Surgery | Admitting: Orthopedic Surgery

## 2021-01-24 ENCOUNTER — Other Ambulatory Visit: Payer: Self-pay

## 2021-01-24 DIAGNOSIS — Z01812 Encounter for preprocedural laboratory examination: Secondary | ICD-10-CM | POA: Insufficient documentation

## 2021-01-24 DIAGNOSIS — Z20822 Contact with and (suspected) exposure to covid-19: Secondary | ICD-10-CM | POA: Diagnosis not present

## 2021-01-24 LAB — SARS CORONAVIRUS 2 (TAT 6-24 HRS): SARS Coronavirus 2: NEGATIVE

## 2021-01-27 ENCOUNTER — Other Ambulatory Visit: Admission: RE | Admit: 2021-01-27 | Payer: BC Managed Care – PPO | Source: Ambulatory Visit

## 2021-01-28 ENCOUNTER — Encounter: Payer: Self-pay | Admitting: Orthopedic Surgery

## 2021-01-28 ENCOUNTER — Inpatient Hospital Stay
Admission: RE | Admit: 2021-01-28 | Discharge: 2021-02-01 | DRG: 470 | Disposition: A | Discharge status: Home Health | Payer: BC Managed Care – PPO | Attending: Orthopedic Surgery | Admitting: Orthopedic Surgery

## 2021-01-28 ENCOUNTER — Encounter
Admission: RE | Disposition: A | Discharge status: Home Health | Payer: Self-pay | Source: Home / Self Care | Attending: Orthopedic Surgery

## 2021-01-28 ENCOUNTER — Other Ambulatory Visit: Payer: Self-pay

## 2021-01-28 ENCOUNTER — Ambulatory Visit: Payer: BC Managed Care – PPO | Admitting: Registered Nurse

## 2021-01-28 ENCOUNTER — Observation Stay: Payer: BC Managed Care – PPO

## 2021-01-28 DIAGNOSIS — Z6841 Body Mass Index (BMI) 40.0 and over, adult: Secondary | ICD-10-CM

## 2021-01-28 DIAGNOSIS — Z79890 Hormone replacement therapy (postmenopausal): Secondary | ICD-10-CM

## 2021-01-28 DIAGNOSIS — M069 Rheumatoid arthritis, unspecified: Secondary | ICD-10-CM | POA: Diagnosis present

## 2021-01-28 DIAGNOSIS — Z8616 Personal history of COVID-19: Secondary | ICD-10-CM

## 2021-01-28 DIAGNOSIS — M1711 Unilateral primary osteoarthritis, right knee: Principal | ICD-10-CM | POA: Diagnosis present

## 2021-01-28 DIAGNOSIS — Z87891 Personal history of nicotine dependence: Secondary | ICD-10-CM

## 2021-01-28 DIAGNOSIS — E785 Hyperlipidemia, unspecified: Secondary | ICD-10-CM | POA: Diagnosis present

## 2021-01-28 DIAGNOSIS — G8918 Other acute postprocedural pain: Secondary | ICD-10-CM

## 2021-01-28 DIAGNOSIS — I110 Hypertensive heart disease with heart failure: Secondary | ICD-10-CM | POA: Diagnosis present

## 2021-01-28 DIAGNOSIS — Z79899 Other long term (current) drug therapy: Secondary | ICD-10-CM

## 2021-01-28 DIAGNOSIS — I509 Heart failure, unspecified: Secondary | ICD-10-CM | POA: Diagnosis present

## 2021-01-28 DIAGNOSIS — E039 Hypothyroidism, unspecified: Secondary | ICD-10-CM | POA: Diagnosis present

## 2021-01-28 DIAGNOSIS — K219 Gastro-esophageal reflux disease without esophagitis: Secondary | ICD-10-CM | POA: Diagnosis present

## 2021-01-28 DIAGNOSIS — Z96651 Presence of right artificial knee joint: Secondary | ICD-10-CM

## 2021-01-28 HISTORY — DX: Other specified health status: Z78.9

## 2021-01-28 HISTORY — PX: TOTAL KNEE ARTHROPLASTY: SHX125

## 2021-01-28 HISTORY — DX: Thyrotoxicosis, unspecified without thyrotoxic crisis or storm: E05.90

## 2021-01-28 LAB — CBC
HCT: 36.6 % (ref 36.0–46.0)
Hemoglobin: 11.8 g/dL — ABNORMAL LOW (ref 12.0–15.0)
MCH: 30.2 pg (ref 26.0–34.0)
MCHC: 32.2 g/dL (ref 30.0–36.0)
MCV: 93.6 fL (ref 80.0–100.0)
Platelets: 189 10*3/uL (ref 150–400)
RBC: 3.91 MIL/uL (ref 3.87–5.11)
RDW: 13.6 % (ref 11.5–15.5)
WBC: 8.2 10*3/uL (ref 4.0–10.5)
nRBC: 0 % (ref 0.0–0.2)

## 2021-01-28 LAB — CREATININE, SERUM
Creatinine, Ser: 0.7 mg/dL (ref 0.44–1.00)
GFR, Estimated: >60 mL/min (ref >60)

## 2021-01-28 SURGERY — ARTHROPLASTY, KNEE, TOTAL
Anesthesia: Choice | Site: Knee | Laterality: Right

## 2021-01-28 MED ORDER — LACTATED RINGERS IV SOLN: INTRAVENOUS | Status: DC

## 2021-01-28 MED ORDER — CEFAZOLIN SODIUM-DEXTROSE 2-4 GM/100ML-% IV SOLN
2.0000 g | Freq: Four times a day (QID) | INTRAVENOUS | Status: AC
  Administered 2021-01-28 – 2021-01-29 (×3): 2 g via INTRAVENOUS
  Filled 2021-01-28 (×3): qty 100

## 2021-01-28 MED ORDER — SODIUM CHLORIDE 0.9 % IV SOLN: INTRAVENOUS | Status: DC

## 2021-01-28 MED ORDER — BUPIVACAINE LIPOSOME 1.3 % IJ SUSP
INTRAMUSCULAR | Status: AC
  Filled 2021-01-28: qty 20

## 2021-01-28 MED ORDER — MORPHINE SULFATE (PF) 10 MG/ML IV SOLN
INTRAVENOUS | Status: AC
  Filled 2021-01-28: qty 1

## 2021-01-28 MED ORDER — MIDAZOLAM HCL 2 MG/2ML IJ SOLN
INTRAMUSCULAR | Status: AC
  Filled 2021-01-28: qty 2

## 2021-01-28 MED ORDER — NEOMYCIN-POLYMYXIN B GU 40-200000 IR SOLN
Status: DC | PRN
  Administered 2021-01-28: 14 mL

## 2021-01-28 MED ORDER — ZOLPIDEM TARTRATE 5 MG PO TABS
5.0000 mg | ORAL_TABLET | Freq: Every evening | ORAL | Status: DC | PRN
  Administered 2021-01-29: 5 mg via ORAL
  Filled 2021-01-28: qty 1

## 2021-01-28 MED ORDER — PHENYLEPHRINE HCL (PRESSORS) 10 MG/ML IV SOLN
INTRAVENOUS | Status: DC | PRN
  Administered 2021-01-28: 50 ug via INTRAVENOUS
  Administered 2021-01-28 (×2): 100 ug via INTRAVENOUS

## 2021-01-28 MED ORDER — HYDROCODONE-ACETAMINOPHEN 7.5-325 MG PO TABS
1.0000 | ORAL_TABLET | ORAL | Status: DC | PRN
  Administered 2021-01-28 – 2021-01-29 (×4): 2 via ORAL
  Filled 2021-01-28 (×5): qty 2

## 2021-01-28 MED ORDER — ORAL CARE MOUTH RINSE: 15.0000 mL | Freq: Once | OROMUCOSAL | Status: AC

## 2021-01-28 MED ORDER — OXYCODONE HCL 5 MG/5ML PO SOLN: 5.0000 mg | Freq: Once | ORAL | Status: DC | PRN

## 2021-01-28 MED ORDER — BUPIVACAINE HCL (PF) 0.5 % IJ SOLN
INTRAMUSCULAR | Status: DC | PRN
  Administered 2021-01-28: 2.4 mL

## 2021-01-28 MED ORDER — CEFAZOLIN SODIUM-DEXTROSE 2-4 GM/100ML-% IV SOLN
INTRAVENOUS | Status: AC
  Filled 2021-01-28: qty 100

## 2021-01-28 MED ORDER — FAMOTIDINE 20 MG PO TABS
ORAL_TABLET | ORAL | Status: AC
  Administered 2021-01-28: 20 mg via ORAL
  Filled 2021-01-28: qty 1

## 2021-01-28 MED ORDER — MENTHOL 3 MG MT LOZG
1.0000 | LOZENGE | OROMUCOSAL | Status: DC | PRN
  Filled 2021-01-28: qty 9

## 2021-01-28 MED ORDER — PHENOL 1.4 % MT LIQD
1.0000 | OROMUCOSAL | Status: DC | PRN
  Filled 2021-01-28: qty 177

## 2021-01-28 MED ORDER — METHOCARBAMOL 1000 MG/10ML IJ SOLN
500.0000 mg | Freq: Four times a day (QID) | INTRAVENOUS | Status: DC | PRN
  Filled 2021-01-28: qty 5

## 2021-01-28 MED ORDER — CHLORHEXIDINE GLUCONATE 0.12 % MT SOLN: 15.0000 mL | Freq: Once | OROMUCOSAL | Status: AC

## 2021-01-28 MED ORDER — CHLORHEXIDINE GLUCONATE 0.12 % MT SOLN
OROMUCOSAL | Status: AC
  Administered 2021-01-28: 15 mL via OROMUCOSAL
  Filled 2021-01-28: qty 15

## 2021-01-28 MED ORDER — ACETAMINOPHEN 325 MG PO TABS
325.0000 mg | ORAL_TABLET | Freq: Four times a day (QID) | ORAL | Status: DC | PRN
Start: 2021-01-29 — End: 2021-02-01

## 2021-01-28 MED ORDER — ONDANSETRON HCL 4 MG PO TABS
4.0000 mg | ORAL_TABLET | Freq: Four times a day (QID) | ORAL | Status: DC | PRN
  Administered 2021-01-29 – 2021-01-30 (×2): 4 mg via ORAL
  Filled 2021-01-28 (×2): qty 1

## 2021-01-28 MED ORDER — METHOCARBAMOL 500 MG PO TABS: 500.0000 mg | ORAL_TABLET | Freq: Four times a day (QID) | ORAL | 0 refills | Status: DC | PRN

## 2021-01-28 MED ORDER — HYDROCODONE-ACETAMINOPHEN 5-325 MG PO TABS: 1.0000 | ORAL_TABLET | ORAL | Status: DC | PRN

## 2021-01-28 MED ORDER — METHOTREXATE 2.5 MG PO TABS: 15.0000 mg | ORAL_TABLET | ORAL | Status: DC

## 2021-01-28 MED ORDER — ENOXAPARIN SODIUM 30 MG/0.3ML ~~LOC~~ SOLN
30.0000 mg | Freq: Two times a day (BID) | SUBCUTANEOUS | Status: DC
  Administered 2021-01-29 – 2021-02-01 (×7): 30 mg via SUBCUTANEOUS
  Filled 2021-01-28 (×7): qty 0.3

## 2021-01-28 MED ORDER — ONDANSETRON HCL 4 MG/2ML IJ SOLN: 4.0000 mg | Freq: Once | INTRAMUSCULAR | Status: DC | PRN

## 2021-01-28 MED ORDER — SODIUM CHLORIDE 0.9 % IV SOLN
INTRAVENOUS | Status: DC | PRN
  Administered 2021-01-28: 30 ug/min via INTRAVENOUS

## 2021-01-28 MED ORDER — GABAPENTIN 300 MG PO CAPS
300.0000 mg | ORAL_CAPSULE | Freq: Three times a day (TID) | ORAL | Status: DC | PRN
  Administered 2021-01-28 – 2021-01-30 (×3): 300 mg via ORAL
  Filled 2021-01-28 (×3): qty 1

## 2021-01-28 MED ORDER — ONDANSETRON HCL 4 MG/2ML IJ SOLN
INTRAMUSCULAR | Status: DC | PRN
  Administered 2021-01-28: 4 mg via INTRAVENOUS

## 2021-01-28 MED ORDER — LOSARTAN POTASSIUM 50 MG PO TABS
100.0000 mg | ORAL_TABLET | Freq: Every day | ORAL | Status: DC
  Administered 2021-01-29 – 2021-02-01 (×4): 100 mg via ORAL
  Filled 2021-01-28 (×5): qty 2

## 2021-01-28 MED ORDER — TRAMADOL HCL 50 MG PO TABS
50.0000 mg | ORAL_TABLET | Freq: Four times a day (QID) | ORAL | Status: DC
  Administered 2021-01-28 – 2021-02-01 (×11): 50 mg via ORAL
  Filled 2021-01-28 (×14): qty 1

## 2021-01-28 MED ORDER — DOCUSATE SODIUM 100 MG PO CAPS
100.0000 mg | ORAL_CAPSULE | Freq: Two times a day (BID) | ORAL | Status: DC
  Administered 2021-01-28 – 2021-02-01 (×8): 100 mg via ORAL
  Filled 2021-01-28 (×9): qty 1

## 2021-01-28 MED ORDER — DIPHENHYDRAMINE HCL 12.5 MG/5ML PO ELIX
12.5000 mg | ORAL_SOLUTION | ORAL | Status: DC | PRN
  Filled 2021-01-28: qty 10

## 2021-01-28 MED ORDER — ALUM & MAG HYDROXIDE-SIMETH 200-200-20 MG/5ML PO SUSP: 30.0000 mL | ORAL | Status: DC | PRN

## 2021-01-28 MED ORDER — FOLIC ACID 1 MG PO TABS
1.0000 mg | ORAL_TABLET | Freq: Every day | ORAL | Status: DC
  Administered 2021-01-29 – 2021-02-01 (×4): 1 mg via ORAL
  Filled 2021-01-28 (×4): qty 1

## 2021-01-28 MED ORDER — ACETAMINOPHEN 10 MG/ML IV SOLN: 1000.0000 mg | Freq: Once | INTRAVENOUS | Status: DC | PRN

## 2021-01-28 MED ORDER — ENOXAPARIN SODIUM 40 MG/0.4ML ~~LOC~~ SOLN: 40.0000 mg | SUBCUTANEOUS | 0 refills | Status: DC

## 2021-01-28 MED ORDER — FAMOTIDINE 20 MG PO TABS: 20.0000 mg | ORAL_TABLET | Freq: Once | ORAL | Status: AC

## 2021-01-28 MED ORDER — ONDANSETRON HCL 4 MG/2ML IJ SOLN: 4.0000 mg | Freq: Four times a day (QID) | INTRAMUSCULAR | Status: DC | PRN

## 2021-01-28 MED ORDER — METOCLOPRAMIDE HCL 5 MG/ML IJ SOLN: 5.0000 mg | Freq: Three times a day (TID) | INTRAMUSCULAR | Status: DC | PRN

## 2021-01-28 MED ORDER — HYDROCODONE-ACETAMINOPHEN 5-325 MG PO TABS: 1.0000 | ORAL_TABLET | Freq: Four times a day (QID) | ORAL | 0 refills | Status: DC | PRN

## 2021-01-28 MED ORDER — MIDAZOLAM HCL 5 MG/5ML IJ SOLN
INTRAMUSCULAR | Status: DC | PRN
  Administered 2021-01-28: 2 mg via INTRAVENOUS

## 2021-01-28 MED ORDER — MAGNESIUM CITRATE PO SOLN
1.0000 | Freq: Once | ORAL | Status: DC | PRN
  Filled 2021-01-28: qty 296

## 2021-01-28 MED ORDER — FENTANYL CITRATE (PF) 100 MCG/2ML IJ SOLN: 25.0000 ug | INTRAMUSCULAR | Status: DC | PRN

## 2021-01-28 MED ORDER — SODIUM CHLORIDE 0.9 % IV SOLN
INTRAVENOUS | Status: DC | PRN
  Administered 2021-01-28: 60 mL

## 2021-01-28 MED ORDER — DOCUSATE SODIUM 100 MG PO CAPS: 100.0000 mg | ORAL_CAPSULE | Freq: Two times a day (BID) | ORAL | 0 refills | Status: DC

## 2021-01-28 MED ORDER — SODIUM CHLORIDE FLUSH 0.9 % IV SOLN
INTRAVENOUS | Status: AC
  Filled 2021-01-28: qty 40

## 2021-01-28 MED ORDER — NEOMYCIN-POLYMYXIN B GU 40-200000 IR SOLN
Status: AC
  Filled 2021-01-28: qty 20

## 2021-01-28 MED ORDER — PROPOFOL 10 MG/ML IV BOLUS
INTRAVENOUS | Status: AC
  Filled 2021-01-28: qty 20

## 2021-01-28 MED ORDER — HYDROCODONE-ACETAMINOPHEN 7.5-325 MG PO TABS
ORAL_TABLET | ORAL | Status: AC
  Administered 2021-01-28: 2 via ORAL
  Filled 2021-01-28: qty 2

## 2021-01-28 MED ORDER — TRAMADOL HCL 50 MG PO TABS: 50.0000 mg | ORAL_TABLET | Freq: Four times a day (QID) | ORAL | 0 refills | Status: DC | PRN

## 2021-01-28 MED ORDER — BUPIVACAINE-EPINEPHRINE (PF) 0.25% -1:200000 IJ SOLN
INTRAMUSCULAR | Status: DC | PRN
  Administered 2021-01-28: 30 mL

## 2021-01-28 MED ORDER — METHOCARBAMOL 500 MG PO TABS
500.0000 mg | ORAL_TABLET | Freq: Four times a day (QID) | ORAL | Status: DC | PRN
  Administered 2021-01-28 – 2021-01-30 (×6): 500 mg via ORAL
  Filled 2021-01-28 (×8): qty 1

## 2021-01-28 MED ORDER — PROPOFOL 500 MG/50ML IV EMUL
INTRAVENOUS | Status: DC | PRN
  Administered 2021-01-28: 125 ug/kg/min via INTRAVENOUS

## 2021-01-28 MED ORDER — OXYCODONE HCL 5 MG PO TABS: 5.0000 mg | ORAL_TABLET | Freq: Once | ORAL | Status: DC | PRN

## 2021-01-28 MED ORDER — ACETAMINOPHEN 10 MG/ML IV SOLN
INTRAVENOUS | Status: DC | PRN
  Administered 2021-01-28: 1000 mg via INTRAVENOUS

## 2021-01-28 MED ORDER — MORPHINE SULFATE (PF) 2 MG/ML IV SOLN
0.5000 mg | INTRAVENOUS | Status: DC | PRN
  Administered 2021-01-28 – 2021-01-31 (×5): 1 mg via INTRAVENOUS
  Filled 2021-01-28 (×5): qty 1

## 2021-01-28 MED ORDER — BUPIVACAINE-EPINEPHRINE (PF) 0.25% -1:200000 IJ SOLN
INTRAMUSCULAR | Status: AC
  Filled 2021-01-28: qty 30

## 2021-01-28 MED ORDER — LEVOTHYROXINE SODIUM 88 MCG PO TABS
88.0000 ug | ORAL_TABLET | Freq: Every day | ORAL | Status: DC
  Administered 2021-01-29 – 2021-02-01 (×4): 88 ug via ORAL
  Filled 2021-01-28 (×5): qty 1

## 2021-01-28 MED ORDER — ONDANSETRON HCL 4 MG/2ML IJ SOLN
INTRAMUSCULAR | Status: AC
  Filled 2021-01-28: qty 2

## 2021-01-28 MED ORDER — FUROSEMIDE 20 MG PO TABS
20.0000 mg | ORAL_TABLET | Freq: Every day | ORAL | Status: DC
  Administered 2021-01-28 – 2021-02-01 (×4): 20 mg via ORAL
  Filled 2021-01-28 (×5): qty 1

## 2021-01-28 MED ORDER — SURGIPHOR WOUND IRRIGATION SYSTEM - OPTIME
TOPICAL | Status: DC | PRN
  Administered 2021-01-28: 400 mL via TOPICAL

## 2021-01-28 MED ORDER — ACETAMINOPHEN 10 MG/ML IV SOLN
INTRAVENOUS | Status: AC
  Filled 2021-01-28: qty 200

## 2021-01-28 MED ORDER — KETOROLAC TROMETHAMINE 15 MG/ML IJ SOLN
15.0000 mg | Freq: Four times a day (QID) | INTRAMUSCULAR | Status: AC
  Administered 2021-01-28 – 2021-01-29 (×3): 15 mg via INTRAVENOUS
  Filled 2021-01-28 (×3): qty 1

## 2021-01-28 MED ORDER — BISACODYL 10 MG RE SUPP
10.0000 mg | Freq: Every day | RECTAL | Status: DC | PRN
  Filled 2021-01-28: qty 1

## 2021-01-28 MED ORDER — AMLODIPINE BESYLATE 10 MG PO TABS
10.0000 mg | ORAL_TABLET | Freq: Every day | ORAL | Status: DC
  Administered 2021-01-29 – 2021-01-31 (×3): 10 mg via ORAL
  Filled 2021-01-28 (×4): qty 1

## 2021-01-28 MED ORDER — PROPOFOL 1000 MG/100ML IV EMUL
INTRAVENOUS | Status: AC
  Filled 2021-01-28: qty 100

## 2021-01-28 MED ORDER — PANTOPRAZOLE SODIUM 40 MG PO TBEC
40.0000 mg | DELAYED_RELEASE_TABLET | Freq: Every day | ORAL | Status: DC
  Administered 2021-01-28 – 2021-02-01 (×4): 40 mg via ORAL
  Filled 2021-01-28 (×4): qty 1

## 2021-01-28 MED ORDER — METOCLOPRAMIDE HCL 10 MG PO TABS
5.0000 mg | ORAL_TABLET | Freq: Three times a day (TID) | ORAL | Status: DC | PRN
Start: 2021-01-28 — End: 2021-02-01
  Administered 2021-01-29: 10 mg via ORAL
  Filled 2021-01-28: qty 1

## 2021-01-28 MED ORDER — MAGNESIUM HYDROXIDE 400 MG/5ML PO SUSP
30.0000 mL | Freq: Every day | ORAL | Status: DC | PRN
  Administered 2021-01-30 – 2021-02-01 (×2): 30 mL via ORAL
  Filled 2021-01-28 (×2): qty 30

## 2021-01-28 MED ORDER — CEFAZOLIN SODIUM-DEXTROSE 2-4 GM/100ML-% IV SOLN
2.0000 g | INTRAVENOUS | Status: AC
  Administered 2021-01-28: 2 g via INTRAVENOUS

## 2021-01-28 SURGICAL SUPPLY — 67 items
BLADE SAGITTAL 25.0X1.19X90 (BLADE) ×2 IMPLANT
BLADE SAW 90X13X1.19 OSCILLAT (BLADE) ×2 IMPLANT
BNDG ELASTIC 6X5.8 VLCR STR LF (GAUZE/BANDAGES/DRESSINGS) ×2 IMPLANT
CANISTER SUCT 1200ML W/VALVE (MISCELLANEOUS) IMPLANT
CANISTER WOUND CARE 500ML ATS (WOUND CARE) ×2 IMPLANT
CEMENT FEMORAL COMP SZ3 RT (Femur) ×2 IMPLANT
CEMENT HV SMART SET (Cement) ×4 IMPLANT
CHLORAPREP W/TINT 26 (MISCELLANEOUS) ×4 IMPLANT
COOLER POLAR GLACIER W/PUMP (MISCELLANEOUS) ×2 IMPLANT
COVER WAND RF STERILE (DRAPES) ×2 IMPLANT
CUFF TOURN SGL QUICK 24 (TOURNIQUET CUFF)
CUFF TOURN SGL QUICK 30 (TOURNIQUET CUFF) ×1
CUFF TRNQT CYL 24X4X16.5-23 (TOURNIQUET CUFF) IMPLANT
CUFF TRNQT CYL 30X4X21-28X (TOURNIQUET CUFF) ×1 IMPLANT
DRAPE 3/4 80X56 (DRAPES) ×4 IMPLANT
DRSG MEPILEX SACRM 8.7X9.8 (GAUZE/BANDAGES/DRESSINGS) ×2 IMPLANT
ELECT CAUTERY BLADE 6.4 (BLADE) ×2 IMPLANT
ELECT REM PT RETURN 9FT ADLT (ELECTROSURGICAL) ×2
ELECTRODE REM PT RTRN 9FT ADLT (ELECTROSURGICAL) ×1 IMPLANT
GAUZE SPONGE 4X4 12PLY STRL (GAUZE/BANDAGES/DRESSINGS) ×2 IMPLANT
GAUZE XEROFORM 1X8 LF (GAUZE/BANDAGES/DRESSINGS) ×2 IMPLANT
GLOVE SURG ORTHO LTX SZ8 (GLOVE) ×2 IMPLANT
GLOVE SURG SYN 9.0  PF PI (GLOVE) ×1
GLOVE SURG SYN 9.0 PF PI (GLOVE) ×1 IMPLANT
GLOVE SURG UNDER LTX SZ8 (GLOVE) ×2 IMPLANT
GLOVE SURG UNDER POLY LF SZ9 (GLOVE) ×2 IMPLANT
GOWN SRG 2XL LVL 4 RGLN SLV (GOWNS) ×1 IMPLANT
GOWN STRL NON-REIN 2XL LVL4 (GOWNS) ×1
GOWN STRL REUS W/ TWL LRG LVL3 (GOWN DISPOSABLE) ×1 IMPLANT
GOWN STRL REUS W/ TWL XL LVL3 (GOWN DISPOSABLE) ×1 IMPLANT
GOWN STRL REUS W/TWL LRG LVL3 (GOWN DISPOSABLE) ×1
GOWN STRL REUS W/TWL XL LVL3 (GOWN DISPOSABLE) ×1
HOLDER FOLEY CATH W/STRAP (MISCELLANEOUS) ×2 IMPLANT
HOOD PEEL AWAY FLYTE STAYCOOL (MISCELLANEOUS) ×4 IMPLANT
INSERT TIBAIL SZ3 HT12 FLEX (Insert) ×2 IMPLANT
IRRIGATION SURGIPHOR STRL (IV SOLUTION) ×2 IMPLANT
IV NS IRRIG 3000ML ARTHROMATIC (IV SOLUTION) ×2 IMPLANT
KIT PREVENA INCISION MGT20CM45 (CANNISTER) ×2 IMPLANT
KIT TURNOVER KIT A (KITS) ×2 IMPLANT
MANIFOLD NEPTUNE II (INSTRUMENTS) ×2 IMPLANT
NDL SAFETY ECLIPSE 18X1.5 (NEEDLE) ×1 IMPLANT
NEEDLE HYPO 18GX1.5 SHARP (NEEDLE) ×1
NEEDLE SPNL 18GX3.5 QUINCKE PK (NEEDLE) ×2 IMPLANT
NEEDLE SPNL 20GX3.5 QUINCKE YW (NEEDLE) ×2 IMPLANT
NS IRRIG 1000ML POUR BTL (IV SOLUTION) ×2 IMPLANT
PACK TOTAL KNEE (MISCELLANEOUS) ×2 IMPLANT
PAD WRAPON POLAR KNEE (MISCELLANEOUS) ×1 IMPLANT
PATELLA RESURFACING MEDACTA 02 (Bone Implant) ×2 IMPLANT
PENCIL SMOKE EVACUATOR COATED (MISCELLANEOUS) ×2 IMPLANT
PULSAVAC PLUS IRRIG FAN TIP (DISPOSABLE) ×2
SCALPEL PROTECTED #10 DISP (BLADE) ×4 IMPLANT
STAPLER SKIN PROX 35W (STAPLE) ×2 IMPLANT
STEM EXTENSION 11MMX30MM (Stem) ×2 IMPLANT
SUCTION FRAZIER HANDLE 10FR (MISCELLANEOUS) ×1
SUCTION TUBE FRAZIER 10FR DISP (MISCELLANEOUS) ×1 IMPLANT
SUT DVC 2 QUILL PDO  T11 36X36 (SUTURE) ×1
SUT DVC 2 QUILL PDO T11 36X36 (SUTURE) ×1 IMPLANT
SUT ETHIBOND 2 V 37 (SUTURE) IMPLANT
SUT V-LOC 90 ABS DVC 3-0 CL (SUTURE) ×2 IMPLANT
SYR 20ML LL LF (SYRINGE) ×2 IMPLANT
SYR 50ML LL SCALE MARK (SYRINGE) ×4 IMPLANT
TIP FAN IRRIG PULSAVAC PLUS (DISPOSABLE) ×1 IMPLANT
TOWEL OR 17X26 4PK STRL BLUE (TOWEL DISPOSABLE) ×2 IMPLANT
TOWER CARTRIDGE SMART MIX (DISPOSABLE) ×2 IMPLANT
TRAY FOLEY MTR SLVR 16FR STAT (SET/KITS/TRAYS/PACK) ×2 IMPLANT
TRAY TIB FX RT SZ 3 (Joint) ×2 IMPLANT
WRAPON POLAR PAD KNEE (MISCELLANEOUS) ×2

## 2021-01-28 NOTE — Op Note (Signed)
01/28/2021  12:44 PM  PATIENT:  Alexandria Moore   MRN: [id number]  PRE-OPERATIVE DIAGNOSIS:  Primary localized osteoarthritis of right knee   POST-OPERATIVE DIAGNOSIS:  Same   PROCEDURE:  Procedure(s): Right TOTAL KNEE ARTHROPLASTY   SURGEON: Leitha Schuller, MD   ASSISTANTS: Cranston Neighbor, PA-C   ANESTHESIA:   spinal   EBL: 100 cc     BLOOD ADMINISTERED:none   DRAINS: Incisional wound VAC    LOCAL MEDICATIONS USED:  MARCAINE    and OTHER Exparel   SPECIMEN:  No Specimen   DISPOSITION OF SPECIMEN:  N/A   COUNTS:  YES   TOURNIQUET:   75 minutes at 300 mm Hg   IMPLANTS: Medacta  GMK sphere system with  3 right femur, 3 right tibia with short stem and  12 mm insert.  Size  2 patella, all components cemented.   DICTATION: Reubin Milan Dictation   patient was brought to the operating room and spinal anesthesia was obtained.  After prepping and draping the  right leg in sterile fashion, and after patient identification and timeout procedures were completed, tourniquet was raised  and midline skin incision was made followed by medial parapatellar arthrotomy with  severe medial compartment osteoarthritis, severe patellofemoral arthritis and  severe lateral compartment arthritis, partial synovectomy was also carried out.   The ACL and PCL and fat pad were excised along with anterior horns of the meniscus. The proximal tibia cutting guide from  the Medacta extra medullary system was applied and the proximal tibia cut carried out.  The distal femoral cut was carried out in a similar fashion using the Medacta intramedullary guide     The  3 femoral cutting guide applied with anterior posterior and chamfer cuts made.  The posterior horns of the menisci were removed at this point.   Injection of the above medication was carried out after the femoral and tibial cuts were carried out.  The  3 baseplate trial was placed pinned into position and proximal tibial preparation carried out with drilling  hand reaming and the keel punch followed by placement of the  3 femur and sizing the tibial insert size   12 millimeter gave the best fit with stability and full extension.  The distal femoral drill holes were made in the notch cut for the trochlear groove was then carried out with trials were then removed the patella was cut using the patellar cutting guide and it sized to a size 2 after drill holes have been made  The knee was irrigated with pulsatile lavage and the bony surfaces dried the tibial component was cemented into place first.  Excess cement was removed and the polyethylene insert placed with a torque screw placed with a torque screwdriver tightened.  The distal femoral component was placed and the knee was held in extension as the patellar button was clamped into place.  After the cement was set, excess cement was removed and the knee was again irrigated thoroughly thoroughly irrigated.  The tourniquet was let down and hemostasis checked with electrocautery. The arthrotomy was repaired with a heavy Quill suture,  followed by 3-0 V lock subcuticular closure, skin staples followed by incisional wound VAC and Polar Care.Marland Kitchen   PLAN OF CARE: Admit for overnight observation   PATIENT DISPOSITION:  PACU - hemodynamically stable.

## 2021-01-28 NOTE — Discharge Summary (Addendum)
Physician Discharge Summary  Patient ID: Alexandria Moore MRN: [id number] DOB/AGE: [birth year] 60 y.o.  Admit date: 01/28/2021 Discharge date: 02/01/21 Admission Diagnoses:  S/P TKR (total knee replacement) using cement, right [Z96.651] Total knee replacement status, right [Z96.651]   Discharge Diagnoses: Patient Active Problem List   Diagnosis Date Noted  . Total knee replacement status, right 01/31/2021  . S/P TKR (total knee replacement) using cement, right 01/28/2021  . Cervical myelopathy (HCC) 01/17/2020  . Essential hypertension 12/05/2019  . Pneumonia due to COVID-19 virus 12/05/2019  . Acute respiratory failure with hypoxia (HCC) 12/05/2019  . Congestive heart failure (HCC) 12/05/2019  . Thrombocytopenia (HCC) 12/05/2019  . Elevated troponin level 12/05/2019  . Primary osteoarthritis of both knees 04/25/2019  . Primary osteoarthritis involving multiple joints 04/25/2019  . Bilateral edema of lower extremity 02/15/2019  . Status post total hip replacement, left 05/12/2018  . BMI 40.0-44.9, adult (HCC) 04/20/2018    Past Medical History:  Diagnosis Date  . Anxiety   . Arthritis    RA  . Borderline diabetes   . CHF (congestive heart failure) (HCC)   . COVID-19 11/2019  . Dyspnea    OFF AND ON SINCE COVID 2021  . GERD (gastroesophageal reflux disease)   . Goiter   . Heart murmur    not being followed or treated. has lost weight with improvement of murmur  . History of blood transfusion    unsure of when she had transfusion but states she had a terrible reaction and needed medication  . Hypertension   . Hyperthyroidism   . Hypothyroidism    not on meds at this time  . Medical history non-contributory   . Pneumonia 11/2019  . Sleep apnea    does not use cpap since weight loss     Transfusion: none   Consultants (if any):   Discharged Condition: Improved  Hospital Course: Alexandria Moore is an 60 y.o. female who was admitted 01/28/2021 with a  diagnosis of right knee osteoathritis and went to the operating room on 01/28/2021 and underwent the above named procedures.    Surgeries: Procedure(s): TOTAL KNEE ARTHROPLASTY on 01/28/2021 Patient tolerated the surgery well. Taken to PACU where she was stabilized and then transferred to the orthopedic floor.  Started on Lovenox 30 mg q 12 hrs. Foot pumps applied bilaterally at 80 mm. Heels elevated on bed with rolled towels. No evidence of DVT. Negative Homan. Physical therapy started on day #1 for gait training and transfer. OT started day #1 for ADL and assisted devices.  Patient's foley was d/c on day #1. Patient's IV was d/c on day #2.  On post op day #3 patient was stable and ready for discharge to home with home health therapy.  Pain medications were adjusted on POD3 with good results, patient discharged home with fentanyl patch in addition to oxycodone for breakthrough pain.  Implants: Medacta GMK sphere system with 3 rightfemur, 3 righttibia with short stem and 50mm insert. Size 2patella, all components cemented  She was given perioperative antibiotics:  Anti-infectives (From admission, onward)   Start     Dose/Rate Route Frequency Ordered Stop   01/28/21 1700  ceFAZolin (ANCEF) IVPB 2g/100 mL premix        2 g 200 mL/hr over 30 Minutes Intravenous Every 6 hours 01/28/21 1308 01/29/21 0522   01/28/21 0851  ceFAZolin (ANCEF) 2-4 GM/100ML-% IVPB       Note to Pharmacy: Kerman Passey, Cryst: cabinet override  01/28/21 0851 01/28/21 1047   01/28/21 0745  ceFAZolin (ANCEF) IVPB 2g/100 mL premix        2 g 200 mL/hr over 30 Minutes Intravenous On call to O.R. 01/28/21 2130 01/28/21 1044    .  She was given sequential compression devices, early ambulation, and Lovenox for DVT prophylaxis.  She benefited maximally from the hospital stay and there were no complications.    Recent vital signs:  Vitals:   02/01/21 0508 02/01/21 0821  BP: (!) 113/57 (!) 94/46  Pulse:  82 88  Resp: 17 16  Temp: 99 F (37.2 C) 99.2 F (37.3 C)  SpO2: 90% 100%    Recent laboratory studies:  Lab Results  Component Value Date   HGB 11.1 (L) 01/29/2021   HGB 11.8 (L) 01/28/2021   HGB 12.4 01/16/2021   Lab Results  Component Value Date   WBC 8.7 01/29/2021   PLT 184 01/29/2021   Lab Results  Component Value Date   INR 0.94 05/04/2018   Lab Results  Component Value Date   NA 137 01/29/2021   K 4.3 01/29/2021   CL 104 01/29/2021   CO2 25 01/29/2021   BUN 13 01/29/2021   CREATININE 0.69 01/29/2021   GLUCOSE 147 (H) 01/29/2021    Discharge Medications:   Allergies as of 02/01/2021      Reactions   Hydromorphone Anaphylaxis, Shortness Of Breath   Lisinopril Anaphylaxis, Nausea And Vomiting, Swelling, Other (See Comments)   LIP SWELLING   Meloxicam Anaphylaxis, Other (See Comments)   Weakness in Legs Other reaction(s): Other (See Comments) Weakness in Legs Weakness in Legs   Adhesive [tape] Other (See Comments)   Irritates skin. (band-aids) Paper tape is okay      Medication List    STOP taking these medications   acetaminophen 500 MG tablet Commonly known as: TYLENOL   ibuprofen 800 MG tablet Commonly known as: ADVIL     TAKE these medications   amLODipine 10 MG tablet Commonly known as: NORVASC Take 10 mg by mouth in the morning.   cholecalciferol 25 MCG (1000 UNIT) tablet Commonly known as: VITAMIN D Take 2,000 Units by mouth in the morning.   docusate sodium 100 MG capsule Commonly known as: COLACE Take 1 capsule (100 mg total) by mouth 2 (two) times daily.   enoxaparin 40 MG/0.4ML injection Commonly known as: Lovenox Inject 0.4 mLs (40 mg total) into the skin daily for 14 days.   ergocalciferol 1.25 MG (50000 UT) capsule Commonly known as: VITAMIN D2 Take 50,000 Units by mouth once a week.   fentaNYL 12 MCG/HR Commonly known as: DURAGESIC Place 1 patch onto the skin every 3 (three) days.   folic acid 1 MG tablet Commonly  known as: FOLVITE Take 1 mg by mouth in the morning.   furosemide 20 MG tablet Commonly known as: LASIX Take 20 mg by mouth as needed.   gabapentin 300 MG capsule Commonly known as: NEURONTIN Take 300 mg by mouth 3 (three) times daily as needed (nerve pain/nerve damage).   Lidoderm 5 % Generic drug: lidocaine Place 1 patch onto the skin daily as needed (pain).   losartan 100 MG tablet Commonly known as: COZAAR Take 100 mg by mouth in the morning.   methocarbamol 500 MG tablet Commonly known as: ROBAXIN Take 1 tablet (500 mg total) by mouth every 6 (six) hours as needed for muscle spasms.   methotrexate 2.5 MG tablet Commonly known as: RHEUMATREX Take 15 mg by mouth  every Sunday.   OMEGA-3 PO Take 1 capsule by mouth 2 (two) times a week.   ondansetron 8 MG tablet Commonly known as: Zofran Take 1 tablet (8 mg total) by mouth every 8 (eight) hours as needed for nausea or vomiting.   oxyCODONE 5 MG immediate release tablet Commonly known as: Roxicodone Take 1-2 tablets (5-10 mg total) by mouth every 4 (four) hours as needed for breakthrough pain.   Synthroid 88 MCG tablet Generic drug: levothyroxine Take 88 mcg by mouth daily before breakfast.            Durable Medical Equipment  (From admission, onward)         Start     Ordered   01/28/21 1309  DME Walker rolling  Once       Question Answer Comment  Walker: With 5 Inch Wheels   Patient needs a walker to treat with the following condition S/P TKR (total knee replacement) using cement, right      01/28/21 1308   01/28/21 1309  DME 3 n 1  Once        04 /19/22 1308   01/28/21 1309  DME Bedside commode  Once       Question:  Patient needs a bedside commode to treat with the following condition  Answer:  S/P TKR (total knee replacement) using cement, right   01/28/21 1308          Diagnostic Studies: DG Knee 1-2 Views Right  Result Date: 01/28/2021 CLINICAL DATA:  Postop pain. EXAM: RIGHT KNEE - 1-2  VIEW COMPARISON:  No recent. FINDINGS: Total right knee replacement. Hardware intact. Anatomic alignment. No acute bony abnormality. IMPRESSION: Total right knee replacement.  Anatomic alignment. Electronically Signed   By: Maisie Fus  Register   On: 01/28/2021 13:23   Disposition: Discharge home with HHPT today.   Follow-up Information    Evon Slack, PA-C Follow up in 2 week(s).   Specialties: Orthopedic Surgery, Emergency Medicine Contact information: [address] [place] [address] [phone]              Signed: Meriel Pica PA-C 02/01/2021, 9:54 AM

## 2021-01-28 NOTE — Discharge Instructions (Signed)

## 2021-01-28 NOTE — Evaluation (Signed)
Physical Therapy Evaluation Patient Details Name: Alexandria Moore MRN: [id number] DOB: 01-01-1961 Today's Date: 01/28/2021   History of Present Illness  Pt is a 60 yo F diagnosed with osteoarthritis of right knee and is s/p elective R TKA.  PMH includes: SOB, HTN, anxiety, CHF, L THA, and cervical fusion.    Clinical Impression  Pt was pleasant and motivated to participate during the session but was limited by pain.  Pt found in room moaning in distress despite significant efforts at pain control with medication from nursing.  Pt was agreeable to work with PT to her credit and overall performed much better than expected based on 10/10 R knee pain at rest.  Pt required only min A to manage her RLE during bed mobility tasks and min A to come to standing from an elevated EOB.  Once in standing, however, the pt was highly limited by her R knee pain.  She was only able to take several very small, effortful, shuffling steps at the EOB and had great difficulty advancing her LLE secondary to corresponding increase in WB and pain through the RLE when doing so.  Despite her pain the pt really did put forth very good effort during the session and will likely make good progress towards goals while in acute care once pain is less of an issue.  That being said the pt would not be safe to attempt to return to her prior living situation in her current state and would benefit from PT services in a SNF setting if she does not make expected progress in acute care.         Follow Up Recommendations SNF;Supervision for mobility/OOB    Equipment Recommendations  None recommended by PT    Recommendations for Other Services       Precautions / Restrictions Precautions Precautions: Fall Restrictions Weight Bearing Restrictions: Yes RLE Weight Bearing: Weight bearing as tolerated      Mobility  Bed Mobility Overal bed mobility: Needs Assistance Bed Mobility: Supine to Sit;Sit to Supine     Supine to sit:  Supervision Sit to supine: Min assist   General bed mobility comments: Extra time and effort and use of UE to assist RLE out of bed during sup to sit, min A to manage the RLE during sit to sup    Transfers Overall transfer level: Needs assistance Equipment used: Rolling walker (2 wheeled) Transfers: Sit to/from Stand Sit to Stand: From elevated surface;Min assist         General transfer comment: Mod verbal and tactile cues for sequencing  Ambulation/Gait Ambulation/Gait assistance: Min guard Gait Distance (Feet): 1 Feet Assistive device: Rolling walker (2 wheeled) Gait Pattern/deviations: Step-to pattern;Shuffle;Decreased stance time - right Gait velocity: decreased   General Gait Details: Pt only able to take several very small, effortful shuffling steps at the EOB with difficulty clearing the L foot from the floor secondary to pain in the RLE with WB  Stairs            Wheelchair Mobility    Modified Rankin (Stroke Patients Only)       Balance Overall balance assessment: Needs assistance   Sitting balance-Leahy Scale: Good     Standing balance support: During functional activity;Bilateral upper extremity supported Standing balance-Leahy Scale: Fair Standing balance comment: Mod lean on the RW for support  Pertinent Vitals/Pain Pain Assessment: 0-10 Pain Score: 10-Worst pain ever Pain Location: R knee Pain Descriptors / Indicators: Aching;Sore Pain Intervention(s): Premedicated before session;Monitored during session;Repositioned    Home Living Family/patient expects to be discharged to:: Private residence Living Arrangements: Spouse/significant other Available Help at Discharge: Family;Available 24 hours/day Type of Home: Mobile home Home Access: Stairs to enter Entrance Stairs-Rails: Right;Left;Can reach both Entrance Stairs-Number of Steps: 4 Home Layout: One level Home Equipment: Bedside commode;Walker - 2  wheels      Prior Function Level of Independence: Independent with assistive device(s)         Comments: Mod Ind amb with a SPC, no fall history, Ind with ADLs, community ambulator     Hand Dominance   Dominant Hand: Right    Extremity/Trunk Assessment   Upper Extremity Assessment Upper Extremity Assessment: Overall WFL for tasks assessed    Lower Extremity Assessment Lower Extremity Assessment: Generalized weakness;RLE deficits/detail RLE Deficits / Details: BLE ankle AROM, strength, and sensation to light touch grossly WNL; RLE hip flex <3/5 likely limited by pain RLE: Unable to fully assess due to pain RLE Sensation: WNL       Communication   Communication: No difficulties  Cognition Arousal/Alertness: Awake/alert Behavior During Therapy: WFL for tasks assessed/performed Overall Cognitive Status: Within Functional Limits for tasks assessed                                        General Comments      Exercises Total Joint Exercises Ankle Circles/Pumps: AROM;Strengthening;Both;10 reps Quad Sets: AROM;Strengthening;Right;5 reps;10 reps Long Arc Quad: AROM;Strengthening;Right;5 reps;10 reps Knee Flexion: AROM;Strengthening;Right;5 reps;10 reps Goniometric ROM: R knee AROM: 11-81 deg Marching in Standing: AROM;Strengthening;Right;5 reps;Standing (Pt only able to slightly lift L foot from the floor 1-2 times) Other Exercises Other Exercises: HEP education for RLE QS and LAQs x 10 each 5-6x/day Other Exercises: Positioning education for RLE knee ext PROM and pressure relief for R heel   Assessment/Plan    PT Assessment Patient needs continued PT services  PT Problem List Decreased strength;Decreased range of motion;Decreased activity tolerance;Decreased balance;Decreased mobility;Decreased knowledge of use of DME;Pain       PT Treatment Interventions DME instruction;Gait training;Stair training;Functional mobility training;Therapeutic  activities;Therapeutic exercise;Balance training;Patient/family education    PT Goals (Current goals can be found in the Care Plan section)  Acute Rehab PT Goals Patient Stated Goal: To walk better without pain PT Goal Formulation: With patient Time For Goal Achievement: 02/10/21 Potential to Achieve Goals: Good    Frequency BID   Barriers to discharge Inaccessible home environment      Co-evaluation               AM-PAC PT "6 Clicks" Mobility  Outcome Measure Help needed turning from your back to your side while in a flat bed without using bedrails?: A Little Help needed moving from lying on your back to sitting on the side of a flat bed without using bedrails?: A Little Help needed moving to and from a bed to a chair (including a wheelchair)?: A Little Help needed standing up from a chair using your arms (e.g., wheelchair or bedside chair)?: A Little Help needed to walk in hospital room?: Total Help needed climbing 3-5 steps with a railing? : Total 6 Click Score: 14    End of Session Equipment Utilized During Treatment: Gait belt Activity Tolerance: Patient limited by pain  Patient left: in bed;with call bell/phone within reach;with bed alarm set;with SCD's reapplied;Other (comment) (Polar care to R knee) Nurse Communication: Mobility status PT Visit Diagnosis: Muscle weakness (generalized) (M62.81);Other abnormalities of gait and mobility (R26.89);Pain Pain - Right/Left: Right Pain - part of body: Knee    Time: 3893-7342 PT Time Calculation (min) (ACUTE ONLY): 42 min   Charges:   PT Evaluation $PT Eval Moderate Complexity: 1 Mod PT Treatments $Therapeutic Exercise: 8-22 mins        D. Elly Modena PT, DPT 01/28/21, 5:27 PM

## 2021-01-28 NOTE — TOC Initial Note (Signed)
Transition of Care [facility]) - Initial/Assessment Note    Patient Details  Name: Alexandria Moore MRN: [id number] Date of Birth: 06-08-1961  [facility] CM/SW Contact:    Su Hilt, RN Phone Number: 01/28/2021, 4:17 PM  Clinical Narrative:          Met with the patient and her daughter in the room, the patient has help at home with her boyfrend, she has a Environmental consultant at home and her daughter will check to see if it has wheels or not, she has a 3 in 1 and will try to locate it since it has been less than 5 years since she got it,     She is set up with [facility], will continue to monitor for additional needs           Patient Goals and CMS Choice        Expected Discharge Plan and Services                                                Prior Living Arrangements/Services                       Activities of Daily Living Home Assistive Devices/Equipment: None ADL Screening (condition at time of admission) Patient's cognitive ability adequate to safely complete daily activities?: Yes Is the patient deaf or have difficulty hearing?: No Does the patient have difficulty seeing, even when wearing glasses/contacts?: No Does the patient have difficulty concentrating, remembering, or making decisions?: No Patient able to express need for assistance with ADLs?: Yes Does the patient have difficulty dressing or bathing?: No Independently performs ADLs?: Yes (appropriate for developmental age) Does the patient have difficulty walking or climbing stairs?: No Weakness of Legs: None Weakness of Arms/Hands: None  Permission Sought/Granted                  Emotional Assessment              Admission diagnosis:  S/P TKR (total knee replacement) using cement, right [Z96.651] Patient Active Problem List   Diagnosis Date Noted  . S/P TKR (total knee replacement) using cement, right 01/28/2021  . Cervical myelopathy ([place]) 01/17/2020  .  Essential hypertension 12/05/2019  . Pneumonia due to COVID-19 virus 12/05/2019  . Acute respiratory failure with hypoxia ([place]) 12/05/2019  . Congestive heart failure ([place]) 12/05/2019  . Thrombocytopenia ([place]) 12/05/2019  . Elevated troponin level 12/05/2019  . Primary osteoarthritis of both knees 04/25/2019  . Primary osteoarthritis involving multiple joints 04/25/2019  . Bilateral edema of lower extremity 02/15/2019  . Status post total hip replacement, left 05/12/2018  . BMI 40.0-44.9, adult ([place]) 04/20/2018   PCP:  Freddy Finner, NP Pharmacy:   [facility] [address]), [place] [address] [place]) [place] [address] Phone: [phone] Fax: [place] [place] [address] Phone: [phone] Fax: [phone]     Social Determinants of Health (SDOH) Interventions    Readmission Risk Interventions No flowsheet data found.

## 2021-01-28 NOTE — H&P (Signed)
Chief Complaint  Patient presents with  . Pre-op Exam  scheduled for TKA 01/28/21 by Dr. Rosita Kea    History of the Present Illness: Alexandria Moore is a 60 y.o. female here today for history and physical for right total knee arthroplasty with Dr. Kennedy Bucker on 01/28/2021. Patient has experienced years of progressive right knee pain. The patient states she has extreme right knee stiffness first thing in the morning. She experiences a cramping-type sensation in her right knee. She has no pain with twisting or pivoting. Patient has moderate to severe pain with standing and walking long distances, 1 block or more. The patient has pain with going up and down stairs. She has difficulty raising from a seated position, rating it a 3 on a 0-4 scale. The patient states she tried to obtain a viscosupplementation injection to her right knee, but could not due to difficulty with her insurance. She has had 2 or 3 injections to the right knee, which only provide her with short-term relief. She has tried tramadol, turmeric and Celebrex with no relief. She is also tried a knee brace with no relief.  The patient has a history of a herniated disc. She has rheumatoid arthritis.   The patient is employed as a [employer].   I have reviewed past medical, surgical, social and family history, and allergies as documented in the EMR.  Past Medical History: Past Medical History:  Diagnosis Date  . Borderline diabetes  . GERD (gastroesophageal reflux disease)  . Goiter  . Hx of cardiac murmur  . Hyperlipidemia  . Hypertension  . Hypothyroidism (acquired), unspecified  . Sleep apnea   Past Surgical History: Past Surgical History:  Procedure Laterality Date  . C4-5 anterior cervical discectomy and fusion 01/18/2020  by Dr. Venetia Night at [facility], [place]  . CAPSULE ENDOSCOPY 01/04/2008  . CESAREAN SECTION 1982  . COLONOSCOPY 01/03/2008, 01/07/2005  [facility] (Father): CBF 12/2017; Recall Ltr mailed 11/25/2017 (dh)   . EGD 01/03/2008  . Left Total Hip Left 05/12/2018  Kennedy Bucker, MD   Past Family History: Family History  Problem Relation Age of Onset  . High blood pressure (Hypertension) Mother  . Hyperlipidemia (Elevated cholesterol) Mother  . Heart disease Mother  . Arthritis Mother  . High blood pressure (Hypertension) Father  . Diabetes Father  . Colon cancer Father  . Myocardial Infarction (Heart attack) Sister   Medications: Current Outpatient Medications Ordered in Epic  Medication Sig Dispense Refill  . acetaminophen (TYLENOL ORAL) Take by mouth as needed  . amLODIPine (NORVASC) 10 MG tablet Take 10 mg by mouth once daily.  . ergocalciferol, vitamin D2, 1,250 mcg (50,000 unit) capsule Take 50,000 Units by mouth once a week  . folic acid (FOLVITE) 1 MG tablet Take 1 mg by mouth once daily  . FUROsemide (LASIX) 20 MG tablet  . gabapentin (NEURONTIN) 100 MG capsule Please take 1 tablet in the morning, 1 tablet at noon, 3 tablets at bedtime. (Patient not taking: Reported on 10/03/2020 ) 30 capsule 0  . gabapentin (NEURONTIN) 300 MG capsule Take 1 capsule by mouth 3 (three) times a day  . levothyroxine (SYNTHROID) 88 MCG tablet Take 88 mcg by mouth once daily Take on an empty stomach with a glass of water at least 30-60 minutes before breakfast.  . losartan (COZAAR) 100 MG tablet Take 100 mg by mouth once daily.  . methotrexate (RHEUMATREX) 2.5 MG tablet Take 8 tablets (20 mg total) by mouth every 7 (seven) days 32  tablet 2  . omeprazole (PRILOSEC) 20 MG DR capsule Take 1 capsule by mouth once daily as needed 1  . tiZANidine (ZANAFLEX) 4 MG tablet Take 1 tablet by mouth as directed  . TURMERIC ORAL Take by mouth once daily   No current Epic-ordered facility-administered medications on file.   Allergies: Allergies  Allergen Reactions  . Hydromorphone Shortness Of Breath and Anaphylaxis  . Lisinopril Anaphylaxis, Nausea And Vomiting, Nausea, Other (See Comments) and Abdominal Pain   Other reaction(s): Abdominal Pain  . Meloxicam Anaphylaxis and Other (See Comments)  Weakness in Legs  . Adhesive Tape-Silicones Other (See Comments)  Irritates skin. Paper tape is okay    Body mass index is 42.51 kg/m.  Review of Systems: A comprehensive 14 point ROS was performed, reviewed, and the pertinent orthopaedic findings are documented in the HPI.  Vitals:  01/13/21 1041  BP: 122/82    General Physical Examination:   General:  Well developed, well nourished, no apparent distress, normal affect, antalgic gait with no assistive devices  HEENT: Head normocephalic, atraumatic, PERRL.   Abdomen: Soft, non tender, non distended, Bowel sounds present.  Heart: Examination of the heart reveals regular, rate, and rhythm. There is no murmur noted on ascultation. There is a normal apical pulse.  Lungs: Lungs are clear to auscultation. There is no wheeze, rhonchi, or crackles. There is normal expansion of bilateral chest walls.   Musculoskeletal Examination:  On exam, right knee has range of motion of 0-115 degrees with pain at the medial joint line. Right patellar crepitus. No pain with hip internal or external rotation  Radiographs: AP, lateral, standing, and sunrise x-rays of the right knee were reviewed by me today from 07/22/2020 AP view shows significant joint space narrowing, almost complete joint space loss on medial and lateral compartments with large osteophytes medially and laterally. Lateral view shows complete loss of joint space with some posterior osteophytes on the femur and significant patellofemoral changes. Sunrise view shows irregularities to the patella with defect to the lateral facet consistent with significant erosion.  X-ray Impression Advanced tricompartmental right knee osteoarthritis.  Assessment: ICD-10-CM  1. Primary osteoarthritis of right knee M17.11   Plan: 43. 60 year old female with advanced right knee osteoarthritis who has had years  of progressive right knee pain and debilitation. She is failed conservative treatment consisting of injections, medications and bracing. Risks, benefits, complications of a right total knee arthroplasty were discussed with the patient. Patient has agreed and consented procedure with Dr. Kennedy Bucker on 01/28/2021  Electronically signed by Patience Musca, PA at 01/13/2021 11:00 AM EDT Reviewed  H+P. No changes noted.

## 2021-01-28 NOTE — Anesthesia Preprocedure Evaluation (Signed)
Anesthesia Evaluation  Patient identified by MRN, date of birth, ID band Patient awake    Reviewed: Allergy & Precautions, H&P , NPO status , Patient's Chart, lab work & pertinent test results, reviewed documented beta blocker date and time   History of Anesthesia Complications Negative for: history of anesthetic complications  Airway Mallampati: II  TM Distance: >3 FB Neck ROM: full    Dental  (+) Teeth Intact   Pulmonary neg pulmonary ROS, shortness of breath and with exertion, sleep apnea , pneumonia, resolved, neg COPD, Patient abstained from smoking.Not current smoker, former smoker,  S/P COVID, some home O2 use w activity/DOE   Pulmonary exam normal breath sounds clear to auscultation       Cardiovascular Exercise Tolerance: Good METShypertension, (-) CAD, (-) Past MI and (-) CHF negative cardio ROS Normal cardiovascular exam(-) dysrhythmias (-) Valvular Problems/Murmurs Rhythm:Regular Rate:Normal  2/21 ECHO Left ventricular ejection fraction, by estimation, is 55 to 60%. The left ventricle has normal function. The left ventricle has no regional wall motion abnormalities. Left ventricular diastolic function could not be evaluated. 2. Right ventricular systolic function is normal. The right ventricular size is normal. There is normal pulmonary artery systolic pressure. 3. The mitral valve is normal in structure and function. No evidence of mitral valve regurgitation. 4. The aortic valve is grossly normal. Aortic valve regurgitation is not visualized. 5. Pulmonic valve regurgitation not assessed. 6. The inferior vena cava is normal in size with greater than 50% respiratory variability, suggesting right atrial pressure of 3 mmHg.   Neuro/Psych PSYCHIATRIC DISORDERS Anxiety negative neurological ROS     GI/Hepatic GERD  Controlled,(+)     (-) substance abuse  ,   Endo/Other  neg diabetesHypothyroidism Morbid obesity   Renal/GU negative Renal ROS     Musculoskeletal  (+) Arthritis ,   Abdominal   Peds  Hematology   Anesthesia Other Findings Past Medical History: No date: Arthritis No date: CHF (congestive heart failure) (HCC) No date: Dyspnea     Comment:  not so much now since weight loss No date: GERD (gastroesophageal reflux disease) No date: Heart murmur     Comment:  not being followed or treated. has lost weight with               improvement of murmur No date: History of blood transfusion     Comment:  unsure of when she had transfusion but states she had a               terrible reaction and needed medication No date: Hypertension No date: Hypothyroidism     Comment:  not on meds at this time No date: Sleep apnea     Comment:  does not use cpap since weight loss Past Surgical History: 1982: CESAREAN SECTION     Comment:  x 1 05/12/2018: TOTAL HIP ARTHROPLASTY; Left     Comment:  Procedure: TOTAL HIP ARTHROPLASTY ANTERIOR APPROACH;                Surgeon: Kennedy Bucker, MD;  Location: [facility] ORS;                Service: Orthopedics;  Laterality: Left; BMI    Body Mass Index: 43.43 kg/m     Reproductive/Obstetrics                             Anesthesia Physical  Anesthesia Plan  ASA: III  Anesthesia Plan:  General/Spinal   Post-op Pain Management:    Induction: Intravenous  PONV Risk Score and Plan: 3 and Ondansetron, Treatment may vary due to age or medical condition, Dexamethasone and Midazolam  Airway Management Planned: Natural Airway  Additional Equipment: None  Intra-op Plan:   Post-operative Plan: Extubation in OR  Informed Consent: I have reviewed the patients History and Physical, chart, labs and discussed the procedure including the risks, benefits and alternatives for the proposed anesthesia with the patient or authorized representative who has indicated his/her understanding and acceptance.     Dental Advisory Given  Plan  Discussed with: CRNA  Anesthesia Plan Comments: (Discussed R/B/A of neuraxial anesthesia technique with patient: - rare risks of spinal/epidural hematoma, nerve damage, infection - Risk of PDPH - Risk of nausea and vomiting - Risk of conversion to general anesthesia and its associated risks, including sore throat, damage to lips/teeth/oropharynx, and rare risks such as cardiac and respiratory events.  Patient voiced understanding.)        Anesthesia Quick Evaluation

## 2021-01-28 NOTE — Anesthesia Procedure Notes (Signed)
Spinal  Patient location during procedure: OR Start time: 01/28/2021 10:40 AM End time: 01/28/2021 10:48 AM Reason for block: surgical anesthesia Staffing Performed: anesthesiologist  Anesthesiologist: Arita Miss, MD Resident/CRNA: Hedda Slade, CRNA Preanesthetic Checklist Completed: patient identified, IV checked, site marked, risks and benefits discussed, surgical consent, monitors and equipment checked, pre-op evaluation and timeout performed Spinal Block Patient position: sitting Prep: ChloraPrep Patient monitoring: heart rate, continuous pulse ox, blood pressure and cardiac monitor Approach: midline Location: L3-4 Injection technique: single-shot Needle Needle type: Quincke  Needle gauge: 22 G Needle length: 9 cm Assessment Sensory level: T10 Events: CSF return Additional Notes Attempt by CRNA student and CRNA prior to succesful placement by MD. Negative paresthesia. Negative blood return. Positive free-flowing CSF. Expiration date of kit checked and confirmed. Patient tolerated procedure well, without complications.

## 2021-01-28 NOTE — Transfer of Care (Signed)
Immediate Anesthesia Transfer of Care Note  Patient: Alexandria Moore  Procedure(s) Performed: TOTAL KNEE ARTHROPLASTY (Right Knee)  Patient Location: PACU  Anesthesia Type:MAC and Spinal  Level of Consciousness: awake and patient cooperative  Airway & Oxygen Therapy: Patient Spontanous Breathing and Patient connected to face mask oxygen  Post-op Assessment: Report given to RN and Post -op Vital signs reviewed and stable  Post vital signs: Reviewed and stable  Last Vitals:  Vitals Value Taken Time  BP 101/65 01/28/21 1237  Temp    Pulse 69 01/28/21 1238  Resp 14 01/28/21 1241  SpO2 98 % 01/28/21 1238  Vitals shown include unvalidated device data.  Last Pain:  Vitals:   01/28/21 0859  TempSrc: Temporal  PainSc: 0-No pain      Patients Stated Pain Goal: 0 (25/42/70 6237)  Complications: No complications documented.

## 2021-01-29 ENCOUNTER — Encounter: Payer: Self-pay | Admitting: Orthopedic Surgery

## 2021-01-29 LAB — BASIC METABOLIC PANEL
Anion gap: 8 (ref 5–15)
BUN: 13 mg/dL (ref 8–23)
CO2: 25 mmol/L (ref 22–32)
Calcium: 8.4 mg/dL — ABNORMAL LOW (ref 8.9–10.3)
Chloride: 104 mmol/L (ref 98–111)
Creatinine, Ser: 0.69 mg/dL (ref 0.44–1.00)
GFR, Estimated: >60 mL/min (ref >60)
Glucose, Bld: 147 mg/dL — ABNORMAL HIGH (ref 70–99)
Potassium: 4.3 mmol/L (ref 3.5–5.1)
Sodium: 137 mmol/L (ref 135–145)

## 2021-01-29 LAB — CBC
HCT: 34 % — ABNORMAL LOW (ref 36.0–46.0)
Hemoglobin: 11.1 g/dL — ABNORMAL LOW (ref 12.0–15.0)
MCH: 30.5 pg (ref 26.0–34.0)
MCHC: 32.6 g/dL (ref 30.0–36.0)
MCV: 93.4 fL (ref 80.0–100.0)
Platelets: 184 10*3/uL (ref 150–400)
RBC: 3.64 MIL/uL — ABNORMAL LOW (ref 3.87–5.11)
RDW: 13.6 % (ref 11.5–15.5)
WBC: 8.7 10*3/uL (ref 4.0–10.5)
nRBC: 0 % (ref 0.0–0.2)

## 2021-01-29 MED ORDER — OXYCODONE HCL 5 MG PO TABS
10.0000 mg | ORAL_TABLET | ORAL | Status: DC | PRN
  Administered 2021-01-29 – 2021-02-01 (×10): 10 mg via ORAL
  Filled 2021-01-29 (×11): qty 2

## 2021-01-29 MED ORDER — OXYCODONE HCL ER 15 MG PO T12A
15.0000 mg | EXTENDED_RELEASE_TABLET | Freq: Two times a day (BID) | ORAL | Status: DC
  Administered 2021-01-29: 15 mg via ORAL
  Filled 2021-01-29: qty 1

## 2021-01-29 MED ORDER — FENTANYL 25 MCG/HR TD PT72: 1.0000 | MEDICATED_PATCH | TRANSDERMAL | Status: DC

## 2021-01-29 MED ORDER — CHLORHEXIDINE GLUCONATE CLOTH 2 % EX PADS: 6.0000 | MEDICATED_PAD | Freq: Every day | CUTANEOUS | Status: DC

## 2021-01-29 MED ORDER — FENTANYL 12 MCG/HR TD PT72
1.0000 | MEDICATED_PATCH | TRANSDERMAL | Status: DC
  Administered 2021-01-29: 1 via TRANSDERMAL
  Filled 2021-01-29: qty 1

## 2021-01-29 MED ORDER — SCOPOLAMINE 1 MG/3DAYS TD PT72
1.0000 | MEDICATED_PATCH | TRANSDERMAL | Status: DC
  Administered 2021-01-29: 1.5 mg via TRANSDERMAL
  Filled 2021-01-29 (×2): qty 1

## 2021-01-29 NOTE — Progress Notes (Signed)
Physical Therapy Treatment Patient Details Name: Alexandria Moore MRN: [id number] DOB: 1961/08/26 Today's Date: 01/29/2021    History of Present Illness Pt is a 60 yo F diagnosed with osteoarthritis of right knee and is s/p elective R TKA.  PMH includes: SOB, HTN, anxiety, CHF, L THA, and cervical fusion.    PT Comments    Therapist in this am, pt in too much pain, returned around lunch time.  Pt with c/o 10/10 R Knee pain at rest.  Pt premedicated prior to session, daughter at bedside. Pt tolerated supine to sit with Supervision and HOB raised. BSC functional transfers with use of RW for support and moderate vc's for technique and safety awareness. Pt's tolerance and confidence in mobility improved with each task.  Pt completed 20ft of gait training with RW and light CGA for safety.  AAROM R Knee 10-85 in sitting.  Pt left up in chair with alarm on, call bell in reach, and daughter at side.  Continue to focus on functional mobility to progress pt to stairs prior to d/c home.   Follow Up Recommendations  SNF;Supervision for mobility/OOB     Equipment Recommendations  None recommended by PT    Recommendations for Other Services       Precautions / Restrictions Precautions Precautions: Fall Restrictions Weight Bearing Restrictions: Yes RLE Weight Bearing: Weight bearing as tolerated    Mobility  Bed Mobility Overal bed mobility: Needs Assistance Bed Mobility: Supine to Sit;Sit to Supine     Supine to sit: Supervision Sit to supine: Min assist   General bed mobility comments: Extra time and effort and use of UE to assist RLE out of bed during sup to sit, min A to manage the RLE during sit to sup    Transfers Overall transfer level: Needs assistance Equipment used: Rolling walker (2 wheeled) Transfers: Sit to/from Stand Sit to Stand: Min guard         General transfer comment: Mod verbal and tactile cues for sequencing  Ambulation/Gait Ambulation/Gait assistance:  Min guard Gait Distance (Feet): 5 Feet Assistive device: Rolling walker (2 wheeled) Gait Pattern/deviations: Step-to pattern;Shuffle;Decreased stance time - right Gait velocity: decreased   General Gait Details: Pt only able to take several very small, effortful shuffling steps at the EOB with difficulty clearing the L foot from the floor secondary to pain in the RLE with WB   Stairs             Wheelchair Mobility    Modified Rankin (Stroke Patients Only)       Balance                                            Cognition Arousal/Alertness: Awake/alert Behavior During Therapy: WFL for tasks assessed/performed Overall Cognitive Status: Within Functional Limits for tasks assessed                                        Exercises Total Joint Exercises Goniometric ROM: 10-85    General Comments General comments (skin integrity, edema, etc.):  (Improved mobility and tolerance as session progressed,)      Pertinent Vitals/Pain Pain Assessment: 0-10 Pain Score: 10-Worst pain ever Pain Location: R knee Pain Descriptors / Indicators: Aching;Sore Pain Intervention(s): Premedicated before session;Monitored during session;Ice applied  Home Living                      Prior Function            PT Goals (current goals can now be found in the care plan section) Acute Rehab PT Goals Patient Stated Goal: To walk better without pain    Frequency    BID      PT Plan      Co-evaluation              AM-PAC PT "6 Clicks" Mobility   Outcome Measure  Help needed turning from your back to your side while in a flat bed without using bedrails?: A Little Help needed moving from lying on your back to sitting on the side of a flat bed without using bedrails?: A Little Help needed moving to and from a bed to a chair (including a wheelchair)?: A Little Help needed standing up from a chair using your arms (e.g., wheelchair  or bedside chair)?: A Little Help needed to walk in hospital room?: A Little Help needed climbing 3-5 steps with a railing? : A Little 6 Click Score: 18    End of Session Equipment Utilized During Treatment: Gait belt Activity Tolerance: Patient tolerated treatment well Patient left: in chair;with call bell/phone within reach;with chair alarm set;with family/visitor present Nurse Communication: Mobility status PT Visit Diagnosis: Muscle weakness (generalized) (M62.81);Other abnormalities of gait and mobility (R26.89);Pain Pain - Right/Left: Right Pain - part of body: Knee     Time: 4431-5400 PT Time Calculation (min) (ACUTE ONLY): 41 min  Charges:  $Therapeutic Activity: 38-52 mins                     Zadie Cleverly, PTA

## 2021-01-29 NOTE — Evaluation (Signed)
Occupational Therapy Evaluation Patient Details Name: Alexandria Moore MRN: [id number] DOB: 1960-12-29 Today's Date: 01/29/2021    History of Present Illness Pt is a 60 yo F diagnosed with osteoarthritis of right knee and is s/p elective R TKA.  PMH includes: SOB, HTN, anxiety, CHF, L THA, and cervical fusion.   Clinical Impression   Pt seen for OT Evaluation this date in setting of acute hospitalization s/p R TKA. Pt's daughter present in room throughout session. Pt lives with her spouse in mobile home with 4 STE and is typically INDEP with ADLs and requires MOD I for fxl mobility with cane. Pt's daughter reports she is able to help support pt while she recovers from surgery as well. Pt presents this date with (expected) R LE post-op pain and decreased fxl activity tolerance impacting her ability to safely and efficiently perform ADLs/aDL mobility. Pt requires MIN A/CGA For ADL transfers with RW and requires MIN/MOD A d/t increased pain/decreased ROM For LB ADLs at this time. Pt will require continued skilled OT in acute setting as well as f/u HHOT to ensure safety with ADLs/ADL mobility in the natural environment.     Follow Up Recommendations  Home health OT;Supervision - Intermittent    Equipment Recommendations  3 in 1 bedside commode;Tub/shower seat;Other (comment) (2ww, and grabber)    Recommendations for Other Services       Precautions / Restrictions Precautions Precautions: Fall Restrictions Weight Bearing Restrictions: Yes RLE Weight Bearing: Weight bearing as tolerated      Mobility Bed Mobility Overal bed mobility: Needs Assistance Bed Mobility: Sit to Supine     Supine to sit: Supervision Sit to supine: Min assist   General bed mobility comments: increased time, hook technique with L LE assisting R LE back to bed and MIN A.    Transfers Overall transfer level: Needs assistance Equipment used: Rolling walker (2 wheeled) Transfers: Sit to/from Stand Sit to  Stand: Min guard;Min assist         General transfer comment: CGA from bed surface, but requires slightly increased assistance from recliner/lower surface.    Balance Overall balance assessment: Needs assistance   Sitting balance-Leahy Scale: Good     Standing balance support: During functional activity;Bilateral upper extremity supported Standing balance-Leahy Scale: Fair Standing balance comment: relies on B UE support on RW.                           ADL either performed or assessed with clinical judgement   ADL Overall ADL's : Needs assistance/impaired                                       General ADL Comments: SETUP for seated UB ADLs, MIN/MOD A for seated LB ADLs, attempted with AE, but pt with reasonable hip ROM to use compensatory lower body dressing strategies.     Vision Patient Visual Report: No change from baseline       Perception     Praxis      Pertinent Vitals/Pain Pain Assessment: Faces Pain Score: 10-Worst pain ever Faces Pain Scale: Hurts little more Pain Location: R knee Pain Descriptors / Indicators: Aching;Sore Pain Intervention(s): Limited activity within patient's tolerance;Monitored during session;Premedicated before session;Ice applied     Hand Dominance Right   Extremity/Trunk Assessment Upper Extremity Assessment Upper Extremity Assessment: Overall WFL for tasks assessed  Lower Extremity Assessment Lower Extremity Assessment: Generalized weakness;RLE deficits/detail RLE Deficits / Details: ankle and hip ROM WFL, pt able to reasonably bend at the waist for compensatory lower body technique. Some knee flexion limitations as expected, but can get to 90 degrees for good positioning to prep to stand.       Communication Communication Communication: No difficulties   Cognition Arousal/Alertness: Awake/alert Behavior During Therapy: WFL for tasks assessed/performed Overall Cognitive Status: Within Functional  Limits for tasks assessed                                 General Comments: somewhat drowsy d/t lack of sleep as well as pain medication, but overall appropriate throughout session, able to follow commands/cues.   General Comments     Exercises Other Exercises: OT educates re: compensatory strategies, polar care mgt, compression stocking mgt, lower body AE for ADLs, safety considerations, safe use of RW, strengthening of UEs to help offset weight to LEs. Pt and her daughter who is present throughout, with good understanding. Pt with limited carryover d/t fatigue at time of assessment/education.   Shoulder Instructions      Home Living Family/patient expects to be discharged to:: Private residence Living Arrangements: Spouse/significant other Available Help at Discharge: Family;Available 24 hours/day Type of Home: Mobile home Home Access: Stairs to enter Entrance Stairs-Number of Steps: 4 Entrance Stairs-Rails: Right;Left;Can reach both Home Layout: One level     Bathroom Shower/Tub: Producer, television/film/video: Handicapped height Bathroom Accessibility: Yes   Home Equipment: Bedside commode;Walker - 2 wheels          Prior Functioning/Environment Level of Independence: Independent with assistive device(s)        Comments: Mod Ind amb with a SPC, no fall history, Ind with ADLs, community ambulator        OT Problem List: Decreased strength;Impaired balance (sitting and/or standing);Decreased knowledge of use of DME or AE;Decreased activity tolerance;Pain      OT Treatment/Interventions: Self-care/ADL training;DME and/or AE instruction;Therapeutic activities;Balance training;Therapeutic exercise;Patient/family education    OT Goals(Current goals can be found in the care plan section) Acute Rehab OT Goals Patient Stated Goal: To walk better without pain OT Goal Formulation: With patient Time For Goal Achievement: 02/13/21 Potential to Achieve Goals:  Good ADL Goals Pt Will Perform Lower Body Dressing: with supervision;sit to/from stand (with LRAD/AE PRN) Pt Will Transfer to Toilet: with supervision;ambulating (to [facility] over standard commode in restroom to elevate) Pt/caregiver will Perform Home Exercise Program: Increased strength;Both right and left upper extremity;With Supervision Additional ADL Goal #1: Dtr will demo mgt of polar care and verbalize sequence of compression stockings with <10% verbal cues.  OT Frequency: Min 1X/week   Barriers to D/C:            Co-evaluation              AM-PAC OT "6 Clicks" Daily Activity     Outcome Measure Help from another person eating meals?: None Help from another person taking care of personal grooming?: A Little Help from another person toileting, which includes using toliet, bedpan, or urinal?: A Little Help from another person bathing (including washing, rinsing, drying)?: A Little Help from another person to put on and taking off regular upper body clothing?: None Help from another person to put on and taking off regular lower body clothing?: A Lot 6 Click Score: 19   End of Session Equipment  Utilized During Treatment: Gait belt;Rolling walker Nurse Communication: Mobility status;Other (comment) (notified RN about pt nausea)  Activity Tolerance: Patient tolerated treatment well Patient left: in bed;with call bell/phone within reach;with bed alarm set  OT Visit Diagnosis: Unsteadiness on feet (R26.81);Muscle weakness (generalized) (M62.81);Pain Pain - Right/Left: Right Pain - part of body: Knee                Time: 1324-1403 OT Time Calculation (min): 39 min Charges:  OT General Charges $OT Visit: 1 Visit OT Evaluation $OT Eval Moderate Complexity: 1 Mod OT Treatments $Self Care/Home Management : 8-22 mins $Therapeutic Activity: 8-22 mins  Rejeana Brock, MS, OTR/L ascom [phone] 01/29/21, 3:49 PM

## 2021-01-29 NOTE — Progress Notes (Signed)
   Subjective: 1 Day Post-Op Procedure(s) (LRB): TOTAL KNEE ARTHROPLASTY (Right) Patient reports pain as moderate.   Patient is well, and has had no acute complaints or problems Denies any CP, SOB, ABD pain. We will continue therapy today.    Objective: Vital signs in last 24 hours: Temp:  [96 F (35.6 C)-98.4 F (36.9 C)] 97.5 F (36.4 C) (04/20 0749) Pulse Rate:  [52-75] 67 (04/20 0749) Resp:  [9-19] 16 (04/20 0749) BP: (101-154)/(65-89) 124/75 (04/20 0749) SpO2:  [95 %-100 %] 99 % (04/20 0749) Weight:  [245 kg] 108 kg (04/19 0859)  Intake/Output from previous day: 04/19 0701 - 04/20 0700 In: 2014.9 [P.O.:120; I.V.:1694.9; IV Piggyback:200] Out: 3625 [Urine:3575; Blood:50] Intake/Output this shift: No intake/output data recorded.  Recent Labs    01/28/21 1442 01/29/21 0610  HGB 11.8* 11.1*   Recent Labs    01/28/21 1442 01/29/21 0610  WBC 8.2 8.7  RBC 3.91 3.64*  HCT 36.6 34.0*  PLT 189 184   Recent Labs    01/28/21 1442 01/29/21 0512  NA  --  137  K  --  4.3  CL  --  104  CO2  --  25  BUN  --  13  CREATININE 0.70 0.69  GLUCOSE  --  147*  CALCIUM  --  8.4*   No results for input(s): LABPT, INR in the last 72 hours.  EXAM General - Patient is Alert, Appropriate and Oriented Extremity - Neurovascular intact Sensation intact distally Intact pulses distally Dorsiflexion/Plantar flexion intact No cellulitis present Compartment soft  Dressing - dressing C/D/I, Praveena intact with 50 cc of bloody drainage Motor Function - intact, moving foot and toes well on exam.   Past Medical History:  Diagnosis Date  . Anxiety   . Arthritis    RA  . Borderline diabetes   . CHF (congestive heart failure) (HCC)   . COVID-19 11/2019  . Dyspnea    OFF AND ON SINCE COVID 2021  . GERD (gastroesophageal reflux disease)   . Goiter   . Heart murmur    not being followed or treated. has lost weight with improvement of murmur  . History of blood transfusion     unsure of when she had transfusion but states she had a terrible reaction and needed medication  . Hypertension   . Hyperthyroidism   . Hypothyroidism    not on meds at this time  . Medical history non-contributory   . Pneumonia 11/2019  . Sleep apnea    does not use cpap since weight loss    Assessment/Plan:   1 Day Post-Op Procedure(s) (LRB): TOTAL KNEE ARTHROPLASTY (Right) Active Problems:   S/P TKR (total knee replacement) using cement, right  Estimated body mass index is 42.16 kg/m as calculated from the following:   Height as of this encounter: 5\' 3"  (1.6 m).   Weight as of this encounter: 108 kg. Advance diet Up with therapy  Pain controlled.  Continue with current pain regimen Labs and vital signs are stable Work on bowel movement Care management to assist with discharge.  DVT Prophylaxis - Lovenox, TED hose and SCDs Weight-Bearing as tolerated to right leg   T. , PA-C [facility] 01/29/2021, 8:39 AM

## 2021-01-29 NOTE — Progress Notes (Signed)
Physical Therapy Treatment Patient Details Name: Alexandria Moore MRN: [id number] DOB: 1960/12/15 Today's Date: 01/29/2021    History of Present Illness Pt is a 60 yo F diagnosed with osteoarthritis of right knee and is s/p elective R TKA.  PMH includes: SOB, HTN, anxiety, CHF, L THA, and cervical fusion.    PT Comments    Pt very lethargic this pm due to meds and working with OT earlier. Session spent educating pt and family on exercise program, proper positioning of R LE, and progression towards set goals.  Pt is improving with each session and will need to negotiate stairs prior to retuning home. Pt continues to be a good candidate for short term rehab at SNF setting   Follow Up Recommendations  SNF;Supervision for mobility/OOB     Equipment Recommendations  None recommended by PT    Recommendations for Other Services       Precautions / Restrictions Precautions Precautions: Fall Restrictions Weight Bearing Restrictions: Yes RLE Weight Bearing: Weight bearing as tolerated    Mobility  Bed Mobility Overal bed mobility: Needs Assistance Bed Mobility: Sit to Supine     Supine to sit: Supervision Sit to supine: Min assist   General bed mobility comments: increased time, hook technique with L LE assisting R LE back to bed and MIN A.    Transfers Overall transfer level: Needs assistance Equipment used: Rolling walker (2 wheeled) Transfers: Sit to/from Stand Sit to Stand: Min guard;Min assist         General transfer comment: CGA from bed surface, but requires slightly increased assistance from recliner/lower surface.  Ambulation/Gait Ambulation/Gait assistance: Min guard Gait Distance (Feet): 5 Feet Assistive device: Rolling walker (2 wheeled) Gait Pattern/deviations: Step-to pattern;Shuffle;Decreased stance time - right Gait velocity: decreased   General Gait Details: Pt only able to take several very small, effortful shuffling steps at the EOB with  difficulty clearing the L foot from the floor secondary to pain in the RLE with WB   Stairs             Wheelchair Mobility    Modified Rankin (Stroke Patients Only)       Balance Overall balance assessment: Needs assistance   Sitting balance-Leahy Scale: Good     Standing balance support: During functional activity;Bilateral upper extremity supported Standing balance-Leahy Scale: Fair Standing balance comment: relies on B UE support on RW.                            Cognition Arousal/Alertness: Lethargic;Suspect due to medications Behavior During Therapy: [facility] for tasks assessed/performed Overall Cognitive Status: Within Functional Limits for tasks assessed                                 General Comments: vc's to maintain focus      Exercises Total Joint Exercises Ankle Circles/Pumps: AROM;Strengthening;Both;10 reps Quad Sets: AROM;Strengthening;Right;5 reps;10 reps Long Arc Quad: AROM;Strengthening;Right;5 reps;10 reps Goniometric ROM: 10-85 Other Exercises Other Exercises: Reviewed exercise program with pt, pt's son, and daughter who will encourage independent follow through    General Comments General comments (skin integrity, edema, etc.):  (Improved mobility and tolerance as session progressed,)      Pertinent Vitals/Pain Pain Assessment: 0-10 Pain Score: 9  Faces Pain Scale: Hurts little more Pain Location: R knee Pain Descriptors / Indicators: Aching;Sore Pain Intervention(s): Limited activity within patient's tolerance;Ice applied  Home Living Family/patient expects to be discharged to:: Private residence Living Arrangements: Spouse/significant other Available Help at Discharge: Family;Available 24 hours/day Type of Home: Mobile home Home Access: Stairs to enter Entrance Stairs-Rails: Right;Left;Can reach both Home Layout: One level Home Equipment: Bedside commode;Walker - 2 wheels      Prior Function Level of  Independence: Independent with assistive device(s)      Comments: Mod Ind amb with a SPC, no fall history, Ind with ADLs, community ambulator   PT Goals (current goals can now be found in the care plan section) Acute Rehab PT Goals Patient Stated Goal: To walk better without pain    Frequency    BID      PT Plan      Co-evaluation              AM-PAC PT "6 Clicks" Mobility   Outcome Measure  Help needed turning from your back to your side while in a flat bed without using bedrails?: A Little Help needed moving from lying on your back to sitting on the side of a flat bed without using bedrails?: A Little Help needed moving to and from a bed to a chair (including a wheelchair)?: A Little Help needed standing up from a chair using your arms (e.g., wheelchair or bedside chair)?: A Little Help needed to walk in hospital room?: A Little Help needed climbing 3-5 steps with a railing? : A Little 6 Click Score: 18    End of Session Equipment Utilized During Treatment: Gait belt Activity Tolerance: Patient tolerated treatment well Patient left: in chair;with call bell/phone within reach;with chair alarm set;with family/visitor present Nurse Communication: Mobility status PT Visit Diagnosis: Muscle weakness (generalized) (M62.81);Other abnormalities of gait and mobility (R26.89);Pain Pain - Right/Left: Right Pain - part of body: Knee     Time: 7096-2836 PT Time Calculation (min) (ACUTE ONLY): 25 min  Charges:  $Therapeutic Exercise: 23-37 mins $Therapeutic Activity: 38-52 mins                     Zadie Cleverly, PTA     [name] 01/29/2021, 5:06 PM

## 2021-01-29 NOTE — Progress Notes (Signed)
Pt pain is much better. No nausea. Family brought in food and she ate a sanwich and chips, which was huge. States she feels better. Getting up to the bsc without much pain

## 2021-01-30 LAB — TYPE AND SCREEN
ABO/RH(D): A POS
Antibody Screen: NEGATIVE
Unit division: 0
Unit division: 0

## 2021-01-30 LAB — BPAM RBC
Blood Product Expiration Date: 202205122359
Blood Product Expiration Date: 202205122359
Unit Type and Rh: 6200
Unit Type and Rh: 6200

## 2021-01-30 NOTE — NC FL2 (Signed)
[place] MEDICAID FL2 LEVEL OF CARE SCREENING TOOL     IDENTIFICATION  Patient Name: Alexandria Moore Birthdate: 1961/05/24 Sex: female Admission Date (Current Location): 01/28/2021  [place] and [place] Number:  Chiropodist and Address:  [facility], [address] [place], [place] [address]      Provider Number: [id number]  Attending Physician Name and Address:  Kennedy Bucker, MD  Relative Name and Phone Number:  Makaiah Terwilliger daughter [phone]    Current Level of Care: Hospital Recommended Level of Care: Skilled Nursing Facility Prior Approval Number:    Date Approved/Denied:   PASRR Number: [id number]  Discharge Plan: SNF    Current Diagnoses: Patient Active Problem List   Diagnosis Date Noted  . S/P TKR (total knee replacement) using cement, right 01/28/2021  . Cervical myelopathy (HCC) 01/17/2020  . Essential hypertension 12/05/2019  . Pneumonia due to COVID-19 virus 12/05/2019  . Acute respiratory failure with hypoxia (HCC) 12/05/2019  . Congestive heart failure (HCC) 12/05/2019  . Thrombocytopenia (HCC) 12/05/2019  . Elevated troponin level 12/05/2019  . Primary osteoarthritis of both knees 04/25/2019  . Primary osteoarthritis involving multiple joints 04/25/2019  . Bilateral edema of lower extremity 02/15/2019  . Status post total hip replacement, left 05/12/2018  . BMI 40.0-44.9, adult (HCC) 04/20/2018    Orientation RESPIRATION BLADDER Height & Weight     Self,Time,Situation,Place  Normal Continent Weight: 108 kg Height:  5\' 3"  (160 cm)  BEHAVIORAL SYMPTOMS/MOOD NEUROLOGICAL BOWEL NUTRITION STATUS      Continent Diet (regular)  AMBULATORY STATUS COMMUNICATION OF NEEDS Skin   Extensive Assist Verbally Surgical wounds                       Personal Care Assistance Level of Assistance  Dressing,Bathing Bathing Assistance: Limited assistance   Dressing Assistance: Limited assistance      Functional Limitations Info             SPECIAL CARE FACTORS FREQUENCY  PT (By licensed PT)     PT Frequency: 5 times per week              Contractures Contractures Info: Not present    Additional Factors Info  Code Status,Allergies Code Status Info: full code Allergies Info: Hydromorphone, Lisinopril, Meloxicam, Adhesive Tape           Current Medications (01/30/2021):  This is the current hospital active medication list Current Facility-Administered Medications  Medication Dose Route Frequency Provider Last Rate Last Admin  . 0.9 %  sodium chloride infusion   Intravenous Continuous 02/01/2021, MD   Stopped at 01/29/21 1408  . acetaminophen (TYLENOL) tablet 325-650 mg  325-650 mg Oral Q6H PRN 01/31/21, MD      . alum & mag hydroxide-simeth (MAALOX/MYLANTA) 200-200-20 MG/5ML suspension 30 mL  30 mL Oral Q4H PRN 04-20-2001, MD      . amLODipine (NORVASC) tablet 10 mg  10 mg Oral Daily Kennedy Bucker, MD   10 mg at 01/30/21 0944  . bisacodyl (DULCOLAX) suppository 10 mg  10 mg Rectal Daily PRN 02/01/21, MD      . Chlorhexidine Gluconate Cloth 2 % PADS 6 each  6 each Topical Daily Kennedy Bucker, MD      . diphenhydrAMINE (BENADRYL) 12.5 MG/5ML elixir 12.5-25 mg  12.5-25 mg Oral Q4H PRN 11-11-1984, MD      . docusate sodium (COLACE) capsule 100 mg  100 mg Oral BID Kennedy Bucker,  MD   100 mg at 01/30/21 0945  . enoxaparin (LOVENOX) injection 30 mg  30 mg Subcutaneous Q12H Kennedy Bucker, MD   30 mg at 01/30/21 0945  . fentaNYL (DURAGESIC) 12 MCG/HR 1 patch  1 patch Transdermal Q72H Kennedy Bucker, MD   1 patch at 01/29/21 1610  . folic acid (FOLVITE) tablet 1 mg  1 mg Oral Daily Kennedy Bucker, MD   1 mg at 01/30/21 0945  . furosemide (LASIX) tablet 20 mg  20 mg Oral Daily Kennedy Bucker, MD   20 mg at 01/30/21 0944  . gabapentin (NEURONTIN) capsule 300 mg  300 mg Oral TID PRN Kennedy Bucker, MD   300 mg at 01/30/21 0944  . levothyroxine (SYNTHROID) tablet 88  mcg  88 mcg Oral QAC breakfast Kennedy Bucker, MD   88 mcg at 01/30/21 0630  . losartan (COZAAR) tablet 100 mg  100 mg Oral Daily Kennedy Bucker, MD   100 mg at 01/30/21 0944  . magnesium citrate solution 1 Bottle  1 Bottle Oral Once PRN Kennedy Bucker, MD      . magnesium hydroxide (MILK OF MAGNESIA) suspension 30 mL  30 mL Oral Daily PRN Kennedy Bucker, MD   30 mL at 01/30/21 1017  . menthol-cetylpyridinium (CEPACOL) lozenge 3 mg  1 lozenge Oral PRN Kennedy Bucker, MD       Or  . phenol (CHLORASEPTIC) mouth spray 1 spray  1 spray Mouth/Throat PRN Kennedy Bucker, MD      . methocarbamol (ROBAXIN) tablet 500 mg  500 mg Oral Q6H PRN Kennedy Bucker, MD   500 mg at 01/30/21 1017   Or  . methocarbamol (ROBAXIN) 500 mg in dextrose 5 % 50 mL IVPB  500 mg Intravenous Q6H PRN Kennedy Bucker, MD      . Melene Muller ON 02/02/2021] methotrexate (RHEUMATREX) tablet 15 mg  15 mg Oral Q Antony Odea, Casimiro Needle, MD      . metoCLOPramide [facility]) tablet 5-10 mg  5-10 mg Oral Q8H PRN Kennedy Bucker, MD   10 mg at 01/29/21 1218   Or  . metoCLOPramide (REGLAN) injection 5-10 mg  5-10 mg Intravenous Q8H PRN Kennedy Bucker, MD      . morphine 2 MG/ML injection 0.5-1 mg  0.5-1 mg Intravenous Q2H PRN Kennedy Bucker, MD   1 mg at 01/29/21 0856  . ondansetron (ZOFRAN) tablet 4 mg  4 mg Oral Q6H PRN Kennedy Bucker, MD   4 mg at 01/29/21 1407   Or  . ondansetron (ZOFRAN) injection 4 mg  4 mg Intravenous Q6H PRN Kennedy Bucker, MD      . oxyCODONE (Oxy IR/ROXICODONE) immediate release tablet 10 mg  10 mg Oral Q3H PRN Kennedy Bucker, MD   10 mg at 01/30/21 0945  . pantoprazole (PROTONIX) EC tablet 40 mg  40 mg Oral Daily Kennedy Bucker, MD   40 mg at 01/30/21 0944  . scopolamine (TRANSDERM-SCOP) 1 MG/3DAYS 1.5 mg  1 patch Transdermal Q72H Kennedy Bucker, MD   1.5 mg at 01/29/21 1612  . traMADol (ULTRAM) tablet 50 mg  50 mg Oral Q6H Kennedy Bucker, MD   50 mg at 01/30/21 0630  . zolpidem (AMBIEN) tablet 5 mg  5 mg Oral QHS PRN Kennedy Bucker, MD   5 mg  at 01/29/21 2212     Discharge Medications: Please see discharge summary for a list of discharge medications.  Relevant Imaging Results:  Relevant Lab Results:   Additional Information SS# [id number]  Barrie Dunker, RN

## 2021-01-30 NOTE — Plan of Care (Signed)
  Problem: Education: Goal: Knowledge of General Education information will improve Description: Including pain rating scale, medication(s)/side effects and non-pharmacologic comfort measures 01/30/2021 1002 by Jean Rosenthal, RN Outcome: Progressing 01/30/2021 0717 by Jean Rosenthal, RN Outcome: Progressing   Problem: Health Behavior/Discharge Planning: Goal: Ability to manage health-related needs will improve 01/30/2021 1002 by Jean Rosenthal, RN Outcome: Progressing 01/30/2021 0717 by Jean Rosenthal, RN Outcome: Progressing   Problem: Clinical Measurements: Goal: Ability to maintain clinical measurements within normal limits will improve 01/30/2021 1002 by Jean Rosenthal, RN Outcome: Progressing 01/30/2021 0717 by Jean Rosenthal, RN Outcome: Progressing Goal: Will remain free from infection 01/30/2021 1002 by Jean Rosenthal, RN Outcome: Progressing 01/30/2021 0717 by Jean Rosenthal, RN Outcome: Progressing Goal: Diagnostic test results will improve 01/30/2021 1002 by Jean Rosenthal, RN Outcome: Progressing 01/30/2021 0717 by Jean Rosenthal, RN Outcome: Progressing Goal: Respiratory complications will improve 01/30/2021 1002 by Jean Rosenthal, RN Outcome: Progressing 01/30/2021 0717 by Jean Rosenthal, RN Outcome: Progressing Goal: Cardiovascular complication will be avoided 01/30/2021 1002 by Jean Rosenthal, RN Outcome: Progressing 01/30/2021 0717 by Jean Rosenthal, RN Outcome: Progressing   Problem: Pain Managment: Goal: General experience of comfort will improve 01/30/2021 1002 by Jean Rosenthal, RN Outcome: Progressing 01/30/2021 0717 by Jean Rosenthal, RN Outcome: Progressing   Problem: Safety: Goal: Ability to remain free from injury will improve 01/30/2021 1002 by Jean Rosenthal, RN Outcome: Progressing 01/30/2021 0717 by Jean Rosenthal, RN Outcome: Progressing   Problem: Activity: Goal: Range of joint motion will  improve 01/30/2021 1002 by Jean Rosenthal, RN Outcome: Progressing 01/30/2021 0717 by Jean Rosenthal, RN Outcome: Progressing

## 2021-01-30 NOTE — Progress Notes (Signed)
Physical Therapy Treatment Patient Details Name: Alexandria Moore MRN: [id number] DOB: 04/19/1961 Today's Date: 01/30/2021    History of Present Illness Pt is a 60 yo F diagnosed with osteoarthritis of right knee and is s/p elective R TKA.  PMH includes: SOB, HTN, anxiety, CHF, L THA, and cervical fusion.    PT Comments    Pt struggled this afternoon to maintain alertness and fully participate in desired tasks.  Had planned on attempting stairs, however pt unsafe at this time due to lethargy possibly from meds.  Pt also required more physical help to raise from [facility] with increased time for gait training in room 38ft with RW and CGA.  Pt's daughter and fiance present for session.  Discussed benefits of short term SNF placement since pt was declining placement.  However, pt and family agreed, CM in to meet with pt, d/c plans initiated.  Continue PT per POC until d/c.  Follow Up Recommendations  SNF;Supervision for mobility/OOB     Equipment Recommendations  None recommended by PT    Recommendations for Other Services       Precautions / Restrictions Precautions Precautions: Fall Restrictions Weight Bearing Restrictions: Yes RLE Weight Bearing: Weight bearing as tolerated    Mobility  Bed Mobility                    Transfers Overall transfer level: Needs assistance Equipment used: Rolling walker (2 wheeled) Transfers: Sit to/from Stand Sit to Stand: Mod assist         General transfer comment:  (Increased assistance needed compared to this morning)  Ambulation/Gait Ambulation/Gait assistance: Min guard Gait Distance (Feet): 45 Feet Assistive device: Rolling walker (2 wheeled) Gait Pattern/deviations: Step-to pattern;Shuffle;Decreased stance time - right     General Gait Details: Very slow cadence   Stairs Stairs: Yes       General stair comments:  (Planned on attempting stairs this pm, pt too "groggy" to tolerate and requiring increased assistance for  sit to stand transfers)   Wheelchair Mobility    Modified Rankin (Stroke Patients Only)       Balance                                            Cognition Arousal/Alertness: Lethargic;Suspect due to medications Behavior During Therapy: WFL for tasks assessed/performed Overall Cognitive Status: Within Functional Limits for tasks assessed                                 General Comments:  (Pt very lethargic this pm, diffcult maintaining eye contact and remain on task)      Exercises      General Comments        Pertinent Vitals/Pain Pain Assessment: 0-10 Pain Score: 5  Pain Location: R knee Pain Descriptors / Indicators: Aching;Sore Pain Intervention(s): Monitored during session;Ice applied    Home Living                      Prior Function            PT Goals (current goals can now be found in the care plan section) Acute Rehab PT Goals Patient Stated Goal: To walk better without pain    Frequency    BID      PT  Plan      Co-evaluation              AM-PAC PT "6 Clicks" Mobility   Outcome Measure  Help needed turning from your back to your side while in a flat bed without using bedrails?: A Little Help needed moving from lying on your back to sitting on the side of a flat bed without using bedrails?: A Little Help needed moving to and from a bed to a chair (including a wheelchair)?: A Little Help needed standing up from a chair using your arms (e.g., wheelchair or bedside chair)?: A Lot Help needed to walk in hospital room?: A Little Help needed climbing 3-5 steps with a railing? : A Lot 6 Click Score: 16    End of Session Equipment Utilized During Treatment: Gait belt Activity Tolerance: Patient limited by lethargy;Patient limited by pain Patient left: in chair;with call bell/phone within reach;with chair alarm set;with family/visitor present Nurse Communication: Mobility status PT Visit  Diagnosis: Muscle weakness (generalized) (M62.81);Other abnormalities of gait and mobility (R26.89);Pain Pain - Right/Left: Right Pain - part of body: Knee     Time: 1500-1540 PT Time Calculation (min) (ACUTE ONLY): 40 min  Charges:  $Gait Training: 8-22 mins $Therapeutic Exercise: 8-22 mins $Therapeutic Activity: 8-22 mins                     Zadie Cleverly, PTA

## 2021-01-30 NOTE — Progress Notes (Signed)
   Subjective: 2 Days Post-Op Procedure(s) (LRB): TOTAL KNEE ARTHROPLASTY (Right) Patient reports pain as moderate.   Patient is well, and has had no acute complaints or problems Denies any CP, SOB, ABD pain. We will continue therapy today.    Objective: Vital signs in last 24 hours: Temp:  [97.5 F (36.4 C)-98.4 F (36.9 C)] 98.4 F (36.9 C) (04/21 0513) Pulse Rate:  [67-85] 85 (04/21 0513) Resp:  [16-19] 16 (04/21 0513) BP: (109-129)/(69-75) 115/69 (04/21 0513) SpO2:  [93 %-100 %] 94 % (04/21 0513)  Intake/Output from previous day: 04/20 0701 - 04/21 0700 In: 2748.6 [P.O.:2080; I.V.:668.6] Out: 50 [Drains:50] Intake/Output this shift: No intake/output data recorded.  Recent Labs    01/28/21 1442 01/29/21 0610  HGB 11.8* 11.1*   Recent Labs    01/28/21 1442 01/29/21 0610  WBC 8.2 8.7  RBC 3.91 3.64*  HCT 36.6 34.0*  PLT 189 184   Recent Labs    01/28/21 1442 01/29/21 0512  NA  --  137  K  --  4.3  CL  --  104  CO2  --  25  BUN  --  13  CREATININE 0.70 0.69  GLUCOSE  --  147*  CALCIUM  --  8.4*   No results for input(s): LABPT, INR in the last 72 hours.  EXAM General - Patient is Alert, Appropriate and Oriented Extremity - Neurovascular intact Sensation intact distally Intact pulses distally Dorsiflexion/Plantar flexion intact No cellulitis present Compartment soft  Dressing - dressing C/D/I, Praveena intact with 50 cc of bloody drainage Motor Function - intact, moving foot and toes well on exam.  The patient ambulated 50 feet.  Past Medical History:  Diagnosis Date  . Anxiety   . Arthritis    RA  . Borderline diabetes   . CHF (congestive heart failure) (HCC)   . COVID-19 11/2019  . Dyspnea    OFF AND ON SINCE COVID 2021  . GERD (gastroesophageal reflux disease)   . Goiter   . Heart murmur    not being followed or treated. has lost weight with improvement of murmur  . History of blood transfusion    unsure of when she had transfusion  but states she had a terrible reaction and needed medication  . Hypertension   . Hyperthyroidism   . Hypothyroidism    not on meds at this time  . Medical history non-contributory   . Pneumonia 11/2019  . Sleep apnea    does not use cpap since weight loss    Assessment/Plan:   2 Days Post-Op Procedure(s) (LRB): TOTAL KNEE ARTHROPLASTY (Right) Active Problems:   S/P TKR (total knee replacement) using cement, right  Estimated body mass index is 42.16 kg/m as calculated from the following:   Height as of this encounter: 5\' 3"  (1.6 m).   Weight as of this encounter: 108 kg. Advance diet Up with therapy  Pain controlled.  Continue with current pain regimen Labs and vital signs are stable Work on bowel movement Care management to assist with discharge.  Plan to discharge to rehab versus home Friday.  DVT Prophylaxis - Lovenox, TED hose and SCDs Weight-Bearing as tolerated to right leg   Saturday, PA-C [facility] 01/30/2021, 6:42 AM

## 2021-01-30 NOTE — TOC Progression Note (Signed)
Transition of Care [facility]) - Progression Note    Patient Details  Name: Alexandria Moore MRN: [id number] Date of Birth: 10/04/1961  Transition of [facility] CM/SW [place], RN Phone Number: 01/30/2021, 3:50 PM  Clinical Narrative:   Met with the patient and her family, she would like to go to Rehab, they are agreeable to a bed search.  PASSR, FL2 and Bedsearch completed, will review once bed offers are obtained         Expected Discharge Plan and Services                                                 Social Determinants of Health (SDOH) Interventions    Readmission Risk Interventions No flowsheet data found.

## 2021-01-30 NOTE — Progress Notes (Signed)
Physical Therapy Treatment Patient Details Name: Alexandria Moore MRN: [id number] DOB: 06-Feb-1961 Today's Date: 01/30/2021    History of Present Illness Pt is a 60 yo F diagnosed with osteoarthritis of right knee and is s/p elective R TKA.  PMH includes: SOB, HTN, anxiety, CHF, L THA, and cervical fusion.    PT Comments    Pt appears much "brighter this am with Improved tolerance for mobility.  She was able to transfer to the EOB with use of side rail and raise to standing with supervision.  Gait training increased to 34ft x 2 with CGA for safety and vc's for gait pattern and posture.  Pt able to tolerate more weight acceptance through R LE while trying to promote heel strike.  R knee AROM sitting 5-90.  Will attempt stairs this afternoon when daughter arrives to assess for safe d/c dispo.   Follow Up Recommendations  SNF;Supervision for mobility/OOB     Equipment Recommendations       Recommendations for Other Services       Precautions / Restrictions Precautions Precautions: Fall Restrictions Weight Bearing Restrictions: Yes RLE Weight Bearing: Weight bearing as tolerated    Mobility  Bed Mobility Overal bed mobility: Needs Assistance Bed Mobility: Sit to Supine     Supine to sit: Supervision;HOB elevated (increased reliance on bed rail)     General bed mobility comments:  (Increased time to complete due to L hip pain)    Transfers Overall transfer level: Needs assistance Equipment used: Rolling walker (2 wheeled) Transfers: Sit to/from Stand Sit to Stand: Min guard         General transfer comment: Improved transfers from yesterday  Ambulation/Gait Ambulation/Gait assistance: Min guard Gait Distance (Feet): 30 Feet Assistive device: Rolling walker (2 wheeled) Gait Pattern/deviations: Step-to pattern;Shuffle;Decreased stance time - right Gait velocity: decreased   General Gait Details: 8ft x 2 with seated rest break, Improved gait pattern and distance  today   Stairs Stairs: Yes       General stair comments: Will attempt this afternoon   Wheelchair Mobility    Modified Rankin (Stroke Patients Only)       Balance   Sitting-balance support: No upper extremity supported;Feet unsupported Sitting balance-Leahy Scale: Good                                      Cognition Arousal/Alertness: Awake/alert Behavior During Therapy: WFL for tasks assessed/performed Overall Cognitive Status: Within Functional Limits for tasks assessed                                 General Comments: repeated vc's for safety and mobility      Exercises Total Joint Exercises Ankle Circles/Pumps: AROM;Both;20 reps Long Arc Quad: AROM;Right;10 reps    General Comments        Pertinent Vitals/Pain Pain Assessment: 0-10 Pain Score: 6  Pain Location: R knee Pain Descriptors / Indicators: Aching;Sore Pain Intervention(s): Monitored during session;Premedicated before session    Home Living                      Prior Function            PT Goals (current goals can now be found in the care plan section) Acute Rehab PT Goals Patient Stated Goal: To walk better without pain  Frequency    BID      PT Plan      Co-evaluation              AM-PAC PT "6 Clicks" Mobility   Outcome Measure  Help needed turning from your back to your side while in a flat bed without using bedrails?: A Little Help needed moving from lying on your back to sitting on the side of a flat bed without using bedrails?: A Little Help needed moving to and from a bed to a chair (including a wheelchair)?: A Little Help needed standing up from a chair using your arms (e.g., wheelchair or bedside chair)?: A Little Help needed to walk in hospital room?: A Little Help needed climbing 3-5 steps with a railing? : A Little 6 Click Score: 18    End of Session Equipment Utilized During Treatment: Gait belt Activity  Tolerance: Patient tolerated treatment well Patient left: in chair;with call bell/phone within reach;with chair alarm set;with family/visitor present Nurse Communication: Mobility status PT Visit Diagnosis: Muscle weakness (generalized) (M62.81);Other abnormalities of gait and mobility (R26.89);Pain Pain - Right/Left: Right Pain - part of body: Knee     Time: 1000-1040 PT Time Calculation (min) (ACUTE ONLY): 40 min  Charges:  $Gait Training: 8-22 mins $Therapeutic Exercise: 8-22 mins $Therapeutic Activity: 8-22 mins                    Zadie Cleverly, PTA   [name] 01/30/2021, 11:23 AM

## 2021-01-30 NOTE — Plan of Care (Signed)
  Problem: Education: Goal: Knowledge of General Education information will improve Description: Including pain rating scale, medication(s)/side effects and non-pharmacologic comfort measures Outcome: Progressing   Problem: Health Behavior/Discharge Planning: Goal: Ability to manage health-related needs will improve Outcome: Progressing   Problem: Clinical Measurements: Goal: Ability to maintain clinical measurements within normal limits will improve Outcome: Progressing Goal: Will remain free from infection Outcome: Progressing Goal: Diagnostic test results will improve Outcome: Progressing Goal: Respiratory complications will improve Outcome: Progressing Goal: Cardiovascular complication will be avoided Outcome: Progressing   Problem: Pain Managment: Goal: General experience of comfort will improve Outcome: Progressing   Problem: Safety: Goal: Ability to remain free from injury will improve Outcome: Progressing   Problem: Activity: Goal: Range of joint motion will improve Outcome: Progressing

## 2021-01-30 NOTE — Anesthesia Postprocedure Evaluation (Signed)
Anesthesia Post Note  Patient: Alexandria Moore  Procedure(s) Performed: TOTAL KNEE ARTHROPLASTY (Right Knee)  Patient location during evaluation: PACU Anesthesia Type: Combined General/Spinal Level of consciousness: oriented and awake and alert Pain management: pain level controlled Vital Signs Assessment: post-procedure vital signs reviewed and stable Respiratory status: spontaneous breathing, respiratory function stable and patient connected to nasal cannula oxygen Cardiovascular status: blood pressure returned to baseline and stable Postop Assessment: no headache, no backache, no apparent nausea or vomiting and spinal receding Anesthetic complications: no   No complications documented.   Last Vitals:  Vitals:   01/30/21 0513 01/30/21 0740  BP: 115/69 111/62  Pulse: 85 83  Resp: 16 16  Temp: 36.9 C 36.8 C  SpO2: 94% 95%    Last Pain:  Vitals:   01/30/21 0740  TempSrc: Oral  PainSc:                  Corinda Gubler

## 2021-01-30 NOTE — Progress Notes (Signed)
Patient not able to tolerate Foam Bone at this time. States that it cause too much pain. Will try to place again later in shift and after pain med is effective against leg movement.

## 2021-01-31 ENCOUNTER — Other Ambulatory Visit: Payer: BC Managed Care – PPO

## 2021-01-31 DIAGNOSIS — Z79899 Other long term (current) drug therapy: Secondary | ICD-10-CM | POA: Diagnosis not present

## 2021-01-31 DIAGNOSIS — I110 Hypertensive heart disease with heart failure: Secondary | ICD-10-CM | POA: Diagnosis present

## 2021-01-31 DIAGNOSIS — E039 Hypothyroidism, unspecified: Secondary | ICD-10-CM | POA: Diagnosis present

## 2021-01-31 DIAGNOSIS — I509 Heart failure, unspecified: Secondary | ICD-10-CM | POA: Diagnosis present

## 2021-01-31 DIAGNOSIS — K219 Gastro-esophageal reflux disease without esophagitis: Secondary | ICD-10-CM | POA: Diagnosis present

## 2021-01-31 DIAGNOSIS — E785 Hyperlipidemia, unspecified: Secondary | ICD-10-CM | POA: Diagnosis present

## 2021-01-31 DIAGNOSIS — Z6841 Body Mass Index (BMI) 40.0 and over, adult: Secondary | ICD-10-CM | POA: Diagnosis not present

## 2021-01-31 DIAGNOSIS — M1711 Unilateral primary osteoarthritis, right knee: Secondary | ICD-10-CM | POA: Diagnosis present

## 2021-01-31 DIAGNOSIS — Z96651 Presence of right artificial knee joint: Secondary | ICD-10-CM

## 2021-01-31 DIAGNOSIS — M069 Rheumatoid arthritis, unspecified: Secondary | ICD-10-CM | POA: Diagnosis present

## 2021-01-31 DIAGNOSIS — Z87891 Personal history of nicotine dependence: Secondary | ICD-10-CM | POA: Diagnosis not present

## 2021-01-31 DIAGNOSIS — Z79890 Hormone replacement therapy (postmenopausal): Secondary | ICD-10-CM | POA: Diagnosis not present

## 2021-01-31 DIAGNOSIS — Z8616 Personal history of COVID-19: Secondary | ICD-10-CM | POA: Diagnosis not present

## 2021-01-31 MED ORDER — FENTANYL 12 MCG/HR TD PT72: 1.0000 | MEDICATED_PATCH | TRANSDERMAL | 0 refills | Status: DC

## 2021-01-31 MED ORDER — METHOCARBAMOL 500 MG PO TABS: 500.0000 mg | ORAL_TABLET | Freq: Four times a day (QID) | ORAL | 0 refills | Status: DC | PRN

## 2021-01-31 MED ORDER — ENOXAPARIN SODIUM 40 MG/0.4ML ~~LOC~~ SOLN: 40.0000 mg | SUBCUTANEOUS | 0 refills | Status: DC

## 2021-01-31 MED ORDER — MAGNESIUM CITRATE PO SOLN
1.0000 | Freq: Once | ORAL | Status: AC
  Administered 2021-01-31: 1 via ORAL
  Filled 2021-01-31: qty 296

## 2021-01-31 MED ORDER — DOCUSATE SODIUM 100 MG PO CAPS: 100.0000 mg | ORAL_CAPSULE | Freq: Two times a day (BID) | ORAL | 0 refills | Status: DC

## 2021-01-31 MED ORDER — OXYCODONE HCL 5 MG PO TABS: 5.0000 mg | ORAL_TABLET | ORAL | 0 refills | Status: DC | PRN

## 2021-01-31 MED ORDER — ONDANSETRON HCL 8 MG PO TABS: 8.0000 mg | ORAL_TABLET | Freq: Three times a day (TID) | ORAL | 0 refills | Status: DC | PRN

## 2021-01-31 NOTE — Plan of Care (Signed)

## 2021-01-31 NOTE — Progress Notes (Addendum)
Physical Therapy Treatment Patient Details Name: Alexandria Moore MRN: [id number] DOB: [birth year] Today's Date: 01/31/2021    History of Present Illness Pt is a 60 yo F diagnosed with osteoarthritis of right knee and is s/p elective R TKA.  PMH includes: SOB, HTN, anxiety, CHF, L THA, and cervical fusion.    PT Comments    Pt was sitting in recliner pre/post session. Supportive daughter present throughout and very helpful. Daughter will be providing 24/7 care at DC. Overall pt was able to demonstrated safe mobility/transfers/ and gait. Pt moves slow however is safe without LOB or unsteadiness. Performed ascending/decsending stairs with BUE on rails. Both pt/pt's daughter feel confident in pt's abilities to perform at DC. Pt is cleared from PT standpoint for DC to home with HHPT to follow. She was in recliner post session with call bell in reach and RN aware of pt's abilities.     Follow Up Recommendations  Home health PT     Equipment Recommendations  None recommended by PT       Precautions / Restrictions Precautions Precautions: Fall;Knee Precaution Booklet Issued: Yes (comment) Restrictions Weight Bearing Restrictions: Yes RLE Weight Bearing: Weight bearing as tolerated    Mobility  Bed Mobility    General bed mobility comments: pt was getting back into bed form going to BR woith daughter upon arriving. required min assist form daughter to achieve    Transfers Overall transfer level: Needs assistance Equipment used: Rolling walker (2 wheeled) Transfers: Sit to/from Stand Sit to Stand: Min guard         General transfer comment: pt requested not to get OOB this session since she just got back into bed. Did agree and perform ROM/strengthening exercises in bed  Ambulation/Gait Ambulation/Gait assistance: Min guard Gait Distance (Feet): 75 Feet Assistive device: Rolling walker (2 wheeled) Gait Pattern/deviations: Step-to pattern;Shuffle;Decreased stance time -  right Gait velocity: decreased   General Gait Details: Very slow cadence   Stairs Stairs: Yes Stairs assistance: Min guard Stair Management: Two rails;Step to pattern Number of Stairs: 4 General stair comments: Pt was able to ascend/decsend 4 stair with CGA only for safety.     Balance Overall balance assessment: Needs assistance Sitting-balance support: No upper extremity supported;Feet unsupported Sitting balance-Leahy Scale: Good     Standing balance support: During functional activity;Bilateral upper extremity supported Standing balance-Leahy Scale: Good Standing balance comment: no LOB throughout session. slow moving but no LOB or unsteadiness     Cognition Arousal/Alertness: Awake/alert Behavior During Therapy: WFL for tasks assessed/performed Overall Cognitive Status: Within Functional Limits for tasks assessed              Pertinent Vitals/Pain Pain Assessment: 0-10 Pain Score: 6  Faces Pain Scale: Hurts even more Pain Location: R knee Pain Descriptors / Indicators: Aching;Sore Pain Intervention(s): Limited activity within patient's tolerance;Monitored during session;Premedicated before session;Repositioned;Ice applied;Relaxation           PT Goals (current goals can now be found in the care plan section) Acute Rehab PT Goals Patient Stated Goal: go home when ready Progress towards PT goals: Progressing toward goals    Frequency    BID      PT Plan Discharge plan needs to be updated       AM-PAC PT "6 Clicks" Mobility   Outcome Measure  Help needed turning from your back to your side while in a flat bed without using bedrails?: A Little Help needed moving from lying on your back to sitting on  the side of a flat bed without using bedrails?: A Little Help needed moving to and from a bed to a chair (including a wheelchair)?: A Little Help needed standing up from a chair using your arms (e.g., wheelchair or bedside chair)?: A Little Help needed  to walk in hospital room?: A Little Help needed climbing 3-5 steps with a railing? : A Little 6 Click Score: 18    End of Session Equipment Utilized During Treatment: Gait belt Activity Tolerance: Patient tolerated treatment well Patient left: in bed;with call bell/phone within reach;with bed alarm set;with family/visitor present Nurse Communication: Mobility status PT Visit Diagnosis: Muscle weakness (generalized) (M62.81);Other abnormalities of gait and mobility (R26.89);Pain Pain - Right/Left: Right Pain - part of body: Knee     Time: 4818-5631 PT Time Calculation (min) (ACUTE ONLY): 24 min  Charges:  $Gait Training: 23-37 mins $Therapeutic Exercise: 23-37 mins                     Jetta Lout PTA 01/31/21, 2:43 PM

## 2021-01-31 NOTE — Progress Notes (Signed)
Physical Therapy Treatment Patient Details Name: Alexandria Moore MRN: [id number] DOB: 1961-06-29 Today's Date: 01/31/2021    History of Present Illness Pt is a 60 yo F diagnosed with osteoarthritis of right knee and is s/p elective R TKA.  PMH includes: SOB, HTN, anxiety, CHF, L THA, and cervical fusion.    PT Comments    Pt has just returned to bed with assistance form daughter. She requested not to get OOB again today but did agree to there ex and ROM. Performed HEP and reviewed all safety concerns about upcoming DC to home. Pt will continue to benefit from HHPT at DC to address deficits while maximizing independence.    Follow Up Recommendations  Home health PT     Equipment Recommendations  None recommended by PT       Precautions / Restrictions Precautions Precautions: Fall;Knee Precaution Booklet Issued: Yes (comment) Restrictions Weight Bearing Restrictions: Yes RLE Weight Bearing: Weight bearing as tolerated    Mobility  Bed Mobility    General bed mobility comments: pt was getting back into bed form going to BR woith daughter upon arriving. required min assist form daughter to achieve    Transfers Overall transfer level: Needs assistance Equipment used: Rolling walker (2 wheeled) Transfers: Sit to/from Stand Sit to Stand: Min guard         General transfer comment: pt requested not to get OOB this session since she just got back into bed. Did agree and perform ROM/strengthening exercises in bed  Ambulation/Gait Ambulation/Gait assistance: Min guard Gait Distance (Feet): 75 Feet Assistive device: Rolling walker (2 wheeled) Gait Pattern/deviations: Step-to pattern;Shuffle;Decreased stance time - right Gait velocity: decreased   General Gait Details: Very slow cadence   Stairs Stairs: Yes Stairs assistance: Min guard Stair Management: Two rails;Step to pattern Number of Stairs: 4 General stair comments: Pt was able to ascend/decsend 4 stair with  CGA only for safety.     Balance Overall balance assessment: Needs assistance Sitting-balance support: No upper extremity supported;Feet unsupported Sitting balance-Leahy Scale: Good     Standing balance support: During functional activity;Bilateral upper extremity supported Standing balance-Leahy Scale: Good Standing balance comment: no LOB throughout session. slow moving but no LOB or unsteadiness      Cognition Arousal/Alertness: Awake/alert Behavior During Therapy: WFL for tasks assessed/performed Overall Cognitive Status: Within Functional Limits for tasks assessed      General Comments: Pt is A and O x 4      Exercises Total Joint Exercises Ankle Circles/Pumps: AROM;Both;20 reps Quad Sets: AROM;Strengthening;Right;5 reps;10 reps Heel Slides: AROM;AAROM;10 reps Hip ABduction/ADduction: AAROM;10 reps Straight Leg Raises: AAROM;10 reps Knee Flexion: AROM;Strengthening;Right;5 reps;10 reps Goniometric ROM: 82 degrees flexion        Pertinent Vitals/Pain Pain Assessment: 0-10 Pain Score: 6  Faces Pain Scale: Hurts even more Pain Location: R knee Pain Descriptors / Indicators: Aching;Sore Pain Intervention(s): Limited activity within patient's tolerance;Monitored during session;Premedicated before session;Repositioned;Ice applied;Relaxation           PT Goals (current goals can now be found in the care plan section) Acute Rehab PT Goals Patient Stated Goal: go home when ready Progress towards PT goals: Progressing toward goals    Frequency    BID      PT Plan Discharge plan needs to be updated       AM-PAC PT "6 Clicks" Mobility   Outcome Measure  Help needed turning from your back to your side while in a flat bed without using bedrails?: A Little Help  needed moving from lying on your back to sitting on the side of a flat bed without using bedrails?: A Little Help needed moving to and from a bed to a chair (including a wheelchair)?: A Little Help  needed standing up from a chair using your arms (e.g., wheelchair or bedside chair)?: A Little Help needed to walk in hospital room?: A Little Help needed climbing 3-5 steps with a railing? : A Little 6 Click Score: 18    End of Session Equipment Utilized During Treatment: Gait belt Activity Tolerance: Patient tolerated treatment well Patient left: in bed;with call bell/phone within reach;with bed alarm set;with family/visitor present Nurse Communication: Mobility status PT Visit Diagnosis: Muscle weakness (generalized) (M62.81);Other abnormalities of gait and mobility (R26.89);Pain Pain - Right/Left: Right Pain - part of body: Knee     Time: 1173-5670 PT Time Calculation (min) (ACUTE ONLY): 24 min  Charges:  $Gait Training: 23-37 mins $Therapeutic Exercise: 23-37 mins                     Jetta Lout PTA 01/31/21, 2:46 PM

## 2021-01-31 NOTE — Progress Notes (Signed)
Subjective: 3 Days Post-Op Procedure(s) (LRB): TOTAL KNEE ARTHROPLASTY (Right) Patient reports pain as moderate.   Patient is well, and has had no acute complaints or problems Denies any CP, SOB, ABD pain. We will continue therapy today.  Plan is for possible discharge to Rehab vs. HHPT. Will see how she does with PT this AM.  Objective: Vital signs in last 24 hours: Temp:  [98.2 F (36.8 C)-99 F (37.2 C)] 99 F (37.2 C) (04/22 0510) Pulse Rate:  [81-92] 91 (04/22 0510) Resp:  [16-17] 17 (04/22 0510) BP: (113-138)/(45-80) 118/45 (04/22 0510) SpO2:  [95 %-100 %] 99 % (04/22 0510)  Intake/Output from previous day: 04/21 0701 - 04/22 0700 In: 0  Out: 350 [Urine:250; Drains:100] Intake/Output this shift: No intake/output data recorded.  Recent Labs    01/28/21 1442 01/29/21 0610  HGB 11.8* 11.1*   Recent Labs    01/28/21 1442 01/29/21 0610  WBC 8.2 8.7  RBC 3.91 3.64*  HCT 36.6 34.0*  PLT 189 184   Recent Labs    01/28/21 1442 01/29/21 0512  NA  --  137  K  --  4.3  CL  --  104  CO2  --  25  BUN  --  13  CREATININE 0.70 0.69  GLUCOSE  --  147*  CALCIUM  --  8.4*   No results for input(s): LABPT, INR in the last 72 hours.  EXAM General - Patient is Alert, Appropriate and Oriented Extremity - Neurovascular intact Sensation intact distally Intact pulses distally Dorsiflexion/Plantar flexion intact No cellulitis present Compartment soft  Polar care unit adjusted this AM. Dressing - dressing C/D/I, Praveena intact with mild bloody drainage this AM. Motor Function - intact, moving foot and toes well on exam.  The patient ambulated 45 feet with most recent PT session.  Past Medical History:  Diagnosis Date  . Anxiety   . Arthritis    RA  . Borderline diabetes   . CHF (congestive heart failure) (HCC)   . COVID-19 11/2019  . Dyspnea    OFF AND ON SINCE COVID 2021  . GERD (gastroesophageal reflux disease)   . Goiter   . Heart murmur    not being  followed or treated. has lost weight with improvement of murmur  . History of blood transfusion    unsure of when she had transfusion but states she had a terrible reaction and needed medication  . Hypertension   . Hyperthyroidism   . Hypothyroidism    not on meds at this time  . Medical history non-contributory   . Pneumonia 11/2019  . Sleep apnea    does not use cpap since weight loss    Assessment/Plan:   3 Days Post-Op Procedure(s) (LRB): TOTAL KNEE ARTHROPLASTY (Right) Active Problems:   S/P TKR (total knee replacement) using cement, right  Estimated body mass index is 42.16 kg/m as calculated from the following:   Height as of this encounter: 5\' 3"  (1.6 m).   Weight as of this encounter: 108 kg. Advance diet Up with therapy   Patient with continued moderate pain.  Unable to tolerate bone foam. Up with therapy today, depending on progress will discharge either HHPT or SNF. Continue to work on BM, patient is passing gas without pain. Pain controlled.  Continue with current pain regimen  DVT Prophylaxis - Lovenox, TED hose and SCDs Weight-Bearing as tolerated to right leg  J , PA-C [facility] 01/31/2021, 7:47 AM

## 2021-01-31 NOTE — Progress Notes (Signed)
Occupational Therapy Treatment Patient Details Name: Alexandria Moore MRN: [id number] DOB: 1961-08-10 Today's Date: 01/31/2021    History of present illness Pt is a 60 yo F diagnosed with osteoarthritis of right knee and is s/p elective R TKA.  PMH includes: SOB, HTN, anxiety, CHF, L THA, and cervical fusion.   OT comments  Pt. Education was provided about A/E use for LE ADLs., and reviewed DME with the pt. And her daughter. Pt. continues to benefit from OT services for ADL training, A/E training, and pt. Education about home modification, and DME. Pt. Plans to return home upon discharge with family to assist pt. as needed. Pt. wouldbenefit from follow-up HHOT services upon discharge.   Follow Up Recommendations  Home health OT;Supervision - Intermittent    Equipment Recommendations  3 in 1 bedside commode;Tub/shower seat;Other (comment)    Recommendations for Other Services      Precautions / Restrictions Precautions Precautions: Fall Restrictions Weight Bearing Restrictions: Yes RLE Weight Bearing: Weight bearing as tolerated       Mobility Bed Mobility                   Transfers Overall transfer level: Needs assistance Equipment used: Rolling walker (2 wheeled) Transfers: Sit to/from Stand Sit to Stand: Min guard           Balance                             ADL either performed or assessed with clinical judgement   ADL Overall ADL's : Needs assistance/impaired                 Upper Body Dressing : Set up   Lower Body Dressing: Moderate assistance;Minimal assistance                 General ADL Comments: P     Vision Patient Visual Report: No change from baseline     Perception     Praxis      Cognition Arousal/Alertness: Awake/alert Behavior During Therapy: WFL for tasks assessed/performed Overall Cognitive Status: Within Functional Limits for tasks assessed                                           Exercises     Shoulder Instructions       General Comments      Pertinent Vitals/ Pain       Pain Assessment: 0-10 Pain Score: 6  Faces Pain Scale: Hurts even more Pain Location: R knee Pain Descriptors / Indicators: Aching;Sore Pain Intervention(s): Limited activity within patient's tolerance  Home Living                                          Prior Functioning/Environment              Frequency  Min 1X/week        Progress Toward Goals  OT Goals(current goals can now be found in the care plan section)  Progress towards OT goals: Progressing toward goals  Acute Rehab OT Goals Patient Stated Goal: go home when ready OT Goal Formulation: With patient Time For Goal Achievement: 02/13/21 Potential to Achieve Goals: Good  Plan  Co-evaluation                 AM-PAC OT "6 Clicks" Daily Activity     Outcome Measure   Help from another person eating meals?: None Help from another person taking care of personal grooming?: A Little Help from another person toileting, which includes using toliet, bedpan, or urinal?: A Little Help from another person bathing (including washing, rinsing, drying)?: A Little Help from another person to put on and taking off regular upper body clothing?: None Help from another person to put on and taking off regular lower body clothing?: A Lot 6 Click Score: 19    End of Session    OT Visit Diagnosis: Unsteadiness on feet (R26.81);Muscle weakness (generalized) (M62.81);Pain Pain - Right/Left: Right Pain - part of body: Knee   Activity Tolerance Patient tolerated treatment well;Patient limited by fatigue   Patient Left in bed;with call bell/phone within reach;with bed alarm set   Nurse Communication          Time: 1941-7408 OT Time Calculation (min): 15 min  Charges: OT Treatments $Self Care/Home Management : 8-22 mins  Olegario Messier, MS, OTR/L    Olegario Messier 01/31/2021, 1:52 PM

## 2021-02-01 MED ORDER — FLEET ENEMA 7-19 GM/118ML RE ENEM: 1.0000 | ENEMA | Freq: Every day | RECTAL | Status: DC | PRN

## 2021-02-01 NOTE — Progress Notes (Signed)
Subjective: 4 Days Post-Op Procedure(s) (LRB): TOTAL KNEE ARTHROPLASTY (Right) Patient reports pain as moderate but states her pain is much improved after changing her medications yesterday.   Patient is well, and has had no acute complaints or problems Denies any CP, SOB, ABD pain. We will continue therapy today.  Plan is for discharge home with HHPT today.  Objective: Vital signs in last 24 hours: Temp:  [98.3 F (36.8 C)-99.3 F (37.4 C)] 99.2 F (37.3 C) (04/23 0821) Pulse Rate:  [80-89] 88 (04/23 0821) Resp:  [16-18] 16 (04/23 0821) BP: (94-117)/(46-64) 94/46 (04/23 0821) SpO2:  [90 %-100 %] 100 % (04/23 0821)  Intake/Output from previous day: 04/22 0701 - 04/23 0700 In: 120 [P.O.:120] Out: -  Intake/Output this shift: No intake/output data recorded.  No results for input(s): HGB in the last 72 hours. No results for input(s): WBC, RBC, HCT, PLT in the last 72 hours. No results for input(s): NA, K, CL, CO2, BUN, CREATININE, GLUCOSE, CALCIUM in the last 72 hours. No results for input(s): LABPT, INR in the last 72 hours.  EXAM General - Patient is Alert, Appropriate and Oriented Extremity - Neurovascular intact Sensation intact distally Intact pulses distally Dorsiflexion/Plantar flexion intact No cellulitis present Compartment soft  Polar care unit adjusted this AM. Dressing - dressing C/D/I, Praveena intact with mild bloody drainage this AM. Motor Function - intact, moving foot and toes well on exam.  The patient ambulated 75 feet with most recent PT session.  Past Medical History:  Diagnosis Date  . Anxiety   . Arthritis    RA  . Borderline diabetes   . CHF (congestive heart failure) (HCC)   . COVID-19 11/2019  . Dyspnea    OFF AND ON SINCE COVID 2021  . GERD (gastroesophageal reflux disease)   . Goiter   . Heart murmur    not being followed or treated. has lost weight with improvement of murmur  . History of blood transfusion    unsure of when she had  transfusion but states she had a terrible reaction and needed medication  . Hypertension   . Hyperthyroidism   . Hypothyroidism    not on meds at this time  . Medical history non-contributory   . Pneumonia 11/2019  . Sleep apnea    does not use cpap since weight loss    Assessment/Plan:   4 Days Post-Op Procedure(s) (LRB): TOTAL KNEE ARTHROPLASTY (Right) Active Problems:   S/P TKR (total knee replacement) using cement, right   Total knee replacement status, right  Estimated body mass index is 42.16 kg/m as calculated from the following:   Height as of this encounter: 5\' 3"  (1.6 m).   Weight as of this encounter: 108 kg. Advance diet Up with therapy   Patient's pain is improved today, pain meds adjusted yesterday. Up with therapy today, plan for d/c home today after morning session of PT. Continue to work on BM, patient is passing gas without pain. FLEET enema ordered.  DVT Prophylaxis - Lovenox, TED hose and SCDs Weight-Bearing as tolerated to right leg  J , PA-C [facility] 02/01/2021, 9:52 AM

## 2021-02-01 NOTE — Progress Notes (Signed)
Patient being discharged home with daughter. Went over all discharge instructions with patient and daughter and they showed understanding. Took IV out of Right forearm without complication. Changed out wound vac to Prevena. Took all belongings home.

## 2021-02-01 NOTE — Progress Notes (Signed)
PT Cancellation Note  Patient Details Name: AERIKA GROLL MRN: [id number] DOB: [birth year]   Cancelled Treatment:     PT attempt. 2nd attempt this date. Pt currently sitting on BSC for BM. Will continue efforts to treat this date. Pt has passed all acute PT goals previous date and is cleared from PT standpoint for safe DC home with HHPT to follow.    Rushie Chestnut 02/01/2021, 10:56 AM

## 2021-09-29 ENCOUNTER — Other Ambulatory Visit: Payer: Self-pay | Admitting: Neurology

## 2021-09-29 DIAGNOSIS — Z981 Arthrodesis status: Secondary | ICD-10-CM

## 2021-10-15 ENCOUNTER — Ambulatory Visit
Admission: RE | Admit: 2021-10-15 | Discharge: 2021-10-15 | Disposition: A | Discharge status: Home / Self Care | Payer: BC Managed Care – PPO | Source: Ambulatory Visit | Attending: Neurology | Admitting: Neurology

## 2021-10-15 ENCOUNTER — Other Ambulatory Visit: Payer: Self-pay

## 2021-10-15 DIAGNOSIS — Z981 Arthrodesis status: Secondary | ICD-10-CM | POA: Diagnosis present

## 2021-12-05 ENCOUNTER — Telehealth: Payer: Self-pay

## 2021-12-05 ENCOUNTER — Other Ambulatory Visit: Payer: Self-pay

## 2021-12-05 DIAGNOSIS — Z1211 Encounter for screening for malignant neoplasm of colon: Secondary | ICD-10-CM

## 2021-12-05 MED ORDER — NA SULFATE-K SULFATE-MG SULF 17.5-3.13-1.6 GM/177ML PO SOLN: 1.0000 | Freq: Once | ORAL | 0 refills | Status: AC

## 2021-12-05 NOTE — Telephone Encounter (Signed)
CALLED PATIENT NO ANSWER LEFT VOICEMAIL FOR A CALL BACK ? ?

## 2021-12-05 NOTE — Progress Notes (Signed)
Gastroenterology Pre-Procedure Review  Request Date: 01/22/2022 Requesting Physician: Dr. Allegra LaiVanga   PATIENT REVIEW QUESTIONS: The patient responded to the following health history questions as indicated:    1. Are you having any GI issues? no 2. Do you have a personal history of Polyps? no 3. Do you have a family history of Colon Cancer or Polyps? yes (dad colon cancer) 4. Diabetes Mellitus? no 5. Joint replacements in the past 12 months?yes (knee in April 2022) 6. Major health problems in the past 3 months?no 7. Any artificial heart valves, MVP, or defibrillator?no    MEDICATIONS & ALLERGIES:    Patient reports the following regarding taking any anticoagulation/antiplatelet therapy:   Plavix, Coumadin, Eliquis, Xarelto, Lovenox, Pradaxa, Brilinta, or Effient? no Aspirin? no  Patient confirms/reports the following medications:  Current Outpatient Medications  Medication Sig Dispense Refill   amLODipine (NORVASC) 10 MG tablet Take 10 mg by mouth in the morning.     cholecalciferol (VITAMIN D) 25 MCG (1000 UNIT) tablet Take 2,000 Units by mouth in the morning.     enoxaparin (LOVENOX) 40 MG/0.4ML injection Inject 0.4 mLs (40 mg total) into the skin daily for 14 days. 5.6 mL 0   folic acid (FOLVITE) 1 MG tablet Take 1 mg by mouth in the morning.     furosemide (LASIX) 20 MG tablet Take 20 mg by mouth as needed.     gabapentin (NEURONTIN) 300 MG capsule Take 300 mg by mouth 3 (three) times daily as needed (nerve pain/nerve damage).     losartan (COZAAR) 100 MG tablet Take 100 mg by mouth in the morning.     methotrexate (RHEUMATREX) 2.5 MG tablet Take 15 mg by mouth every Sunday.     SYNTHROID 88 MCG tablet Take 88 mcg by mouth daily before breakfast.     No current facility-administered medications for this visit.    Patient confirms/reports the following allergies:  Allergies  Allergen Reactions   Hydromorphone Anaphylaxis and Shortness Of Breath   Lisinopril Anaphylaxis, Nausea  And Vomiting, Swelling and Other (See Comments)    LIP SWELLING   Meloxicam Anaphylaxis and Other (See Comments)    Weakness in Legs Other reaction(s): Other (See Comments) Weakness in Legs Weakness in Legs    Adhesive [Tape] Other (See Comments)    Irritates skin. (band-aids) Paper tape is okay    No orders of the defined types were placed in this encounter.   AUTHORIZATION INFORMATION Primary Insurance: 1D#: Group #:  Secondary Insurance: 1D#: Group #:  SCHEDULE INFORMATION: Date: 01/22/2022 Time: Location:[facility]

## 2021-12-11 ENCOUNTER — Other Ambulatory Visit: Payer: Self-pay | Admitting: Primary Care

## 2021-12-11 DIAGNOSIS — Z1231 Encounter for screening mammogram for malignant neoplasm of breast: Secondary | ICD-10-CM

## 2022-01-12 ENCOUNTER — Telehealth: Payer: Self-pay | Admitting: Gastroenterology

## 2022-01-12 NOTE — Telephone Encounter (Signed)
Patient would possibly like to reschedule, she is not sure. Requesting call back.  ?

## 2022-01-13 NOTE — Telephone Encounter (Signed)
Patient called ENDO and Alexandria Moore transferred me the call. Moved patient to 03/12/22. Mailed new instructions  ?

## 2022-02-25 ENCOUNTER — Ambulatory Visit
Admission: RE | Admit: 2022-02-25 | Discharge: 2022-02-25 | Disposition: A | Discharge status: Home / Self Care | Payer: BC Managed Care – PPO | Source: Ambulatory Visit | Attending: Primary Care | Admitting: Primary Care

## 2022-02-25 DIAGNOSIS — Z1231 Encounter for screening mammogram for malignant neoplasm of breast: Secondary | ICD-10-CM

## 2022-03-05 ENCOUNTER — Telehealth: Payer: Self-pay | Admitting: Gastroenterology

## 2022-03-05 NOTE — Telephone Encounter (Signed)
PT has questions about upcoming procedure on 03/12/2022. She would like a call back

## 2022-03-12 ENCOUNTER — Encounter: Payer: Self-pay | Admitting: Gastroenterology

## 2022-03-12 ENCOUNTER — Encounter
Admission: RE | Disposition: A | Discharge status: Home / Self Care | Payer: Self-pay | Source: Home / Self Care | Attending: Gastroenterology

## 2022-03-12 ENCOUNTER — Ambulatory Visit
Admission: RE | Admit: 2022-03-12 | Discharge: 2022-03-12 | Disposition: A | Discharge status: Home / Self Care | Payer: BC Managed Care – PPO | Attending: Gastroenterology | Admitting: Gastroenterology

## 2022-03-12 ENCOUNTER — Ambulatory Visit: Payer: BC Managed Care – PPO | Admitting: Certified Registered Nurse Anesthetist

## 2022-03-12 DIAGNOSIS — Z96653 Presence of artificial knee joint, bilateral: Secondary | ICD-10-CM | POA: Insufficient documentation

## 2022-03-12 DIAGNOSIS — I110 Hypertensive heart disease with heart failure: Secondary | ICD-10-CM | POA: Diagnosis not present

## 2022-03-12 DIAGNOSIS — Z87891 Personal history of nicotine dependence: Secondary | ICD-10-CM | POA: Diagnosis not present

## 2022-03-12 DIAGNOSIS — K219 Gastro-esophageal reflux disease without esophagitis: Secondary | ICD-10-CM | POA: Diagnosis not present

## 2022-03-12 DIAGNOSIS — Z8616 Personal history of COVID-19: Secondary | ICD-10-CM | POA: Insufficient documentation

## 2022-03-12 DIAGNOSIS — Z96642 Presence of left artificial hip joint: Secondary | ICD-10-CM | POA: Diagnosis not present

## 2022-03-12 DIAGNOSIS — Z800 Family history of malignant neoplasm of digestive organs: Secondary | ICD-10-CM | POA: Insufficient documentation

## 2022-03-12 DIAGNOSIS — E039 Hypothyroidism, unspecified: Secondary | ICD-10-CM | POA: Insufficient documentation

## 2022-03-12 DIAGNOSIS — Z1211 Encounter for screening for malignant neoplasm of colon: Secondary | ICD-10-CM | POA: Insufficient documentation

## 2022-03-12 DIAGNOSIS — I509 Heart failure, unspecified: Secondary | ICD-10-CM | POA: Insufficient documentation

## 2022-03-12 DIAGNOSIS — G4730 Sleep apnea, unspecified: Secondary | ICD-10-CM | POA: Insufficient documentation

## 2022-03-12 DIAGNOSIS — K635 Polyp of colon: Secondary | ICD-10-CM | POA: Insufficient documentation

## 2022-03-12 HISTORY — PX: COLONOSCOPY WITH PROPOFOL: SHX5780

## 2022-03-12 SURGERY — COLONOSCOPY WITH PROPOFOL
Anesthesia: General

## 2022-03-12 MED ORDER — LIDOCAINE HCL (CARDIAC) PF 100 MG/5ML IV SOSY
PREFILLED_SYRINGE | INTRAVENOUS | Status: DC | PRN
  Administered 2022-03-12: 50 mg via INTRAVENOUS

## 2022-03-12 MED ORDER — PROPOFOL 500 MG/50ML IV EMUL
INTRAVENOUS | Status: DC | PRN
  Administered 2022-03-12: 150 ug/kg/min via INTRAVENOUS

## 2022-03-12 MED ORDER — SODIUM CHLORIDE 0.9 % IV SOLN: INTRAVENOUS | Status: DC

## 2022-03-12 MED ORDER — PROPOFOL 500 MG/50ML IV EMUL
INTRAVENOUS | Status: AC
  Filled 2022-03-12: qty 50

## 2022-03-12 MED ORDER — PROPOFOL 10 MG/ML IV BOLUS
INTRAVENOUS | Status: DC | PRN
  Administered 2022-03-12: 80 mg via INTRAVENOUS
  Administered 2022-03-12: 20 mg via INTRAVENOUS

## 2022-03-12 NOTE — Transfer of Care (Signed)
Immediate Anesthesia Transfer of Care Note  Patient: Alexandria Moore  Procedure(s) Performed: COLONOSCOPY WITH PROPOFOL  Patient Location: Endoscopy Unit  Anesthesia Type:General  Level of Consciousness: awake and alert   Airway & Oxygen Therapy: Patient Spontanous Breathing  Post-op Assessment: Report given to RN and Post -op Vital signs reviewed and stable  Post vital signs: Reviewed and stable  Last Vitals:  Vitals Value Taken Time  BP 104/64 03/12/22 0842  Temp 36.2 C 03/12/22 0841  Pulse 54 03/12/22 0842  Resp 18 03/12/22 0842  SpO2 100 % 03/12/22 0842  Vitals shown include unvalidated device data.  Last Pain:  Vitals:   03/12/22 0841  TempSrc: Temporal  PainSc: 0-No pain         Complications: No notable events documented.

## 2022-03-12 NOTE — Anesthesia Procedure Notes (Signed)
Date/Time: 03/12/2022 8:21 AM Performed by: Ginger Carne, CRNA Pre-anesthesia Checklist: Patient identified, Emergency Drugs available, Suction available, Patient being monitored and Timeout performed Patient Re-evaluated:Patient Re-evaluated prior to induction Oxygen Delivery Method: Nasal cannula Preoxygenation: Pre-oxygenation with 100% oxygen Induction Type: IV induction

## 2022-03-12 NOTE — H&P (Signed)
Arlyss Repress, MD [address]  [address]  [place], [place] [address]  Main: [phone]  Fax: [phone] Pager: [phone]  Primary Care Physician:  Sandrea Hughs, NP Primary Gastroenterologist:  Dr. Arlyss Repress  Pre-Procedure History & Physical: HPI:  Alexandria Moore is a 61 y.o. female is here for an colonoscopy.   Past Medical History:  Diagnosis Date   Anxiety    Arthritis    RA   Borderline diabetes    CHF (congestive heart failure) (HCC)    COVID-19 11/2019   Dyspnea    OFF AND ON SINCE COVID 2021   GERD (gastroesophageal reflux disease)    Goiter    Heart murmur    not being followed or treated. has lost weight with improvement of murmur   History of blood transfusion    unsure of when she had transfusion but states she had a terrible reaction and needed medication   Hypertension    Hyperthyroidism    Hypothyroidism    not on meds at this time   Medical history non-contributory    Pneumonia 11/2019   Sleep apnea    does not use cpap since weight loss    Past Surgical History:  Procedure Laterality Date   ANTERIOR CERVICAL DECOMP/DISCECTOMY FUSION N/A 01/18/2020   Procedure: ANTERIOR CERVICAL DECOMPRESSION/DISCECTOMY FUSION 1 LEVEL;  Surgeon: Venetia Night, MD;  Location: [facility];  Service: Neurosurgery;  Laterality: N/A;  C4-5   CESAREAN SECTION  1982   x 1   TOTAL HIP ARTHROPLASTY Left 05/12/2018   Procedure: TOTAL HIP ARTHROPLASTY ANTERIOR APPROACH;  Surgeon: Kennedy Bucker, MD;  Location: [facility];  Service: Orthopedics;  Laterality: Left;   TOTAL KNEE ARTHROPLASTY Right 01/28/2021   Procedure: TOTAL KNEE ARTHROPLASTY;  Surgeon: Kennedy Bucker, MD;  Location: [facility];  Service: Orthopedics;  Laterality: Right;    Prior to Admission medications   Medication Sig Start Date End Date Taking? Authorizing Provider  amLODipine (NORVASC) 10 MG tablet Take 10 mg by mouth in the morning.    [provider]  cholecalciferol (VITAMIN  D) 25 MCG (1000 UNIT) tablet Take 2,000 Units by mouth in the morning.    [provider]  enoxaparin (LOVENOX) 40 MG/0.4ML injection Inject 0.4 mLs (40 mg total) into the skin daily for 14 days. 01/31/21 12/05/21  Anson Oregon, PA-C  folic acid (FOLVITE) 1 MG tablet Take 1 mg by mouth in the morning. 01/01/21   [provider]  furosemide (LASIX) 20 MG tablet Take 20 mg by mouth as needed. 01/01/21   [provider]  gabapentin (NEURONTIN) 300 MG capsule Take 300 mg by mouth 3 (three) times daily as needed (nerve pain/nerve damage). 11/06/19   [provider]  losartan (COZAAR) 100 MG tablet Take 100 mg by mouth in the morning.    [provider]  methotrexate (RHEUMATREX) 2.5 MG tablet Take 15 mg by mouth every Sunday. 01/02/21   [provider]  SYNTHROID 88 MCG tablet Take 88 mcg by mouth daily before breakfast. 06/16/19   [provider]    Allergies as of 12/05/2021 - Review Complete 12/05/2021  Allergen Reaction Noted   Hydromorphone Anaphylaxis and Shortness Of Breath 06/03/2016   Lisinopril Anaphylaxis, Nausea And Vomiting, Swelling, and Other (See Comments) 06/03/2016   Meloxicam Anaphylaxis and Other (See Comments) 06/03/2016   Adhesive [tape] Other (See Comments) 05/04/2018    Family History  Problem Relation Age of Onset   Breast cancer Neg Hx  Social History   Socioeconomic History   Marital status: Single    Spouse name: Not on file   Number of children: 2   Years of education: 12   Highest education level: High school graduate  Occupational History    Employer: [employer]  Tobacco Use   Smoking status: Former    Packs/day: 0.25    Types: Cigarettes    Quit date: 2014    Years since quitting: 9.4   Smokeless tobacco: Never   Tobacco comments:    SMOKED LESS THAN A YEAR   Vaping Use   Vaping Use: Never used  Substance and Sexual Activity   Alcohol use: Yes    Comment: OCC WINE    Drug use: Never   Sexual activity: Yes    Birth control/protection: Post-menopausal  Other Topics Concern   Not on file  Social History Narrative   Not on file   Social Determinants of Health   Financial Resource Strain: Not on file  Food Insecurity: Not on file  Transportation Needs: Not on file  Physical Activity: Not on file  Stress: Not on file  Social Connections: Not on file  Intimate Partner Violence: Not on file    Review of Systems: See HPI, otherwise negative ROS  Physical Exam: BP (!) 151/96   Pulse 63   Temp 97.7 F (36.5 C) (Temporal)   Resp 16   Ht  (1.626 m)   Wt 105.2 kg   LMP 01/17/2015   SpO2 100%   BMI 39.82 kg/m  General:   Alert,  pleasant and cooperative in NAD Head:  Normocephalic and atraumatic. Neck:  Supple; no masses or thyromegaly. Lungs:  Clear throughout to auscultation.    Heart:  Regular rate and rhythm. Abdomen:  Soft, nontender and nondistended. Normal bowel sounds, without guarding, and without rebound.   Neurologic:  Alert and  oriented x4;  grossly normal neurologically.  Impression/Plan: Alexandria Moore is here for an colonoscopy to be performed for colon cancer screening  Risks, benefits, limitations, and alternatives regarding  colonoscopy have been reviewed with the patient.  Questions have been answered.  All parties agreeable.   Lannette Donath, MD  03/12/2022, 8:04 AM

## 2022-03-12 NOTE — Anesthesia Preprocedure Evaluation (Signed)
Anesthesia Evaluation  Patient identified by MRN, date of birth, ID band Patient awake    Reviewed: Allergy & Precautions, NPO status , Patient's Chart, lab work & pertinent test results  History of Anesthesia Complications Negative for: history of anesthetic complications  Airway Mallampati: III  TM Distance: >3 FB Neck ROM: full    Dental  (+) Chipped   Pulmonary shortness of breath, sleep apnea , former smoker,    Pulmonary exam normal        Cardiovascular Exercise Tolerance: Good hypertension, (-) angina+CHF  (-) Past MI Normal cardiovascular exam     Neuro/Psych PSYCHIATRIC DISORDERS negative neurological ROS     GI/Hepatic Neg liver ROS, GERD  Controlled,  Endo/Other    Renal/GU negative Renal ROS  negative genitourinary   Musculoskeletal   Abdominal   Peds  Hematology negative hematology ROS (+)   Anesthesia Other Findings Past Medical History: No date: Anxiety No date: Arthritis     Comment:  RA No date: Borderline diabetes No date: CHF (congestive heart failure) (HCC) 11/2019: COVID-19 No date: Dyspnea     Comment:  OFF AND ON SINCE COVID 2021 No date: GERD (gastroesophageal reflux disease) No date: Goiter No date: Heart murmur     Comment:  not being followed or treated. has lost weight with               improvement of murmur No date: History of blood transfusion     Comment:  unsure of when she had transfusion but states she had a               terrible reaction and needed medication No date: Hypertension No date: Hyperthyroidism No date: Hypothyroidism     Comment:  not on meds at this time No date: Medical history non-contributory 11/2019: Pneumonia No date: Sleep apnea     Comment:  does not use cpap since weight loss  Past Surgical History: 01/18/2020: ANTERIOR CERVICAL DECOMP/DISCECTOMY FUSION; N/A     Comment:  Procedure: ANTERIOR CERVICAL DECOMPRESSION/DISCECTOMY                FUSION 1 LEVEL;  Surgeon: Venetia Night, MD;                Location: [facility];  Service: Neurosurgery;  Laterality:               N/A;  C4-5 1982: CESAREAN SECTION     Comment:  x 1 05/12/2018: TOTAL HIP ARTHROPLASTY; Left     Comment:  Procedure: TOTAL HIP ARTHROPLASTY ANTERIOR APPROACH;                Surgeon: Kennedy Bucker, MD;  Location: [facility];                Service: Orthopedics;  Laterality: Left; 01/28/2021: TOTAL KNEE ARTHROPLASTY; Right     Comment:  Procedure: TOTAL KNEE ARTHROPLASTY;  Surgeon: Kennedy Bucker, MD;  Location: [facility];  Service: Orthopedics;               Laterality: Right;  BMI    Body Mass Index: 39.82 kg/m      Reproductive/Obstetrics negative OB ROS                             Anesthesia Physical Anesthesia Plan  ASA: 3  Anesthesia Plan: General  Post-op Pain Management:    Induction: Intravenous  PONV Risk Score and Plan: Propofol infusion and TIVA  Airway Management Planned: Natural Airway and Nasal Cannula  Additional Equipment:   Intra-op Plan:   Post-operative Plan:   Informed Consent: I have reviewed the patients History and Physical, chart, labs and discussed the procedure including the risks, benefits and alternatives for the proposed anesthesia with the patient or authorized representative who has indicated his/her understanding and acceptance.     Dental Advisory Given  Plan Discussed with: Anesthesiologist, CRNA and Surgeon  Anesthesia Plan Comments: (Patient consented for risks of anesthesia including but not limited to:  - adverse reactions to medications - risk of airway placement if required - damage to eyes, teeth, lips or other oral mucosa - nerve damage due to positioning  - sore throat or hoarseness - Damage to heart, brain, nerves, lungs, other parts of body or loss of life  Patient voiced understanding.)        Anesthesia Quick Evaluation

## 2022-03-12 NOTE — Anesthesia Postprocedure Evaluation (Signed)
Anesthesia Post Note  Patient: Alexandria Moore  Procedure(s) Performed: COLONOSCOPY WITH PROPOFOL  Patient location during evaluation: Endoscopy Anesthesia Type: General Level of consciousness: awake and alert Pain management: pain level controlled Vital Signs Assessment: post-procedure vital signs reviewed and stable Respiratory status: spontaneous breathing, nonlabored ventilation, respiratory function stable and patient connected to nasal cannula oxygen Cardiovascular status: blood pressure returned to baseline and stable Postop Assessment: no apparent nausea or vomiting Anesthetic complications: no   No notable events documented.   Last Vitals:  Vitals:   03/12/22 0851 03/12/22 0901  BP: 123/82 (!) 135/99  Pulse: (!) 51 (!) 56  Resp: 13 15  Temp:    SpO2:  93%    Last Pain:  Vitals:   03/12/22 0901  TempSrc:   PainSc: 0-No pain                 Cleda Mccreedy Jayln Branscom

## 2022-03-12 NOTE — Op Note (Signed)
[facility] Gastroenterology Patient Name: Alexandria Moore Procedure Date: 03/12/2022 8:08 AM MRN: [id number] Account #: [id number] Date of Birth: 03-16-1961 Admit Type: Outpatient Age: 61 Room: [facility] ENDO ROOM 2 Gender: Female Note Status: Finalized Instrument Name: Jasper Riling [id number] Procedure:             Colonoscopy Indications:           Screening in patient at increased risk: Colorectal                         cancer in father before age 22, Last colonoscopy:                         March 2009, Last colonoscopy 10 years ago Providers:             Lin Landsman MD, MD Referring MD:          Lin Landsman MD, MD (Referring MD) Medicines:             General Anesthesia Complications:         No immediate complications. Estimated blood loss: None. Procedure:             Pre-Anesthesia Assessment:                        - Prior to the procedure, a History and Physical was                         performed, and patient medications and allergies were                         reviewed. The patient is competent. The risks and                         benefits of the procedure and the sedation options and                         risks were discussed with the patient. All questions                         were answered and informed consent was obtained.                         Patient identification and proposed procedure were                         verified by the physician, the nurse, the                         anesthesiologist, the anesthetist and the technician                         in the pre-procedure area in the procedure room in the                         endoscopy suite. Mental Status Examination: alert and                         oriented. Airway Examination: normal oropharyngeal  airway and neck mobility. Respiratory Examination:                         clear to auscultation. CV Examination: normal.                          Prophylactic Antibiotics: The patient does not require                         prophylactic antibiotics. Prior Anticoagulants: The                         patient has taken no previous anticoagulant or                         antiplatelet agents. ASA Grade Assessment: III - A                         patient with severe systemic disease. After reviewing                         the risks and benefits, the patient was deemed in                         satisfactory condition to undergo the procedure. The                         anesthesia plan was to use general anesthesia.                         Immediately prior to administration of medications,                         the patient was re-assessed for adequacy to receive                         sedatives. The heart rate, respiratory rate, oxygen                         saturations, blood pressure, adequacy of pulmonary                         ventilation, and response to care were monitored                         throughout the procedure. The physical status of the                         patient was re-assessed after the procedure.                        After obtaining informed consent, the colonoscope was                         passed under direct vision. Throughout the procedure,                         the patient's blood pressure, pulse, and oxygen  saturations were monitored continuously. The                         Colonoscope was introduced through the anus and                         advanced to the the terminal ileum, with                         identification of the appendiceal orifice and IC                         valve. The colonoscopy was performed without                         difficulty. The patient tolerated the procedure well.                         The quality of the bowel preparation was evaluated                         using the BBPS [facility] Bowel Preparation Scale) with                          scores of: Right Colon = 3, Transverse Colon = 3 and                         Left Colon = 3 (entire mucosa seen well with no                         residual staining, small fragments of stool or opaque                         liquid). The total BBPS score equals 9. Findings:      The perianal and digital rectal examinations were normal. Pertinent       negatives include normal sphincter tone and no palpable rectal lesions.      The terminal ileum appeared normal.      A diminutive polyp was found in the cecum. The polyp was sessile. The       polyp was removed with a cold biopsy forceps. Resection and retrieval       were complete. Estimated blood loss: none.      The retroflexed view of the distal rectum and anal verge was normal and       showed no anal or rectal abnormalities.      The exam was otherwise without abnormality. Impression:            - The examined portion of the ileum was normal.                        - One diminutive polyp in the cecum, removed with a                         cold biopsy forceps. Resected and retrieved.                        - The distal rectum and anal verge are  normal on                         retroflexion view.                        - The examination was otherwise normal. Recommendation:        - Discharge patient to home (with escort).                        - Resume previous diet today.                        - Continue present medications.                        - Await pathology results.                        - Repeat colonoscopy in 5 years for surveillance. Procedure Code(s):     --- Professional ---                        [phone], Colonoscopy, flexible; with biopsy, single or                         multiple Diagnosis Code(s):     --- Professional ---                        K63.5, Polyp of colon                        Z80.0, Family history of malignant neoplasm of                         digestive organs CPT copyright 2019 American  Medical Association. All rights reserved. The codes documented in this report are preliminary and upon coder review may  be revised to meet current compliance requirements. Dr. Ulyess Mort Lin Landsman MD, MD 03/12/2022 8:39:49 AM This report has been signed electronically. Number of Addenda: 0 Note Initiated On: 03/12/2022 8:08 AM Scope Withdrawal Time: 0 hours 8 minutes 40 seconds  Total Procedure Duration: 0 hours 11 minutes 6 seconds  Estimated Blood Loss:  Estimated blood loss: none.      [facility]

## 2022-03-13 ENCOUNTER — Encounter: Payer: Self-pay | Admitting: Gastroenterology

## 2022-03-13 LAB — SURGICAL PATHOLOGY

## 2022-03-14 ENCOUNTER — Encounter: Payer: Self-pay | Admitting: Gastroenterology

## 2022-06-08 ENCOUNTER — Encounter: Payer: Self-pay | Admitting: Emergency Medicine

## 2022-06-08 ENCOUNTER — Emergency Department: Payer: No Typology Code available for payment source

## 2022-06-08 ENCOUNTER — Inpatient Hospital Stay
Admission: EM | Admit: 2022-06-08 | Discharge: 2022-06-12 | DRG: 193 | Disposition: A | Discharge status: Home / Self Care | Payer: No Typology Code available for payment source | Attending: Internal Medicine | Admitting: Internal Medicine

## 2022-06-08 ENCOUNTER — Other Ambulatory Visit: Payer: Self-pay

## 2022-06-08 DIAGNOSIS — F419 Anxiety disorder, unspecified: Secondary | ICD-10-CM | POA: Diagnosis present

## 2022-06-08 DIAGNOSIS — Z79899 Other long term (current) drug therapy: Secondary | ICD-10-CM

## 2022-06-08 DIAGNOSIS — M069 Rheumatoid arthritis, unspecified: Secondary | ICD-10-CM

## 2022-06-08 DIAGNOSIS — G8929 Other chronic pain: Secondary | ICD-10-CM | POA: Diagnosis present

## 2022-06-08 DIAGNOSIS — Z981 Arthrodesis status: Secondary | ICD-10-CM

## 2022-06-08 DIAGNOSIS — E039 Hypothyroidism, unspecified: Secondary | ICD-10-CM | POA: Diagnosis present

## 2022-06-08 DIAGNOSIS — J9600 Acute respiratory failure, unspecified whether with hypoxia or hypercapnia: Secondary | ICD-10-CM | POA: Diagnosis present

## 2022-06-08 DIAGNOSIS — J189 Pneumonia, unspecified organism: Secondary | ICD-10-CM | POA: Diagnosis present

## 2022-06-08 DIAGNOSIS — K219 Gastro-esophageal reflux disease without esophagitis: Secondary | ICD-10-CM | POA: Diagnosis present

## 2022-06-08 DIAGNOSIS — Z96642 Presence of left artificial hip joint: Secondary | ICD-10-CM | POA: Diagnosis present

## 2022-06-08 DIAGNOSIS — Z79631 Long term (current) use of antimetabolite agent: Secondary | ICD-10-CM

## 2022-06-08 DIAGNOSIS — Z96651 Presence of right artificial knee joint: Secondary | ICD-10-CM | POA: Diagnosis present

## 2022-06-08 DIAGNOSIS — Z8616 Personal history of COVID-19: Secondary | ICD-10-CM | POA: Diagnosis not present

## 2022-06-08 DIAGNOSIS — Z8249 Family history of ischemic heart disease and other diseases of the circulatory system: Secondary | ICD-10-CM | POA: Diagnosis not present

## 2022-06-08 DIAGNOSIS — D849 Immunodeficiency, unspecified: Secondary | ICD-10-CM

## 2022-06-08 DIAGNOSIS — I480 Paroxysmal atrial fibrillation: Secondary | ICD-10-CM | POA: Diagnosis present

## 2022-06-08 DIAGNOSIS — Z20822 Contact with and (suspected) exposure to covid-19: Secondary | ICD-10-CM | POA: Diagnosis present

## 2022-06-08 DIAGNOSIS — Z87891 Personal history of nicotine dependence: Secondary | ICD-10-CM | POA: Diagnosis not present

## 2022-06-08 DIAGNOSIS — M1990 Unspecified osteoarthritis, unspecified site: Secondary | ICD-10-CM | POA: Diagnosis present

## 2022-06-08 DIAGNOSIS — J9601 Acute respiratory failure with hypoxia: Secondary | ICD-10-CM | POA: Diagnosis present

## 2022-06-08 DIAGNOSIS — I10 Essential (primary) hypertension: Secondary | ICD-10-CM | POA: Diagnosis present

## 2022-06-08 DIAGNOSIS — Z825 Family history of asthma and other chronic lower respiratory diseases: Secondary | ICD-10-CM | POA: Diagnosis not present

## 2022-06-08 DIAGNOSIS — J188 Other pneumonia, unspecified organism: Secondary | ICD-10-CM

## 2022-06-08 DIAGNOSIS — G4733 Obstructive sleep apnea (adult) (pediatric): Secondary | ICD-10-CM | POA: Diagnosis present

## 2022-06-08 DIAGNOSIS — Z888 Allergy status to other drugs, medicaments and biological substances status: Secondary | ICD-10-CM

## 2022-06-08 DIAGNOSIS — R0902 Hypoxemia: Secondary | ICD-10-CM

## 2022-06-08 DIAGNOSIS — Z6841 Body Mass Index (BMI) 40.0 and over, adult: Secondary | ICD-10-CM

## 2022-06-08 DIAGNOSIS — Z79890 Hormone replacement therapy (postmenopausal): Secondary | ICD-10-CM

## 2022-06-08 DIAGNOSIS — R0600 Dyspnea, unspecified: Principal | ICD-10-CM

## 2022-06-08 LAB — URINALYSIS, COMPLETE (UACMP) WITH MICROSCOPIC
Bacteria, UA: NONE SEEN
Bilirubin Urine: NEGATIVE
Glucose, UA: NEGATIVE mg/dL
Hgb urine dipstick: NEGATIVE
Ketones, ur: 20 mg/dL — AB
Leukocytes,Ua: NEGATIVE
Nitrite: NEGATIVE
Protein, ur: NEGATIVE mg/dL
Specific Gravity, Urine: 1.034 — ABNORMAL HIGH (ref 1.005–1.030)
pH: 6 (ref 5.0–8.0)

## 2022-06-08 LAB — CBC
HCT: 39.2 % (ref 36.0–46.0)
Hemoglobin: 12.9 g/dL (ref 12.0–15.0)
MCH: 30.9 pg (ref 26.0–34.0)
MCHC: 32.9 g/dL (ref 30.0–36.0)
MCV: 94 fL (ref 80.0–100.0)
Platelets: 247 10*3/uL (ref 150–400)
RBC: 4.17 MIL/uL (ref 3.87–5.11)
RDW: 14.9 % (ref 11.5–15.5)
WBC: 14.9 10*3/uL — ABNORMAL HIGH (ref 4.0–10.5)
nRBC: 0 % (ref 0.0–0.2)

## 2022-06-08 LAB — TSH: TSH: 4.545 u[IU]/mL — ABNORMAL HIGH (ref 0.350–4.500)

## 2022-06-08 LAB — BASIC METABOLIC PANEL
Anion gap: 9 (ref 5–15)
BUN: 13 mg/dL (ref 8–23)
CO2: 24 mmol/L (ref 22–32)
Calcium: 8.9 mg/dL (ref 8.9–10.3)
Chloride: 104 mmol/L (ref 98–111)
Creatinine, Ser: 0.69 mg/dL (ref 0.44–1.00)
GFR, Estimated: >60 mL/min (ref >60)
Glucose, Bld: 119 mg/dL — ABNORMAL HIGH (ref 70–99)
Potassium: 4.1 mmol/L (ref 3.5–5.1)
Sodium: 137 mmol/L (ref 135–145)

## 2022-06-08 LAB — RESP PANEL BY RT-PCR (FLU A&B, COVID) ARPGX2
Influenza A by PCR: NEGATIVE
Influenza B by PCR: NEGATIVE
SARS Coronavirus 2 by RT PCR: NEGATIVE

## 2022-06-08 LAB — LACTIC ACID, PLASMA
Lactic Acid, Venous: 1.6 mmol/L (ref 0.5–1.9)
Lactic Acid, Venous: 1.3 mmol/L (ref 0.5–1.9)

## 2022-06-08 LAB — PROTIME-INR
INR: 1 (ref 0.8–1.2)
Prothrombin Time: 13.5 seconds (ref 11.4–15.2)

## 2022-06-08 LAB — BLOOD GAS, ARTERIAL
Acid-base deficit: 1.6 mmol/L (ref 0.0–2.0)
Bicarbonate: 21.5 mmol/L (ref 20.0–28.0)
FIO2: 0.4 %
O2 Saturation: 89.9 %
Patient temperature: 37
pCO2 arterial: 31 mmHg — ABNORMAL LOW (ref 32–48)
pH, Arterial: 7.45 (ref 7.35–7.45)
pO2, Arterial: 56 mmHg — ABNORMAL LOW (ref 83–108)

## 2022-06-08 LAB — GLUCOSE, CAPILLARY: Glucose-Capillary: 93 mg/dL (ref 70–99)

## 2022-06-08 LAB — MRSA NEXT GEN BY PCR, NASAL: MRSA by PCR Next Gen: NOT DETECTED

## 2022-06-08 LAB — PROCALCITONIN: Procalcitonin: <0.1 ng/mL

## 2022-06-08 LAB — APTT: aPTT: 24 seconds (ref 24–36)

## 2022-06-08 LAB — BRAIN NATRIURETIC PEPTIDE: B Natriuretic Peptide: 529.4 pg/mL — ABNORMAL HIGH (ref 0.0–100.0)

## 2022-06-08 LAB — TROPONIN I (HIGH SENSITIVITY): Troponin I (High Sensitivity): 10 ng/L (ref <18)

## 2022-06-08 MED ORDER — ONDANSETRON HCL 4 MG PO TABS: 4.0000 mg | ORAL_TABLET | Freq: Four times a day (QID) | ORAL | Status: DC | PRN

## 2022-06-08 MED ORDER — CHLORHEXIDINE GLUCONATE CLOTH 2 % EX PADS
6.0000 | MEDICATED_PAD | Freq: Every day | CUTANEOUS | Status: DC
  Administered 2022-06-08 – 2022-06-12 (×5): 6 via TOPICAL

## 2022-06-08 MED ORDER — APIXABAN 5 MG PO TABS
5.0000 mg | ORAL_TABLET | Freq: Two times a day (BID) | ORAL | Status: DC
  Administered 2022-06-08 – 2022-06-12 (×8): 5 mg via ORAL
  Filled 2022-06-08 (×8): qty 1

## 2022-06-08 MED ORDER — LACTATED RINGERS IV SOLN: INTRAVENOUS | Status: DC

## 2022-06-08 MED ORDER — SODIUM CHLORIDE 0.9 % IV BOLUS (SEPSIS)
1000.0000 mL | Freq: Once | INTRAVENOUS | Status: AC
  Administered 2022-06-08: 1000 mL via INTRAVENOUS

## 2022-06-08 MED ORDER — ONDANSETRON HCL 4 MG/2ML IJ SOLN: 4.0000 mg | Freq: Four times a day (QID) | INTRAMUSCULAR | Status: DC | PRN

## 2022-06-08 MED ORDER — SODIUM CHLORIDE 0.9 % IV SOLN: 2.0000 g | INTRAVENOUS | Status: DC

## 2022-06-08 MED ORDER — DILTIAZEM HCL 25 MG/5ML IV SOLN
15.0000 mg | Freq: Once | INTRAVENOUS | Status: AC
  Administered 2022-06-08: 15 mg via INTRAVENOUS
  Filled 2022-06-08: qty 5

## 2022-06-08 MED ORDER — ACETAMINOPHEN 650 MG RE SUPP: 650.0000 mg | Freq: Four times a day (QID) | RECTAL | Status: DC | PRN

## 2022-06-08 MED ORDER — DILTIAZEM HCL-DEXTROSE 125-5 MG/125ML-% IV SOLN (PREMIX)
5.0000 mg/h | INTRAVENOUS | Status: DC
  Administered 2022-06-08: 5 mg/h via INTRAVENOUS
  Filled 2022-06-08: qty 125

## 2022-06-08 MED ORDER — DILTIAZEM HCL 25 MG/5ML IV SOLN
10.0000 mg | Freq: Once | INTRAVENOUS | Status: AC
  Administered 2022-06-08: 10 mg via INTRAVENOUS
  Filled 2022-06-08: qty 5

## 2022-06-08 MED ORDER — SODIUM CHLORIDE 0.9 % IV SOLN
500.0000 mg | INTRAVENOUS | Status: DC
  Administered 2022-06-08 – 2022-06-10 (×3): 500 mg via INTRAVENOUS
  Filled 2022-06-08 (×4): qty 5

## 2022-06-08 MED ORDER — IOHEXOL 350 MG/ML SOLN
75.0000 mL | Freq: Once | INTRAVENOUS | Status: AC | PRN
Start: 2022-06-08 — End: 2022-06-08
  Administered 2022-06-08: 75 mL via INTRAVENOUS

## 2022-06-08 MED ORDER — LEVALBUTEROL HCL 0.63 MG/3ML IN NEBU: 0.6300 mg | INHALATION_SOLUTION | Freq: Four times a day (QID) | RESPIRATORY_TRACT | Status: DC | PRN

## 2022-06-08 MED ORDER — ACETAMINOPHEN 325 MG PO TABS
650.0000 mg | ORAL_TABLET | Freq: Four times a day (QID) | ORAL | Status: DC | PRN
  Administered 2022-06-08 – 2022-06-09 (×2): 650 mg via ORAL
  Filled 2022-06-08 (×3): qty 2

## 2022-06-08 MED ORDER — METOPROLOL TARTRATE 25 MG PO TABS
25.0000 mg | ORAL_TABLET | Freq: Two times a day (BID) | ORAL | Status: DC
  Administered 2022-06-08: 25 mg via ORAL
  Filled 2022-06-08: qty 1

## 2022-06-08 MED ORDER — SODIUM CHLORIDE 0.9 % IV SOLN
2.0000 g | INTRAVENOUS | Status: DC
  Administered 2022-06-08 – 2022-06-11 (×4): 2 g via INTRAVENOUS
  Filled 2022-06-08 (×5): qty 20

## 2022-06-08 MED ORDER — ORAL CARE MOUTH RINSE: 15.0000 mL | OROMUCOSAL | Status: DC | PRN

## 2022-06-08 MED ORDER — SODIUM CHLORIDE 0.9 % IV SOLN: 500.0000 mg | INTRAVENOUS | Status: DC

## 2022-06-08 NOTE — H&P (Signed)
History and Physical    Alexandria Moore RXY:[id number] DOB: 15-Oct-1960 DOA: 06/08/2022  PCP: Sandrea Hughs, NP  Patient coming from: home  I have personally briefly reviewed patient's old medical records in [facility]  Chief Complaint: sob that acute started am PTA  HPI: Alexandria Moore is a 61 y.o. female with medical history significant of  RA on MTX, GERD, hx of CHFpef, hypothyroidism, HTN,OSA not on cpap who presents to ed with acute sob that began am PTA. Patient also symptoms worse the exertion. She notes associated chills, body aches, and cough as well as nausea. She does not abdominal muscle pain with coughing but no chest pain with deep breathing.  IN ref to history of cpap she notes she was on it in the past but after losing weight has improvement in symptoms and has not used it since then.  ED Course:  In ed vitals:  tmx 99.5, BP 126/94,rr26    sat 80% ra 94 % on 6L Wbc: 14.9, hgb 12.9 plt 247 Na 137, K 4.1, cl104, glucose 119 cr0.69 BNP 529.4 Lactic 1.6 UA: neg specific gravity 1.034 CXR: [id number]: Pulmonary opacities primarily on the left suspicious for pneumonia. Afib with rapid with rvr at 116  CTPA IMPRESSION: 1.  No evidence of pulmonary embolism.   2. Confluent multifocal opacities in the left upper and lower lobes as well as scattered patchy lung opacities in the right upper and lower lobes consistent with multilobar pneumonia, left worse than the right. Follow-up examination to resolution is recommended.   2.  Small bilateral pleural effusions, left greater than the right.   Covid negative  Tx ctx, LR 1L Review of Systems: As per HPI otherwise 10 point review of systems negative.   Past Medical History:  Diagnosis Date   Anxiety    Arthritis    RA   Borderline diabetes    CHF (congestive heart failure) (HCC)    COVID-19 11/2019   Dyspnea    OFF AND ON SINCE COVID 2021   GERD (gastroesophageal reflux disease)    Goiter    Heart  murmur    not being followed or treated. has lost weight with improvement of murmur   History of blood transfusion    unsure of when she had transfusion but states she had a terrible reaction and needed medication   Hypertension    Hyperthyroidism    Hypothyroidism    not on meds at this time   Medical history non-contributory    Pneumonia 11/2019   Sleep apnea    does not use cpap since weight loss    Past Surgical History:  Procedure Laterality Date   ANTERIOR CERVICAL DECOMP/DISCECTOMY FUSION N/A 01/18/2020   Procedure: ANTERIOR CERVICAL DECOMPRESSION/DISCECTOMY FUSION 1 LEVEL;  Surgeon: Venetia Night, MD;  Location: [facility] ORS;  Service: Neurosurgery;  Laterality: N/A;  C4-5   CESAREAN SECTION  1982   x 1   COLONOSCOPY WITH PROPOFOL N/A 03/12/2022   Procedure: COLONOSCOPY WITH PROPOFOL;  Surgeon: Toney Reil, MD;  Location: [facility] ENDOSCOPY;  Service: Gastroenterology;  Laterality: N/A;   TOTAL HIP ARTHROPLASTY Left 05/12/2018   Procedure: TOTAL HIP ARTHROPLASTY ANTERIOR APPROACH;  Surgeon: Kennedy Bucker, MD;  Location: [facility] ORS;  Service: Orthopedics;  Laterality: Left;   TOTAL KNEE ARTHROPLASTY Right 01/28/2021   Procedure: TOTAL KNEE ARTHROPLASTY;  Surgeon: Kennedy Bucker, MD;  Location: [facility] ORS;  Service: Orthopedics;  Laterality: Right;     reports that she quit smoking about 9  years ago. Her smoking use included cigarettes. She smoked an average of .25 packs per day. She has never used smokeless tobacco. She reports current alcohol use. She reports that she does not use drugs.  Allergies  Allergen Reactions   Hydromorphone Anaphylaxis and Shortness Of Breath   Lisinopril Anaphylaxis, Nausea And Vomiting, Swelling and Other (See Comments)    LIP SWELLING   Meloxicam Anaphylaxis and Other (See Comments)    Weakness in Legs Other reaction(s): Other (See Comments) Weakness in Legs Weakness in Legs    Adhesive [Tape] Other (See Comments)    Irritates skin. (band-aids)  Paper tape is okay    Family History  Problem Relation Age of Onset   Breast cancer Neg Hx     Prior to Admission medications   Medication Sig Start Date End Date Taking? Authorizing Provider  amLODipine (NORVASC) 10 MG tablet Take 10 mg by mouth in the morning.    [provider]  cholecalciferol (VITAMIN D) 25 MCG (1000 UNIT) tablet Take 2,000 Units by mouth in the morning.    [provider]  enoxaparin (LOVENOX) 40 MG/0.4ML injection Inject 0.4 mLs (40 mg total) into the skin daily for 14 days. 01/31/21 12/05/21  Anson Oregon, PA-C  folic acid (FOLVITE) 1 MG tablet Take 1 mg by mouth in the morning. 01/01/21   [provider]  furosemide (LASIX) 20 MG tablet Take 20 mg by mouth as needed. 01/01/21   [provider]  gabapentin (NEURONTIN) 300 MG capsule Take 300 mg by mouth 3 (three) times daily as needed (nerve pain/nerve damage). 11/06/19   [provider]  losartan (COZAAR) 100 MG tablet Take 100 mg by mouth in the morning.    [provider]  methotrexate (RHEUMATREX) 2.5 MG tablet Take 15 mg by mouth every Sunday. 01/02/21   [provider]  SYNTHROID 88 MCG tablet Take 88 mcg by mouth daily before breakfast. 06/16/19   [provider]    Physical Exam: Vitals:   06/08/22 1730 06/08/22 1803 06/08/22 1830 06/08/22 1900  BP: 133/78 125/85 128/85 (!) 119/99  Pulse: (!) 113 79 (!) 121 80  Resp: (!) 24 (!) 33 (!) 30 (!) 33  Temp:      TempSrc:      SpO2: (!) 87% (!) 85% 93% 91%  Weight:      Height:         Vitals:   06/08/22 1730 06/08/22 1803 06/08/22 1830 06/08/22 1900  BP: 133/78 125/85 128/85 (!) 119/99  Pulse: (!) 113 79 (!) 121 80  Resp: (!) 24 (!) 33 (!) 30 (!) 33  Temp:      TempSrc:      SpO2: (!) 87% (!) 85% 93% 91%  Weight:      Height:      Constitutional: increase wob, appears fatigued Eyes: PERRL, lids and conjunctivae normal ENMT: Mucous membranes are moist. Posterior pharynx  clear of any exudate or lesions.Normal dentition.  Neck: normal, supple, no masses, no thyromegaly Respiratory: clear to auscultation bilaterally, no wheezing, no crackles. Increase wob mild accessory muscle use.  Cardiovascular: irregular tachy no murmurs / rubs / gallops. No extremity edema. 2+ pedal pulses. Abdomen: no tenderness, no masses palpated. No hepatosplenomegaly. Bowel sounds positive.  Musculoskeletal: no clubbing / cyanosis. No joint deformity upper and lower extremities. Good ROM, no contractures. Normal muscle tone.  Skin: no rashes, lesions, ulcers. No induration Neurologic: CN 2-12 grossly intact. Sensation intact, . Strength 5/5 in all  4.  Psychiatric: Normal judgment and insight. Alert and oriented x 3. Normal mood.    Labs on Admission: I have personally reviewed following labs and imaging studies  CBC: Recent Labs  Lab 06/08/22 1134  WBC 14.9*  HGB 12.9  HCT 39.2  MCV 94.0  PLT 247   Basic Metabolic Panel: Recent Labs  Lab 06/08/22 1134  NA 137  K 4.1  CL 104  CO2 24  GLUCOSE 119*  BUN 13  CREATININE 0.69  CALCIUM 8.9   GFR: Estimated Creatinine Clearance: 85.1 mL/min (by C-G formula based on SCr of 0.69 mg/dL). Liver Function Tests: No results for input(s): "AST", "ALT", "ALKPHOS", "BILITOT", "PROT", "ALBUMIN" in the last 168 hours. No results for input(s): "LIPASE", "AMYLASE" in the last 168 hours. No results for input(s): "AMMONIA" in the last 168 hours. Coagulation Profile: Recent Labs  Lab 06/08/22 1530  INR 1.0   Cardiac Enzymes: No results for input(s): "CKTOTAL", "CKMB", "CKMBINDEX", "TROPONINI" in the last 168 hours. BNP (last 3 results) No results for input(s): "PROBNP" in the last 8760 hours. HbA1C: No results for input(s): "HGBA1C" in the last 72 hours. CBG: No results for input(s): "GLUCAP" in the last 168 hours. Lipid Profile: No results for input(s): "CHOL", "HDL", "LDLCALC", "TRIG", "CHOLHDL", "LDLDIRECT" in the last 72  hours. Thyroid Function Tests: No results for input(s): "TSH", "T4TOTAL", "FREET4", "T3FREE", "THYROIDAB" in the last 72 hours. Anemia Panel: No results for input(s): "VITAMINB12", "FOLATE", "FERRITIN", "TIBC", "IRON", "RETICCTPCT" in the last 72 hours. Urine analysis:    Component Value Date/Time   COLORURINE STRAW (A) 06/08/2022 1443   APPEARANCEUR CLEAR (A) 06/08/2022 1443   LABSPEC 1.034 (H) 06/08/2022 1443   PHURINE 6.0 06/08/2022 1443   GLUCOSEU NEGATIVE 06/08/2022 1443   HGBUR NEGATIVE 06/08/2022 1443   BILIRUBINUR NEGATIVE 06/08/2022 1443   KETONESUR 20 (A) 06/08/2022 1443   PROTEINUR NEGATIVE 06/08/2022 1443   NITRITE NEGATIVE 06/08/2022 1443   LEUKOCYTESUR NEGATIVE 06/08/2022 1443    Radiological Exams on Admission: CT Angio Chest PE W and/or Wo Contrast  Result Date: 06/08/2022 CLINICAL DATA:  Shortness of breath and nausea. 80% O2 sats at room air. EXAM: CT ANGIOGRAPHY CHEST WITH CONTRAST TECHNIQUE: Multidetector CT imaging of the chest was performed using the standard protocol during bolus administration of intravenous contrast. Multiplanar CT image reconstructions and MIPs were obtained to evaluate the vascular anatomy. RADIATION DOSE REDUCTION: This exam was performed according to the departmental dose-optimization program which includes automated exposure control, adjustment of the mA and/or kV according to patient size and/or use of iterative reconstruction technique. CONTRAST:  75mL OMNIPAQUE IOHEXOL 350 MG/ML SOLN COMPARISON:  Chest radiograph performed earlier on the same date FINDINGS: Cardiovascular: Satisfactory opacification of the pulmonary arteries to the segmental level. No evidence of pulmonary embolism. Normal heart size. No pericardial effusion. Mediastinum/Nodes: No enlarged mediastinal, hilar, or axillary lymph nodes. Thyroid gland, trachea, and esophagus demonstrate no significant findings. Lungs/Pleura: Confluent lung opacities in the upper and lower lobes of  the left lung as well as patchy lung opacities in the right lower and dependent portion of the right upper lobe consistent with bilateral pneumonia. Small bilateral pleural effusions, left greater than the right. Azygous right pulmonary lobe. Upper Abdomen: No acute abnormality. Musculoskeletal: No chest wall abnormality. No acute or significant osseous findings. Review of the MIP images confirms the above findings. IMPRESSION: 1.  No evidence of pulmonary embolism. 2. Confluent multifocal opacities in the left upper and lower lobes as well as scattered patchy  lung opacities in the right upper and lower lobes consistent with multilobar pneumonia, left worse than the right. Follow-up examination to resolution is recommended. 2.  Small bilateral pleural effusions, left greater than the right. Electronically Signed   By: Larose Hires D.O.   On: 06/08/2022 16:11   DG Chest 2 View  Result Date: 06/08/2022 CLINICAL DATA:  Short of breath EXAM: CHEST - 2 VIEW COMPARISON:  04/04/2020 FINDINGS: Physis are present in the left lung with relative sparing of the apex. Patchy density is also present at the right lung base. Trace pleural effusions. Top-normal heart size. No pneumothorax. IMPRESSION: Pulmonary opacities primarily on the left suspicious for pneumonia. Electronically Signed   By: Guadlupe Spanish M.D.   On: 06/08/2022 12:17    EKG: Independently reviewed.   Assessment/Plan  Multifocal Pneumonia with acute hypoxic respiratory failure  -admit to step down  -patient placed on bipap due to work of breathing -abg pending -ctx/ azithromycin, de-escalate as able  -pulmonary toilet  -urine ag, sputum, f/u on culture data   -wean O2 as able   New Onset Afib -CHADS-VASc - 4  -full dose lovenox transition to oral as able  -echo in am  -continue with CCB drip wean as able  -start bb as tolerated  -consider cardiology consult in am    Question hx of CHFpef -last echo 2021 - noted normal ef , no LVH ,  however diastolic function was not able to be assesed - f/u on BNP   RA - on MTX as out patient  -hold currently  -she does note her chronic joint pains - supportive care for chronic joint pain     GERD -ppi       Hypothyroidism -resume synthroid    HTN -uncontrolled -resume home regimen  -prn   OSA  -on bipap for work of breathing  -does not use cpap at home  DVT prophylaxis: lovenox Code Status: full Family Communication: daughter at bedsideHargrove,Camisha Judie Petit (Daughter)  [phone] (Home Phone) Disposition Plan: patient  expected to be admitted greater than 2 midnights  Consults called: cardiology  Admission status: sdu   Lurline Del MD [facility]   If 7PM-7AM, please contact night-coverage [email or web address] Password [facility]  06/08/2022, 7:05 PM

## 2022-06-08 NOTE — ED Triage Notes (Signed)
Pt woke this morning feeling SOB and nauseated. Pt room air sats in triage 80% pt breathing worse with exertion. Denies any pain. States she feels a little nauseated but had a normal BM this morning. Felt fine yesterday.

## 2022-06-08 NOTE — Sepsis Progress Note (Signed)
Sepsis protocol monitored by eLink 

## 2022-06-08 NOTE — ED Notes (Signed)
Obtained 1 set of blood cultures; unsuccessful IV attempt x 2. Will consult for assistance in initiating IV access and obtaining additional lab work as ordered.

## 2022-06-08 NOTE — ED Provider Notes (Signed)
[facility] Provider Note    Event Date/Time   First MD Initiated Contact with Patient 06/08/22 1437     (approximate)  History   Chief Complaint: Shortness of Breath and Nausea  HPI  Alexandria Moore is a 61 y.o. female with a past medical history of anxiety, arthritis, CHF, gastric reflux, hypertension, presents emergency department for shortness of breath and chills.  According to the patient she felt well Saturday however Sunday morning she woke feeling somewhat weak with some mild shortness of breath that worsened throughout the day as well as an occasional cough.  Patient also states nausea that has really worsened today.  Patient arrives to the emergency department with room air saturation of 80%, placed on 3 L nasal cannula currently satting in the upper 90s.  Patient reports subjective fever/chills at home but is not measured a temperature, 99.5 in the emergency department.  Patient is tachypneic and on my examination tachycardic.  Physical Exam   Triage Vital Signs: ED Triage Vitals  Enc Vitals Group     BP 06/08/22 1126 (!) 126/94     Pulse Rate 06/08/22 1123 67     Resp 06/08/22 1123 (!) 22     Temp 06/08/22 1123 99.5 F (37.5 C)     Temp Source 06/08/22 1123 Oral     SpO2 06/08/22 1122 (!) 80 %     Weight 06/08/22 1124 232 lb (105.2 kg)     Height 06/08/22 1124 5\' 4"  (1.626 m)     Head Circumference --      Peak Flow --      Pain Score 06/08/22 1123 7     Pain Loc --      Pain Edu? --      Excl. in GC? --     Most recent vital signs: Vitals:   06/08/22 1123 06/08/22 1126  BP:  (!) 126/94  Pulse: 67   Resp: (!) 22   Temp: 99.5 F (37.5 C)   SpO2: 96%     General: Awake, no distress.  CV:  Good peripheral perfusion.  Regular rate and rhythm around 100 bpm. Resp:  Mild tachypnea with clear lung sounds bilaterally no obvious wheeze rales or rhonchi. Abd:  No distention.  Soft, nontender.  No rebound or guarding. Other:  No  significant lower extremity edema or tenderness.   ED Results / Procedures / Treatments   EKG  EKG viewed and interpreted by myself shows atrial fibrillation with rapid ventricular response of 116 bpm with a narrow QRS, normal axis, normal intervals, nonspecific but no concerning ST changes.  I reviewed the last EKG no sign of prior atrial fibrillation.  RADIOLOGY  I have reviewed and interpreted the chest x-ray images patient appears to have a significant consolidation in the left lung fields possibly pneumonia. Radiology is reading pulmonary opacities on the left suspicious for pneumonia.   MEDICATIONS ORDERED IN ED: Medications  lactated ringers infusion (has no administration in time range)  sodium chloride 0.9 % bolus 1,000 mL (has no administration in time range)  cefTRIAXone (ROCEPHIN) 2 g in sodium chloride 0.9 % 100 mL IVPB (has no administration in time range)  azithromycin (ZITHROMAX) 500 mg in sodium chloride 0.9 % 250 mL IVPB (has no administration in time range)     IMPRESSION / MDM / ASSESSMENT AND PLAN / ED COURSE  I reviewed the triage vital signs and the nursing notes.  Patient's presentation is most consistent with acute  presentation with potential threat to life or bodily function.  Patient presents emergency department for worsening shortness of breath nausea chills at home.  Patient satting 80% on room air placed on 3 L nasal cannula satting in the upper 90s currently.  Patient is tachycardic around 100 breaths/min, tachypneic around 25 breaths/min during my evaluation.  Patient currently satting 95% on 3 L during my evaluation.  Chest x-ray is concerning for left-sided pneumonia.  Lab work shows moderate leukocytosis 14,900 otherwise reassuring CBC with an overall reassuring chemistry including normal renal function.  Given the patient's tachycardia tachypnea hypoxia and leukocytosis we will start the patient on broad-spectrum antibiotics as the patient meets  sepsis criteria.  We will check blood cultures.  We will cover for community-acquired pneumonia.  Given the quick decline in the patient from Sunday morning till now we will obtain a CTA to rule out pulmonary embolism and to better evaluate the left-sided opacities.  Remainder the lab work is pending including cardiac enzyme and BNP.  Patient will require admission to the hospital service once the emergency department work-up has been completed.  Current EKG is concerning for atrial fibrillation I do not see any documented history of atrial fibrillation I reviewed the patient's last EKG from 01/29/2021 which was a normal sinus.  We will continue with IV hydration for the time being and continue to closely monitor.  CRITICAL CARE Performed by: Minna Antis   Total critical care time: 30 minutes  Critical care time was exclusive of separately billable procedures and treating other patients.  Critical care was necessary to treat or prevent imminent or life-threatening deterioration.  Critical care was time spent personally by me on the following activities: development of treatment plan with patient and/or surrogate as well as nursing, discussions with consultants, evaluation of patient's response to treatment, examination of patient, obtaining history from patient or surrogate, ordering and performing treatments and interventions, ordering and review of laboratory studies, ordering and review of radiographic studies, pulse oximetry and re-evaluation of patient's condition.   FINAL CLINICAL IMPRESSION(S) / ED DIAGNOSES   Sepsis Pneumonia Hypoxia New onset atrial fibrillation with rapid ventricular response   Note:  This document was prepared using Dragon voice recognition software and may include unintentional dictation errors.   Minna Antis, MD 06/08/22 [phone]

## 2022-06-08 NOTE — ED Notes (Signed)
Report Given to [facility] in ICU. Pt is on 6lpm O2

## 2022-06-08 NOTE — ED Notes (Signed)
EDP at bedside  

## 2022-06-08 NOTE — Progress Notes (Signed)
CODE SEPSIS - PHARMACY COMMUNICATION  **Broad Spectrum Antibiotics should be administered within 1 hour of Sepsis diagnosis**  Time Code Sepsis Called/Page Received: 1455  Antibiotics Ordered: ceftriaxone 2 grams x 1, azithromycin 500 mg every 24 hours x 5 doses  Time of 1st antibiotic administration: 1533  Additional action taken by pharmacy: none  If necessary, Name of Provider/Nurse Contacted: n/a   Elliot Gurney, PharmD Clinical Pharmacist  06/08/2022 2:51 PM

## 2022-06-08 NOTE — ED Provider Triage Note (Signed)
Emergency Medicine Provider Triage Evaluation Note  Alexandria Moore , a 61 y.o. female  was evaluated in triage.  Pt complains of SOB and nausea this morning. Felt fine yesterday but started around 11am.  Back pain, cough occasionally.       Review of Systems  Positive: Hx of Covid and pneumonia Negative: No vomiting or diarrhea.    Physical Exam  LMP 01/17/2015  Gen:   Awake, no distress   Resp:  Normal effort   Lungs with fine crackles MSK:   Moves extremities without difficulty  Other:    Medical Decision Making  Medically screening exam initiated at 11:19 AM.  Appropriate orders placed.  Lyndee Hensen was informed that the remainder of the evaluation will be completed by another provider, this initial triage assessment does not replace that evaluation, and the importance of remaining in the ED until their evaluation is complete.     Tommi Rumps, PA-C 06/08/22 1128

## 2022-06-08 NOTE — ED Provider Notes (Signed)
5:15 PM Assumed care for off going team.   Blood pressure (!) 134/101, pulse (!) 136, temperature 98.1 F (36.7 C), temperature source Oral, resp. rate (!) 42, height 5\' 4"  (1.626 m), weight 105.2 kg, last menstrual period 01/17/2015, SpO2 (!) 89 %.  See their HPI for full report but in brief parents heart rates have climbed to the 130s with RVR*slightly increased respiratory rate and oxygen levels have decreased so patient was moved up to 6 L.  We will give a dose of diltiazem to help with her A-fib with RVR.  Her CT imaging was negative for PE but does show signs of pneumonia.  Did discuss this with family and need to follow-up for outpatient CT once things are better to ensure resolution.  They expressed understanding.  Sats are in the low 90s on the 6 L.  The respiratory rate still seems a little bit elevated.  Heart rate came down slightly after the diltiazem but then went back up.  We will give a dose of Dilts again and start on a dill drip.  Will place on BiPAP given she reports being on this previously and reporting it helped her feel more comfortable.  6:42 PM rates have come down with Dilt and dill drip.  Patient still will be placed on BiPAP.  I will discuss with the hospital team for admission.  I did discuss with them and they accept the patient for stepdown.  Her lactate is reassuring we will hold off on any additional fluids due to my concern that there could be a component of CHF associated with this   .1-3 Lead EKG Interpretation  Performed by: 03/19/2015, MD Authorized by: Concha Se, MD     Interpretation: abnormal     ECG rate:  120   ECG rate assessment: tachycardic     Rhythm: atrial fibrillation     Ectopy: none     Conduction: normal   .Critical Care  Performed by: Concha Se, MD Authorized by: Concha Se, MD   Critical care provider statement:    Critical care time (minutes):  30   Critical care was time spent personally by me on the following  activities:  Development of treatment plan with patient or surrogate, discussions with consultants, evaluation of patient's response to treatment, examination of patient, ordering and review of laboratory studies, ordering and review of radiographic studies, ordering and performing treatments and interventions, pulse oximetry, re-evaluation of patient's condition and review of old charts        Concha Se, MD 06/08/22 1843

## 2022-06-08 NOTE — ED Notes (Signed)
Notified provider of pt's vital signs. Awaiting orders; will continue to monitor. Pt's family at bedside

## 2022-06-09 ENCOUNTER — Inpatient Hospital Stay
Admit: 2022-06-09 | Discharge: 2022-06-09 | Disposition: A | Discharge status: Home / Self Care | Payer: No Typology Code available for payment source | Attending: Cardiology | Admitting: Cardiology

## 2022-06-09 ENCOUNTER — Inpatient Hospital Stay
Admit: 2022-06-09 | Discharge: 2022-06-09 | Disposition: A | Discharge status: Home / Self Care | Payer: No Typology Code available for payment source | Attending: Internal Medicine | Admitting: Internal Medicine

## 2022-06-09 DIAGNOSIS — I10 Essential (primary) hypertension: Secondary | ICD-10-CM | POA: Diagnosis not present

## 2022-06-09 DIAGNOSIS — Z6841 Body Mass Index (BMI) 40.0 and over, adult: Secondary | ICD-10-CM | POA: Diagnosis not present

## 2022-06-09 DIAGNOSIS — D849 Immunodeficiency, unspecified: Secondary | ICD-10-CM

## 2022-06-09 DIAGNOSIS — J189 Pneumonia, unspecified organism: Secondary | ICD-10-CM

## 2022-06-09 DIAGNOSIS — J9601 Acute respiratory failure with hypoxia: Secondary | ICD-10-CM | POA: Diagnosis not present

## 2022-06-09 LAB — CBC
HCT: 37.1 % (ref 36.0–46.0)
Hemoglobin: 12.1 g/dL (ref 12.0–15.0)
MCH: 30.9 pg (ref 26.0–34.0)
MCHC: 32.6 g/dL (ref 30.0–36.0)
MCV: 94.9 fL (ref 80.0–100.0)
Platelets: 208 10*3/uL (ref 150–400)
RBC: 3.91 MIL/uL (ref 3.87–5.11)
RDW: 15.1 % (ref 11.5–15.5)
WBC: 14.6 10*3/uL — ABNORMAL HIGH (ref 4.0–10.5)
nRBC: 0 % (ref 0.0–0.2)

## 2022-06-09 LAB — C-REACTIVE PROTEIN: CRP: 12.4 mg/dL — ABNORMAL HIGH (ref <1.0)

## 2022-06-09 LAB — COMPREHENSIVE METABOLIC PANEL
ALT: 22 U/L (ref 0–44)
AST: 16 U/L (ref 15–41)
Albumin: 3.4 g/dL — ABNORMAL LOW (ref 3.5–5.0)
Alkaline Phosphatase: 62 U/L (ref 38–126)
Anion gap: 6 (ref 5–15)
BUN: 9 mg/dL (ref 8–23)
CO2: 23 mmol/L (ref 22–32)
Calcium: 8.5 mg/dL — ABNORMAL LOW (ref 8.9–10.3)
Chloride: 108 mmol/L (ref 98–111)
Creatinine, Ser: 0.65 mg/dL (ref 0.44–1.00)
GFR, Estimated: >60 mL/min (ref >60)
Glucose, Bld: 113 mg/dL — ABNORMAL HIGH (ref 70–99)
Potassium: 3.6 mmol/L (ref 3.5–5.1)
Sodium: 137 mmol/L (ref 135–145)
Total Bilirubin: 2.3 mg/dL — ABNORMAL HIGH (ref 0.3–1.2)
Total Protein: 6.3 g/dL — ABNORMAL LOW (ref 6.5–8.1)

## 2022-06-09 LAB — STREP PNEUMONIAE URINARY ANTIGEN: Strep Pneumo Urinary Antigen: NEGATIVE

## 2022-06-09 LAB — MAGNESIUM: Magnesium: 1.8 mg/dL (ref 1.7–2.4)

## 2022-06-09 LAB — HEMOGLOBIN A1C
Hgb A1c MFr Bld: 5.5 % (ref 4.8–5.6)
Mean Plasma Glucose: 111.15 mg/dL

## 2022-06-09 LAB — TROPONIN I (HIGH SENSITIVITY): Troponin I (High Sensitivity): 9 ng/L (ref <18)

## 2022-06-09 LAB — HIV ANTIBODY (ROUTINE TESTING W REFLEX): HIV Screen 4th Generation wRfx: NONREACTIVE

## 2022-06-09 LAB — GLUCOSE, CAPILLARY: Glucose-Capillary: 95 mg/dL (ref 70–99)

## 2022-06-09 MED ORDER — PERFLUTREN LIPID MICROSPHERE
1.0000 mL | INTRAVENOUS | Status: AC | PRN
  Administered 2022-06-09: 2 mL via INTRAVENOUS

## 2022-06-09 MED ORDER — SODIUM CHLORIDE 0.9 % IV SOLN: INTRAVENOUS | Status: DC | PRN

## 2022-06-09 MED ORDER — LEVOTHYROXINE SODIUM 88 MCG PO TABS
88.0000 ug | ORAL_TABLET | Freq: Every day | ORAL | Status: DC
  Administered 2022-06-09 – 2022-06-12 (×4): 88 ug via ORAL
  Filled 2022-06-09 (×4): qty 1

## 2022-06-09 MED ORDER — DILTIAZEM HCL 30 MG PO TABS
30.0000 mg | ORAL_TABLET | Freq: Four times a day (QID) | ORAL | Status: DC
  Administered 2022-06-09 – 2022-06-10 (×4): 30 mg via ORAL
  Filled 2022-06-09 (×3): qty 1

## 2022-06-09 MED ORDER — FOLIC ACID 1 MG PO TABS
1.0000 mg | ORAL_TABLET | Freq: Every morning | ORAL | Status: DC
  Administered 2022-06-09 – 2022-06-12 (×4): 1 mg via ORAL
  Filled 2022-06-09 (×4): qty 1

## 2022-06-09 MED ORDER — METHOTREXATE 2.5 MG PO TABS: 15.0000 mg | ORAL_TABLET | ORAL | Status: DC

## 2022-06-09 MED ORDER — GABAPENTIN 300 MG PO CAPS: 300.0000 mg | ORAL_CAPSULE | Freq: Three times a day (TID) | ORAL | Status: DC | PRN

## 2022-06-09 NOTE — Discharge Instructions (Signed)

## 2022-06-09 NOTE — Consult Note (Addendum)
[facility] CLINIC CARDIOLOGY CONSULT NOTE       Patient ID: Alexandria Moore MRN: [id number] DOB/AGE: October 13, 1960 61 y.o.  Admit date: 06/08/2022 Referring Physician Dr. Georgeann Oppenheim  Primary Physician Phineas Real [facility] Primary Cardiologist none Reason for Consultation new onset paroxysmal AF RVR  HPI: Alexandria Moore is a 61yoF with a PMH of hypertension, RA on methotrexate, GERD, hypothyroidism, OSA not on CPAP after losing weight who presented to [facility] ED 06/08/2022 with shortness of breath, was hypoxic to the 80s on room air, CTA chest negative for PE but showed multilobar pneumonia.  Cardiology is consulted for assistance with her new onset A-fib with RVR.  She presents with her son who contributes to the history.  She said she was feeling relatively well all last week, celebrated her birthday with her family on Saturday [birth year], but Sunday evening she started feeling "like she was wheezing" and very short of breath.  She did not initially have a cough, but has since developed one, and feeling like there is phlegm in her throat.  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  She has occasional peripheral edema, worse in her left leg, attributed to swelling from her RA and was recently started on a course of steroids per her rheumatologist for this.  She has some upper abdominal soreness with coughing, currently denies any chest pain, palpitations, dizziness.  She says she feels somewhat better from a breathing standpoint, less labored, but still not at baseline.  Family history of COPD and CHF in her mom, dad with hypertension.  Multiple family members with CVAs.  Previously smoked tobacco, "just puffed" occasionally while stressed, has not done so in a while.  Drinks alcohol socially on the weekends.  Denies illicit substance use.  Recent vitals are notable for a blood pressure of 98/75, heart rate in the 70s to 90s in atrial  fibrillation on telemetry during interview.    Labs are notable for potassium 3.6, BUN/creatinine 9/0.65 GFR greater than 60.  Magnesium within normal limits at 1.8.  BNP slightly elevated at 529, high-sensitivity troponin negative at 10-9.  CRP elevated at 12.  Negative procalcitonin.  Slight leukocytosis to 14.9-14.6. Hemoglobin A1c within normal limits at 5.5, TSH slightly elevated at 5.545.  Chest x-ray with pulmonary opacities primarily on left side suspicious for pneumonia.  CTA chest without evidence of PE did reveal confluent multifocal opacities in the left upper and lower lobes and patchy opacities in the right upper and lower lobes consistent with multilobar pneumonia.  Small bilateral pleural effusions left greater than right.   Review of systems complete and found to be negative unless listed above     Past Medical History:  Diagnosis Date   Anxiety    Arthritis    RA   Borderline diabetes    CHF (congestive heart failure) (HCC)    COVID-19 11/2019   Dyspnea    OFF AND ON SINCE COVID 2021   GERD (gastroesophageal reflux disease)    Goiter    Heart murmur    not being followed or treated. has lost weight with improvement of murmur   History of blood transfusion    unsure of when she had transfusion but states she had a terrible reaction and needed medication   Hypertension    Hyperthyroidism    Hypothyroidism    not on meds at this time   Medical history non-contributory    Pneumonia  11/2019   Sleep apnea    does not use cpap since weight loss    Past Surgical History:  Procedure Laterality Date   ANTERIOR CERVICAL DECOMP/DISCECTOMY FUSION N/A 01/18/2020   Procedure: ANTERIOR CERVICAL DECOMPRESSION/DISCECTOMY FUSION 1 LEVEL;  Surgeon: Venetia Night, MD;  Location: [facility] ORS;  Service: Neurosurgery;  Laterality: N/A;  C4-5   CESAREAN SECTION  1982   x 1   COLONOSCOPY WITH PROPOFOL N/A 03/12/2022   Procedure: COLONOSCOPY WITH PROPOFOL;  Surgeon: Toney Reil,  MD;  Location: [facility] ENDOSCOPY;  Service: Gastroenterology;  Laterality: N/A;   TOTAL HIP ARTHROPLASTY Left 05/12/2018   Procedure: TOTAL HIP ARTHROPLASTY ANTERIOR APPROACH;  Surgeon: Kennedy Bucker, MD;  Location: [facility] ORS;  Service: Orthopedics;  Laterality: Left;   TOTAL KNEE ARTHROPLASTY Right 01/28/2021   Procedure: TOTAL KNEE ARTHROPLASTY;  Surgeon: Kennedy Bucker, MD;  Location: [facility] ORS;  Service: Orthopedics;  Laterality: Right;    Medications Prior to Admission  Medication Sig Dispense Refill Last Dose   amLODipine (NORVASC) 10 MG tablet Take 10 mg by mouth in the morning.   06/08/2022   cholecalciferol (VITAMIN D) 25 MCG (1000 UNIT) tablet Take 2,000 Units by mouth in the morning.   06/08/2022   folic acid (FOLVITE) 1 MG tablet Take 1 mg by mouth in the morning.   06/08/2022   gabapentin (NEURONTIN) 300 MG capsule Take 300 mg by mouth 3 (three) times daily as needed (nerve pain/nerve damage).   06/08/2022   losartan (COZAAR) 100 MG tablet Take 100 mg by mouth in the morning.   06/08/2022   SYNTHROID 88 MCG tablet Take 88 mcg by mouth daily before breakfast.   06/08/2022   VICTOZA 18 MG/3ML SOPN Inject into the skin.   Past Week   enoxaparin (LOVENOX) 40 MG/0.4ML injection Inject 0.4 mLs (40 mg total) into the skin daily for 14 days. 5.6 mL 0    furosemide (LASIX) 20 MG tablet Take 20 mg by mouth as needed. (Patient not taking: Reported on 06/09/2022)   Not Taking   ibuprofen (ADVIL) 800 MG tablet Take 800 mg by mouth 3 (three) times daily.   prn   methotrexate (RHEUMATREX) 2.5 MG tablet Take 15 mg by mouth every Sunday.   06/07/2022   PREMARIN vaginal cream SMARTSIG:1 Vaginal Daily   prn   WEGOVY 0.5 MG/0.5ML SOAJ Inject 0.5 mg into the skin once a week. (Patient not taking: Reported on 06/09/2022)   Not Taking   Social History   Socioeconomic History   Marital status: Single    Spouse name: Not on file   Number of children: 2   Years of education: 12   Highest education level: High school  graduate  Occupational History    Employer: [employer]  Tobacco Use   Smoking status: Former    Packs/day: 0.25    Types: Cigarettes    Quit date: 2014    Years since quitting: 9.6   Smokeless tobacco: Never   Tobacco comments:    SMOKED LESS THAN A YEAR   Vaping Use   Vaping Use: Never used  Substance and Sexual Activity   Alcohol use: Yes    Comment: OCC WINE   Drug use: Never   Sexual activity: Yes    Birth control/protection: Post-menopausal  Other Topics Concern   Not on file  Social History Narrative   Not on file   Social Determinants of Health   Financial Resource Strain: Not on file  Food Insecurity:  Not on file  Transportation Needs: Not on file  Physical Activity: Not on file  Stress: Not on file  Social Connections: Not on file  Intimate Partner Violence: Not on file    Family History  Problem Relation Age of Onset   Breast cancer Neg Hx       PHYSICAL EXAM General: Pleasant ill-appearing black female, well nourished, in no acute distress.  Laying in incline in ICU bed with son at bedside. HEENT:  Normocephalic and atraumatic. Neck:  No JVD.  Lungs: Conversational dyspnea on oxygen by nasal cannula.  Coarse breath sounds auscultation anteriorly, bibasilar rhonchi. Heart: Irregularly irregular with controlled rate.  Difficult to hear heart sounds over breath sounds.   Abdomen: Obese appearing.  Msk: Normal strength and tone for age. Extremities: Warm and well perfused. No clubbing, cyanosis.  Trace lower extremity edema left greater than sign right edema.  Neuro: Alert and oriented X 3. Psych:  Answers questions appropriately.   Labs: Basic Metabolic Panel: Recent Labs    06/08/22 1134 06/09/22 0036  NA 137 137  K 4.1 3.6  CL 104 108  CO2 24 23  GLUCOSE 119* 113*  BUN 13 9  CREATININE 0.69 0.65  CALCIUM 8.9 8.5*   Liver Function Tests: Recent Labs    06/09/22 0036  AST 16  ALT 22  ALKPHOS 62  BILITOT 2.3*  PROT 6.3*   ALBUMIN 3.4*   No results for input(s): "LIPASE", "AMYLASE" in the last 72 hours. CBC: Recent Labs    06/08/22 1134 06/09/22 0036  WBC 14.9* 14.6*  HGB 12.9 12.1  HCT 39.2 37.1  MCV 94.0 94.9  PLT 247 208   Cardiac Enzymes: Recent Labs    06/08/22 2207 06/09/22 0036  TROPONINIHS 10 9   BNP: Invalid input(s): "POCBNP" D-Dimer: No results for input(s): "DDIMER" in the last 72 hours. Hemoglobin A1C: Recent Labs    06/08/22 2208  HGBA1C 5.5   Fasting Lipid Panel: No results for input(s): "CHOL", "HDL", "LDLCALC", "TRIG", "CHOLHDL", "LDLDIRECT" in the last 72 hours. Thyroid Function Tests: Recent Labs    06/08/22 2207  TSH 4.545*   Anemia Panel: No results for input(s): "VITAMINB12", "FOLATE", "FERRITIN", "TIBC", "IRON", "RETICCTPCT" in the last 72 hours.  CT Angio Chest PE W and/or Wo Contrast  Result Date: 06/08/2022 CLINICAL DATA:  Shortness of breath and nausea. 80% O2 sats at room air. EXAM: CT ANGIOGRAPHY CHEST WITH CONTRAST TECHNIQUE: Multidetector CT imaging of the chest was performed using the standard protocol during bolus administration of intravenous contrast. Multiplanar CT image reconstructions and MIPs were obtained to evaluate the vascular anatomy. RADIATION DOSE REDUCTION: This exam was performed according to the departmental dose-optimization program which includes automated exposure control, adjustment of the mA and/or kV according to patient size and/or use of iterative reconstruction technique. CONTRAST:  34mL OMNIPAQUE IOHEXOL 350 MG/ML SOLN COMPARISON:  Chest radiograph performed earlier on the same date FINDINGS: Cardiovascular: Satisfactory opacification of the pulmonary arteries to the segmental level. No evidence of pulmonary embolism. Normal heart size. No pericardial effusion. Mediastinum/Nodes: No enlarged mediastinal, hilar, or axillary lymph nodes. Thyroid gland, trachea, and esophagus demonstrate no significant findings. Lungs/Pleura:  Confluent lung opacities in the upper and lower lobes of the left lung as well as patchy lung opacities in the right lower and dependent portion of the right upper lobe consistent with bilateral pneumonia. Small bilateral pleural effusions, left greater than the right. Azygous right pulmonary lobe. Upper Abdomen: No acute abnormality. Musculoskeletal: No chest  wall abnormality. No acute or significant osseous findings. Review of the MIP images confirms the above findings. IMPRESSION: 1.  No evidence of pulmonary embolism. 2. Confluent multifocal opacities in the left upper and lower lobes as well as scattered patchy lung opacities in the right upper and lower lobes consistent with multilobar pneumonia, left worse than the right. Follow-up examination to resolution is recommended. 2.  Small bilateral pleural effusions, left greater than the right. Electronically Signed   By: Larose Hires D.O.   On: 06/08/2022 16:11   DG Chest 2 View  Result Date: 06/08/2022 CLINICAL DATA:  Short of breath EXAM: CHEST - 2 VIEW COMPARISON:  04/04/2020 FINDINGS: Physis are present in the left lung with relative sparing of the apex. Patchy density is also present at the right lung base. Trace pleural effusions. Top-normal heart size. No pneumothorax. IMPRESSION: Pulmonary opacities primarily on the left suspicious for pneumonia. Electronically Signed   By: Guadlupe Spanish M.D.   On: 06/08/2022 12:17     Radiology: CT Angio Chest PE W and/or Wo Contrast  Result Date: 06/08/2022 CLINICAL DATA:  Shortness of breath and nausea. 80% O2 sats at room air. EXAM: CT ANGIOGRAPHY CHEST WITH CONTRAST TECHNIQUE: Multidetector CT imaging of the chest was performed using the standard protocol during bolus administration of intravenous contrast. Multiplanar CT image reconstructions and MIPs were obtained to evaluate the vascular anatomy. RADIATION DOSE REDUCTION: This exam was performed according to the departmental dose-optimization program  which includes automated exposure control, adjustment of the mA and/or kV according to patient size and/or use of iterative reconstruction technique. CONTRAST:  75mL OMNIPAQUE IOHEXOL 350 MG/ML SOLN COMPARISON:  Chest radiograph performed earlier on the same date FINDINGS: Cardiovascular: Satisfactory opacification of the pulmonary arteries to the segmental level. No evidence of pulmonary embolism. Normal heart size. No pericardial effusion. Mediastinum/Nodes: No enlarged mediastinal, hilar, or axillary lymph nodes. Thyroid gland, trachea, and esophagus demonstrate no significant findings. Lungs/Pleura: Confluent lung opacities in the upper and lower lobes of the left lung as well as patchy lung opacities in the right lower and dependent portion of the right upper lobe consistent with bilateral pneumonia. Small bilateral pleural effusions, left greater than the right. Azygous right pulmonary lobe. Upper Abdomen: No acute abnormality. Musculoskeletal: No chest wall abnormality. No acute or significant osseous findings. Review of the MIP images confirms the above findings. IMPRESSION: 1.  No evidence of pulmonary embolism. 2. Confluent multifocal opacities in the left upper and lower lobes as well as scattered patchy lung opacities in the right upper and lower lobes consistent with multilobar pneumonia, left worse than the right. Follow-up examination to resolution is recommended. 2.  Small bilateral pleural effusions, left greater than the right. Electronically Signed   By: Larose Hires D.O.   On: 06/08/2022 16:11   DG Chest 2 View  Result Date: 06/08/2022 CLINICAL DATA:  Short of breath EXAM: CHEST - 2 VIEW COMPARISON:  04/04/2020 FINDINGS: Physis are present in the left lung with relative sparing of the apex. Patchy density is also present at the right lung base. Trace pleural effusions. Top-normal heart size. No pneumothorax. IMPRESSION: Pulmonary opacities primarily on the left suspicious for pneumonia.  Electronically Signed   By: Guadlupe Spanish M.D.   On: 06/08/2022 12:17    ECHO 12/06/2019 1. Left ventricular ejection fraction, by estimation, is 55 to 60%. The left ventricle has normal function. The left ventricle has no regional wall motion abnormalities. Left ventricular diastolic function could not be evaluated.  2. Right ventricular systolic function is normal. The right ventricular size is normal. There is normal pulmonary artery systolic pressure. 3. The mitral valve is normal in structure and function. No evidence of mitral valve regurgitation. 4. The aortic valve is grossly normal. Aortic valve regurgitation is not visualized. 5. Pulmonic valve regurgitation not assessed. 6. The inferior vena cava is normal in size with greater than 50% respiratory variability, suggesting right atrial pressure of 3 mmHg.  TELEMETRY reviewed by me (LT) 06/09/2022 : Atrial fibrillation with initially heart rates in the 120s to 130s, later the afternoon of 8/29 remains in AF with heart rates in the high 90s to low 110s, during interview around 3:30 PM heart rates between 70-90.  EKG reviewed by me: Atrial fibrillation rate 116  Data reviewed by me (LT) 06/09/2022: ED note, admission H&P, CBC, BMP, BNP, EKG, telemetry, ordered magnesium chest x-ray, CTA  ASSESSMENT AND PLAN:  Lyndee Hensen is a 61yoF with a PMH of hypertension, RA on methotrexate, GERD, hypothyroidism, OSA not on CPAP after losing weight who presented to [facility] ED 06/08/2022 with shortness of breath, was hypoxic to the 80s on room air, CTA chest negative for PE but showed multilobar pneumonia.  Cardiology is consulted for assistance with her new onset A-fib with RVR.  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  Heart rates initially in the 120s to 130s, much improved after diltiazem drip to interview the afternoon of 8/29 with rates mostly in the 80s to 90s.  We will continue rate control strategy in setting of pneumonia and hypoxia as above. -S/p IV diltiazem 10 mg x 1, 15 mg x 1, started on diltiazem gtt. around 1830 on 8/28. -We will discontinue diltiazem gtt. in favor of p.o. dosing, agree with diltiazem 30 mg p.o. every 6 hours for now, will likely consolidate by time of discharge -Agree with Eliquis 5 mg twice daily for stroke risk reduction.  CHA2DS2-VASc 2 (sex, htn) -Hold home amlodipine in favor of diltiazem -Echocardiogram complete -Telemetry monitoring -Risk factors: A1c 5.5, TSH okay at 4.5  This patient's plan of care was discussed and created with Dr. Juliann Pares and he is in agreement.  Signed: Rebeca Allegra , PA-C 06/09/2022, 2:53 PM [facility]

## 2022-06-10 DIAGNOSIS — D849 Immunodeficiency, unspecified: Secondary | ICD-10-CM

## 2022-06-10 DIAGNOSIS — J9601 Acute respiratory failure with hypoxia: Secondary | ICD-10-CM | POA: Diagnosis not present

## 2022-06-10 DIAGNOSIS — E039 Hypothyroidism, unspecified: Secondary | ICD-10-CM

## 2022-06-10 DIAGNOSIS — M069 Rheumatoid arthritis, unspecified: Secondary | ICD-10-CM

## 2022-06-10 DIAGNOSIS — J189 Pneumonia, unspecified organism: Secondary | ICD-10-CM

## 2022-06-10 DIAGNOSIS — I480 Paroxysmal atrial fibrillation: Secondary | ICD-10-CM | POA: Diagnosis not present

## 2022-06-10 LAB — RESPIRATORY PANEL BY PCR

## 2022-06-10 LAB — CBC
HCT: 35.1 % — ABNORMAL LOW (ref 36.0–46.0)
Hemoglobin: 11.4 g/dL — ABNORMAL LOW (ref 12.0–15.0)
MCH: 31.4 pg (ref 26.0–34.0)
MCHC: 32.5 g/dL (ref 30.0–36.0)
MCV: 96.7 fL (ref 80.0–100.0)
Platelets: 188 10*3/uL (ref 150–400)
RBC: 3.63 MIL/uL — ABNORMAL LOW (ref 3.87–5.11)
RDW: 15.1 % (ref 11.5–15.5)
WBC: 11.1 10*3/uL — ABNORMAL HIGH (ref 4.0–10.5)
nRBC: 0 % (ref 0.0–0.2)

## 2022-06-10 LAB — BASIC METABOLIC PANEL
Anion gap: 4 — ABNORMAL LOW (ref 5–15)
BUN: 8 mg/dL (ref 8–23)
CO2: 23 mmol/L (ref 22–32)
Calcium: 8.5 mg/dL — ABNORMAL LOW (ref 8.9–10.3)
Chloride: 111 mmol/L (ref 98–111)
Creatinine, Ser: 0.69 mg/dL (ref 0.44–1.00)
GFR, Estimated: >60 mL/min (ref >60)
Glucose, Bld: 133 mg/dL — ABNORMAL HIGH (ref 70–99)
Potassium: 3.7 mmol/L (ref 3.5–5.1)
Sodium: 138 mmol/L (ref 135–145)

## 2022-06-10 LAB — ECHOCARDIOGRAM COMPLETE
Height: 64 in
S' Lateral: 4.4 cm
Weight: 3865.99 oz

## 2022-06-10 LAB — LEGIONELLA PNEUMOPHILA SEROGP 1 UR AG: L. pneumophila Serogp 1 Ur Ag: NEGATIVE

## 2022-06-10 LAB — GLUCOSE, CAPILLARY: Glucose-Capillary: 151 mg/dL — ABNORMAL HIGH (ref 70–99)

## 2022-06-10 MED ORDER — METOPROLOL TARTRATE 5 MG/5ML IV SOLN
2.5000 mg | Freq: Once | INTRAVENOUS | Status: AC
  Administered 2022-06-10: 2.5 mg via INTRAVENOUS
  Filled 2022-06-10: qty 5

## 2022-06-10 MED ORDER — GUAIFENESIN-DM 100-10 MG/5ML PO SYRP
5.0000 mL | ORAL_SOLUTION | ORAL | Status: DC | PRN
  Administered 2022-06-10 – 2022-06-11 (×3): 5 mL via ORAL
  Filled 2022-06-10 (×3): qty 10

## 2022-06-10 MED ORDER — MAGNESIUM SULFATE 2 GM/50ML IV SOLN
2.0000 g | Freq: Once | INTRAVENOUS | Status: AC
  Administered 2022-06-10: 2 g via INTRAVENOUS
  Filled 2022-06-10 (×2): qty 50

## 2022-06-10 MED ORDER — DILTIAZEM HCL 30 MG PO TABS: 60.0000 mg | ORAL_TABLET | Freq: Four times a day (QID) | ORAL | Status: DC

## 2022-06-10 MED ORDER — METOPROLOL TARTRATE 25 MG PO TABS
25.0000 mg | ORAL_TABLET | Freq: Four times a day (QID) | ORAL | Status: DC
  Administered 2022-06-10 – 2022-06-11 (×4): 25 mg via ORAL
  Filled 2022-06-10 (×4): qty 1

## 2022-06-10 MED ORDER — POTASSIUM CHLORIDE CRYS ER 20 MEQ PO TBCR
40.0000 meq | EXTENDED_RELEASE_TABLET | Freq: Two times a day (BID) | ORAL | Status: AC
Start: 2022-06-10 — End: 2022-06-10
  Administered 2022-06-10 (×2): 40 meq via ORAL
  Filled 2022-06-10 (×2): qty 2

## 2022-06-10 MED ORDER — METHYLPREDNISOLONE SODIUM SUCC 40 MG IJ SOLR
40.0000 mg | Freq: Every day | INTRAMUSCULAR | Status: DC
  Administered 2022-06-10 – 2022-06-12 (×3): 40 mg via INTRAVENOUS
  Filled 2022-06-10 (×3): qty 1

## 2022-06-10 MED ORDER — FUROSEMIDE 10 MG/ML IJ SOLN
20.0000 mg | Freq: Once | INTRAMUSCULAR | Status: AC
  Administered 2022-06-10: 20 mg via INTRAVENOUS
  Filled 2022-06-10: qty 2

## 2022-06-10 NOTE — Assessment & Plan Note (Signed)
On levothyroxine  ?

## 2022-06-10 NOTE — Assessment & Plan Note (Addendum)
BMI 42.38

## 2022-06-10 NOTE — Progress Notes (Addendum)
Patient was seen evaluated and examined by me and the PA on 06/10/22.  Course of action, evaluation, and management decisions were developed solely by me, but detailed below in the PA's note.  [facility] CARDIOLOGY CONSULT NOTE       Patient ID: Alexandria Moore MRN: [id number] DOB/AGE: 02/12/1961 61 y.o.  Admit date: 06/08/2022 Referring Physician Dr. Georgeann Oppenheim  Primary Physician Phineas Real [facility] [place] Primary Cardiologist none Reason for Consultation new onset paroxysmal AF RVR  HPI: Alexandria Moore is a 61yoF with a PMH of hypertension, RA on methotrexate, GERD, hypothyroidism, OSA not on CPAP after losing weight who presented to [facility] ED 06/08/2022 with shortness of breath, was hypoxic to the 80s on room air, CTA chest negative for PE but showed multilobar pneumonia.  Cardiology is consulted for assistance with her new onset A-fib with RVR.  Interval History: -no acute events -remains in atrial fibrillation, rate in the low 100s to 130s on PO cardizem  -Continues to feel better in general, still somewhat short of breath and remains on 5 L by nasal cannula, no chest pain, palpitations  Addendum: Echo resulted after I had initially seen the patient, with reduced EF to 40 to 45%, plan as below: Switch Cardizem to metoprolol was below   Review of systems complete and found to be negative unless listed above     Past Medical History:  Diagnosis Date   Anxiety    Arthritis    RA   Borderline diabetes    CHF (congestive heart failure) (HCC)    COVID-19 11/2019   Dyspnea    OFF AND ON SINCE COVID 2021   GERD (gastroesophageal reflux disease)    Goiter    Heart murmur    not being followed or treated. has lost weight with improvement of murmur   History of blood transfusion    unsure of when she had transfusion but states she had a terrible reaction and needed medication   Hypertension    Hyperthyroidism    Hypothyroidism    not on meds at this time    Medical history non-contributory    Pneumonia 11/2019   Sleep apnea    does not use cpap since weight loss    Past Surgical History:  Procedure Laterality Date   ANTERIOR CERVICAL DECOMP/DISCECTOMY FUSION N/A 01/18/2020   Procedure: ANTERIOR CERVICAL DECOMPRESSION/DISCECTOMY FUSION 1 LEVEL;  Surgeon: Venetia Night, MD;  Location: [facility] ORS;  Service: Neurosurgery;  Laterality: N/A;  C4-5   CESAREAN SECTION  1982   x 1   COLONOSCOPY WITH PROPOFOL N/A 03/12/2022   Procedure: COLONOSCOPY WITH PROPOFOL;  Surgeon: Toney Reil, MD;  Location[facility] ENDOSCOPY;  Service: Gastroenterology;  Laterality: N/A;   TOTAL HIP ARTHROPLASTY Left 05/12/2018   Procedure: TOTAL HIP ARTHROPLASTY ANTERIOR APPROACH;  Surgeon: Kennedy Bucker, MD;  Location: [facility] ORS;  Service: Orthopedics;  Laterality: Left;   TOTAL KNEE ARTHROPLASTY Right 01/28/2021   Procedure: TOTAL KNEE ARTHROPLASTY;  Surgeon: Kennedy Bucker, MD;  Location: [facility] ORS;  Service: Orthopedics;  Laterality: Right;    Medications Prior to Admission  Medication Sig Dispense Refill Last Dose   amLODipine (NORVASC) 10 MG tablet Take 10 mg by mouth in the morning.   06/08/2022   cholecalciferol (VITAMIN D) 25 MCG (1000 UNIT) tablet Take 2,000 Units by mouth in the morning.   06/08/2022   folic acid (FOLVITE) 1 MG tablet Take 1 mg by mouth in the morning.   06/08/2022   gabapentin (NEURONTIN)  300 MG capsule Take 300 mg by mouth 3 (three) times daily as needed (nerve pain/nerve damage).   06/08/2022   losartan (COZAAR) 100 MG tablet Take 100 mg by mouth in the morning.   06/08/2022   SYNTHROID 88 MCG tablet Take 88 mcg by mouth daily before breakfast.   06/08/2022   VICTOZA 18 MG/3ML SOPN Inject into the skin.   Past Week   enoxaparin (LOVENOX) 40 MG/0.4ML injection Inject 0.4 mLs (40 mg total) into the skin daily for 14 days. 5.6 mL 0    furosemide (LASIX) 20 MG tablet Take 20 mg by mouth as needed. (Patient not taking: Reported on 06/09/2022)   Not Taking    ibuprofen (ADVIL) 800 MG tablet Take 800 mg by mouth 3 (three) times daily.   prn   methotrexate (RHEUMATREX) 2.5 MG tablet Take 15 mg by mouth every Sunday.   06/07/2022   PREMARIN vaginal cream SMARTSIG:1 Vaginal Daily   prn   WEGOVY 0.5 MG/0.5ML SOAJ Inject 0.5 mg into the skin once a week. (Patient not taking: Reported on 06/09/2022)   Not Taking   Social History   Socioeconomic History   Marital status: Single    Spouse name: Not on file   Number of children: 2   Years of education: 12   Highest education level: High school graduate  Occupational History    Employer: [employer]  Tobacco Use   Smoking status: Former    Packs/day: 0.25    Types: Cigarettes    Quit date: 2014    Years since quitting: 9.6   Smokeless tobacco: Never   Tobacco comments:    SMOKED LESS THAN A YEAR   Vaping Use   Vaping Use: Never used  Substance and Sexual Activity   Alcohol use: Yes    Comment: OCC WINE   Drug use: Never   Sexual activity: Yes    Birth control/protection: Post-menopausal  Other Topics Concern   Not on file  Social History Narrative   Not on file   Social Determinants of Health   Financial Resource Strain: Not on file  Food Insecurity: Not on file  Transportation Needs: Not on file  Physical Activity: Not on file  Stress: Not on file  Social Connections: Not on file  Intimate Partner Violence: Not on file    Family History  Problem Relation Age of Onset   Breast cancer Neg Hx       PHYSICAL EXAM General: Pleasant ill-appearing black female, well nourished, in no acute distress.  Laying in incline in ICU bed . HEENT:  Normocephalic and atraumatic. Neck:  No JVD.  Lungs: Conversational dyspnea on oxygen by nasal cannula.  Coarse breath sounds auscultation anteriorly, bibasilar rhonchi, somewhat decreased compared to yesterday. Heart: Tachycardic irregularly irregular rhythm.   S1-S2 present without appreciable murmurs  abdomen: Obese appearing.   Msk: Normal strength and tone for age. Extremities: Warm and well perfused. No clubbing, cyanosis.  Trace lower extremity edema left greater than sign right edema.  Neuro: Alert and oriented X 3. Psych:  Answers questions appropriately.   Labs: Basic Metabolic Panel: Recent Labs    06/08/22 1134 06/09/22 0036  NA 137 137  K 4.1 3.6  CL 104 108  CO2 24 23  GLUCOSE 119* 113*  BUN 13 9  CREATININE 0.69 0.65  CALCIUM 8.9 8.5*  MG  --  1.8    Liver Function Tests: Recent Labs    06/09/22 0036  AST 16  ALT 22  ALKPHOS 62  BILITOT 2.3*  PROT 6.3*  ALBUMIN 3.4*    No results for input(s): "LIPASE", "AMYLASE" in the last 72 hours. CBC: Recent Labs    06/08/22 1134 06/09/22 0036  WBC 14.9* 14.6*  HGB 12.9 12.1  HCT 39.2 37.1  MCV 94.0 94.9  PLT 247 208    Cardiac Enzymes: Recent Labs    06/08/22 2207 06/09/22 0036  TROPONINIHS 10 9    BNP: Invalid input(s): "POCBNP" D-Dimer: No results for input(s): "DDIMER" in the last 72 hours. Hemoglobin A1C: Recent Labs    06/08/22 2208  HGBA1C 5.5    Fasting Lipid Panel: No results for input(s): "CHOL", "HDL", "LDLCALC", "TRIG", "CHOLHDL", "LDLDIRECT" in the last 72 hours. Thyroid Function Tests: Recent Labs    06/08/22 2207  TSH 4.545*    Anemia Panel: No results for input(s): "VITAMINB12", "FOLATE", "FERRITIN", "TIBC", "IRON", "RETICCTPCT" in the last 72 hours.  CT Angio Chest PE W and/or Wo Contrast  Result Date: 06/08/2022 CLINICAL DATA:  Shortness of breath and nausea. 80% O2 sats at room air. EXAM: CT ANGIOGRAPHY CHEST WITH CONTRAST TECHNIQUE: Multidetector CT imaging of the chest was performed using the standard protocol during bolus administration of intravenous contrast. Multiplanar CT image reconstructions and MIPs were obtained to evaluate the vascular anatomy. RADIATION DOSE REDUCTION: This exam was performed according to the departmental dose-optimization program which includes automated  exposure control, adjustment of the mA and/or kV according to patient size and/or use of iterative reconstruction technique. CONTRAST:  52mL OMNIPAQUE IOHEXOL 350 MG/ML SOLN COMPARISON:  Chest radiograph performed earlier on the same date FINDINGS: Cardiovascular: Satisfactory opacification of the pulmonary arteries to the segmental level. No evidence of pulmonary embolism. Normal heart size. No pericardial effusion. Mediastinum/Nodes: No enlarged mediastinal, hilar, or axillary lymph nodes. Thyroid gland, trachea, and esophagus demonstrate no significant findings. Lungs/Pleura: Confluent lung opacities in the upper and lower lobes of the left lung as well as patchy lung opacities in the right lower and dependent portion of the right upper lobe consistent with bilateral pneumonia. Small bilateral pleural effusions, left greater than the right. Azygous right pulmonary lobe. Upper Abdomen: No acute abnormality. Musculoskeletal: No chest wall abnormality. No acute or significant osseous findings. Review of the MIP images confirms the above findings. IMPRESSION: 1.  No evidence of pulmonary embolism. 2. Confluent multifocal opacities in the left upper and lower lobes as well as scattered patchy lung opacities in the right upper and lower lobes consistent with multilobar pneumonia, left worse than the right. Follow-up examination to resolution is recommended. 2.  Small bilateral pleural effusions, left greater than the right. Electronically Signed   By: Larose Hires D.O.   On: 06/08/2022 16:11   DG Chest 2 View  Result Date: 06/08/2022 CLINICAL DATA:  Short of breath EXAM: CHEST - 2 VIEW COMPARISON:  04/04/2020 FINDINGS: Physis are present in the left lung with relative sparing of the apex. Patchy density is also present at the right lung base. Trace pleural effusions. Top-normal heart size. No pneumothorax. IMPRESSION: Pulmonary opacities primarily on the left suspicious for pneumonia. Electronically Signed   By:  Guadlupe Spanish M.D.   On: 06/08/2022 12:17     Radiology: CT Angio Chest PE W and/or Wo Contrast  Result Date: 06/08/2022 CLINICAL DATA:  Shortness of breath and nausea. 80% O2 sats at room air. EXAM: CT ANGIOGRAPHY CHEST WITH CONTRAST TECHNIQUE: Multidetector CT imaging of the chest was performed using the standard protocol during bolus administration  of intravenous contrast. Multiplanar CT image reconstructions and MIPs were obtained to evaluate the vascular anatomy. RADIATION DOSE REDUCTION: This exam was performed according to the departmental dose-optimization program which includes automated exposure control, adjustment of the mA and/or kV according to patient size and/or use of iterative reconstruction technique. CONTRAST:  75mL OMNIPAQUE IOHEXOL 350 MG/ML SOLN COMPARISON:  Chest radiograph performed earlier on the same date FINDINGS: Cardiovascular: Satisfactory opacification of the pulmonary arteries to the segmental level. No evidence of pulmonary embolism. Normal heart size. No pericardial effusion. Mediastinum/Nodes: No enlarged mediastinal, hilar, or axillary lymph nodes. Thyroid gland, trachea, and esophagus demonstrate no significant findings. Lungs/Pleura: Confluent lung opacities in the upper and lower lobes of the left lung as well as patchy lung opacities in the right lower and dependent portion of the right upper lobe consistent with bilateral pneumonia. Small bilateral pleural effusions, left greater than the right. Azygous right pulmonary lobe. Upper Abdomen: No acute abnormality. Musculoskeletal: No chest wall abnormality. No acute or significant osseous findings. Review of the MIP images confirms the above findings. IMPRESSION: 1.  No evidence of pulmonary embolism. 2. Confluent multifocal opacities in the left upper and lower lobes as well as scattered patchy lung opacities in the right upper and lower lobes consistent with multilobar pneumonia, left worse than the right. Follow-up  examination to resolution is recommended. 2.  Small bilateral pleural effusions, left greater than the right. Electronically Signed   By: Larose Hires D.O.   On: 06/08/2022 16:11   DG Chest 2 View  Result Date: 06/08/2022 CLINICAL DATA:  Short of breath EXAM: CHEST - 2 VIEW COMPARISON:  04/04/2020 FINDINGS: Physis are present in the left lung with relative sparing of the apex. Patchy density is also present at the right lung base. Trace pleural effusions. Top-normal heart size. No pneumothorax. IMPRESSION: Pulmonary opacities primarily on the left suspicious for pneumonia. Electronically Signed   By: Guadlupe Spanish M.D.   On: 06/08/2022 12:17    ECHO 12/06/2019 1. Left ventricular ejection fraction, by estimation, is 55 to 60%. The left ventricle has normal function. The left ventricle has no regional wall motion abnormalities. Left ventricular diastolic function could not be evaluated. 2. Right ventricular systolic function is normal. The right ventricular size is normal. There is normal pulmonary artery systolic pressure. 3. The mitral valve is normal in structure and function. No evidence of mitral valve regurgitation. 4. The aortic valve is grossly normal. Aortic valve regurgitation is not visualized. 5. Pulmonic valve regurgitation not assessed. 6. The inferior vena cava is normal in size with greater than 50% respiratory variability, suggesting right atrial pressure of 3 mmHg.  TELEMETRY reviewed by me (LT) 06/10/2022 : Atrial fibrillation rates low 100s to 110s with some periods into the 120s/130s  EKG reviewed by me: Atrial fibrillation rate 116  Data reviewed by me (LT) 06/10/2022: ordered CBC and BMP, telemetry   ASSESSMENT AND PLAN:  Alexandria Moore is a 61yoF with a PMH of hypertension, RA on methotrexate, GERD, hypothyroidism, OSA not on CPAP after losing weight who presented to [facility] ED 06/08/2022 with shortness of breath, was hypoxic to the 80s on room air, CTA chest negative  for PE but showed multilobar pneumonia.  Cardiology is consulted for assistance with her new onset A-fib with RVR.  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  Heart rates initially in the 120s to 130s, much improved after diltiazem drip to interview the afternoon of 8/29 with rates mostly in the 80s to 90s.  We will continue rate control strategy in setting of pneumonia and hypoxia as above. -S/p IV diltiazem 10 mg x 1, 15 mg x 1, started on diltiazem gtt. around 1830 on 8/28. -s/p diltiazem gtt.  -Will give IV metoprolol to 2.5 mg x 1 this morning -Based on echo results we will switch from diltiazem to metoprolol 25 mg every 6 hours for rate control -Continue Eliquis 5 mg twice daily for stroke risk reduction.  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  BNP 529, not very volume overloaded loaded appearing clinically. -We will initiate GDMT with metoprolol 25 mg every 6 hours, will escalate GDMT as her blood pressure allows pending clinical course -Can trial low-dose IV Lasix to see if this helps her respiratory status.  This patient's plan of care was discussed and created with Dr. Juliann Pares and he is in  agreement.  Signed: Rebeca Allegra , PA-C 06/10/2022, 7:51 AM [facility]

## 2022-06-10 NOTE — Assessment & Plan Note (Addendum)
Eliquis for anticoagulation.  With walking heart rate up into the 120s low 130s today.  Case discussed with cardiology and they changed the metoprolol to 100 mg twice a day and recommended as needed Cardizem for fast heart rates.  Follow-up with cardiology as outpatient.

## 2022-06-10 NOTE — TOC Initial Note (Signed)
Transition of Care [facility]) - Initial/Assessment Note    Patient Details  Name: Alexandria Moore MRN: [id number] Date of Birth: 11-29-1960  Transition of Care [facility]) CM/SW Contact:    Allayne Butcher, RN Phone Number: 06/10/2022, 11:01 AM  Clinical Narrative:                  Transition of Care [facility]) Screening Note   Patient Details  Name: Alexandria Moore Date of Birth: 1961-05-09   Transition of Care [facility]) CM/SW Contact:    Allayne Butcher, RN Phone Number: 06/10/2022, 11:01 AM    Transition of Care Department [facility]) has reviewed patient and no TOC needs have been identified at this time. We will continue to monitor patient advancement through interdisciplinary progression rounds. If new patient transition needs arise, please place a TOC consult.    Expected Discharge Plan: Home/Self Care Barriers to Discharge: Continued Medical Work up   Patient Goals and CMS Choice        Expected Discharge Plan and Services Expected Discharge Plan: Home/Self Care                                              Prior Living Arrangements/Services                       Activities of Daily Living      Permission Sought/Granted                  Emotional Assessment              Admission diagnosis:  Acute respiratory failure (HCC) [J96.00] Hypoxia [R09.02] Dyspnea, unspecified type [R06.00] Community acquired pneumonia of left lung, unspecified part of lung [J18.9] Patient Active Problem List   Diagnosis Date Noted   Acute respiratory failure (HCC) 06/08/2022   Total knee replacement status, right 01/31/2021   S/P TKR (total knee replacement) using cement, right 01/28/2021   Cervical myelopathy (HCC) 01/17/2020   Essential hypertension 12/05/2019   Pneumonia due to COVID-19 virus 12/05/2019   Acute respiratory failure with hypoxia (HCC) 12/05/2019   Congestive heart failure (HCC) 12/05/2019   Thrombocytopenia (HCC) 12/05/2019   Elevated  troponin level 12/05/2019   Primary osteoarthritis of both knees 04/25/2019   Primary osteoarthritis involving multiple joints 04/25/2019   Bilateral edema of lower extremity 02/15/2019   Status post total hip replacement, left 05/12/2018   BMI 40.0-44.9, adult (HCC) 04/20/2018   PCP:  Sandrea Hughs, NP Pharmacy:   [facility] [address]), [place] - [address] [place]nn [place]) [place] [address] Phone: [phone] Fax: [phone]  [facility] - [place], [place] - [address] [place] [address] Phone: [phone] Fax: [phone]     Social Determinants of Health (SDOH) Interventions    Readmission Risk Interventions     No data to display

## 2022-06-10 NOTE — Consult Note (Signed)
   Heart Failure Nurse Navigator Note  HFmrEF 40 to 45%.  Left ventricular internal cavity moderately dilated.  Diastolic parameters could not be evaluated. Mild biatrial enlargement.  She presented to the emergency room with complaints of worsening shortness of the breath that began in the a.m.  Comorbidities:  Anxiety Rheumatoid arthritis Hypertension Sleep apnea not on CPAP Hypothyroidism New onset A-fib  Medications:  Apixaban 5 mg 2 times a day Levothyroxine 88 mcg daily Metoprolol tartrate 25 mg every 6 hours Potassium chloride 40 mEq 2 times a day daily  Patient has allergy to lisinopril.  Labs:  Sodium 138, potassium 3.7, chloride 111, CO2 23, BUN 8, creatinine 0.69, GFR greater than 60. Weight is 115 kg Blood pressure 132/86 Intake 1028 mL Output 1550 mL   Initial meeting with patient who was lying quietly in the ICU in no acute distress and her daughter Sharyl Nimrod, on the phone.  Discussed heart failure and what it means the function of her heart.  So discussed the importance of weighing daily and reporting 2 to 3 pound weight gain overnight or 5 pounds within the week or worsening symptoms.  Discussed the importance of being compliant with medications.  Went over 2000 mg sodium restriction and types of foods to avoid.  Also discussed fluid restriction of 64 ounces and what constitutes a liquid.  Also made aware of the outpatient heart failure clinic for which she has an appointment on September 6 at 11:00.  Has 11% no-show which is 3 out of 28 appointments.  She was given the living with heart failure teaching booklet, zone magnet, info on heart failure and sodium along with the nutritional handout heart healthy/reduced sodium therapy.  They had not further questions.  Tresa Endo RN [facility]

## 2022-06-10 NOTE — Progress Notes (Signed)
  Progress Note   Patient: Alexandria Moore ZOX:[id number] DOB: September 03, 1961 DOA: 06/08/2022     2 DOS: the patient was seen and examined on 06/10/2022    Assessment and Plan: * Acute respiratory failure with hypoxia (HCC) Pulse ox of 80% on presentation requiring BiPAP initially.  Patient on 4 L of oxygen this morning with pulse ox ranging between 96 and 99%.  Try to wean oxygen.  Multifocal pneumonia Patient on Rocephin and Zithromax.  COVID test negative, influenza and respiratory panel negative.  Will give incentive spirometer.  And Solu-Medrol since patient still has poor air entry.  Paroxysmal atrial fibrillation with RVR (HCC) Eliquis for anticoagulation.  On metoprolol for rate control.  Will need to ambulate.  With sitting up, heart rate went into the high 120s.  RA (rheumatoid arthritis) (HCC) Patient on methotrexate and folic acid  Hypothyroidism, unspecified On levothyroxine  Immunosuppressed status (HCC) Secondary to rheumatoid arthritis and methotrexate  BMI 40.0-44.9, adult (HCC) BMI 43.52  Essential hypertension Continue metoprolol        Subjective: Patient still has some cough and shortness of breath.  Admitted with pneumonia and was found to be in atrial fibrillation.  Benefits and risks of blood thinner explained to patient and daughter.  Physical Exam: Vitals:   06/10/22 0900 06/10/22 1000 06/10/22 1100 06/10/22 1200  BP:    132/86  Pulse: 63 86 100 (!) 113  Resp: (!) 23 (!) 28 12 19   Temp:      TempSrc:      SpO2: (!) 89% 95% 99% 99%  Weight:      Height:       Physical Exam HENT:     Head: Normocephalic.     Mouth/Throat:     Pharynx: No oropharyngeal exudate.  Eyes:     General: Lids are normal.     Conjunctiva/sclera: Conjunctivae normal.  Cardiovascular:     Rate and Rhythm: Tachycardia present. Rhythm irregularly irregular.     Heart sounds: Normal heart sounds, S1 normal and S2 normal.  Pulmonary:     Breath sounds: Examination of  the right-lower field reveals decreased breath sounds. Examination of the left-lower field reveals decreased breath sounds. Decreased breath sounds present. No wheezing, rhonchi or rales.  Abdominal:     Palpations: Abdomen is soft.     Tenderness: There is no abdominal tenderness.  Musculoskeletal:     Right lower leg: Swelling present.     Left lower leg: Swelling present.  Skin:    General: Skin is warm.     Findings: No rash.  Neurological:     Mental Status: She is alert and oriented to person, place, and time.     Data Reviewed: Creatinine 0.69, white blood cell count 11.1, hemoglobin 11.4  Family Communication: Spoke with daughter at the  Disposition: Status is: Inpatient Remains inpatient appropriate because: Patient still on 4 L of oxygen and does not wear oxygen at home.  Need to control heart rate a little better when she sat up her heart rate went into the 120s.  Still with poor air entry.  Added Solu-Medrol today.  Planned Discharge Destination: Home    Time spent: 28 minutes  Author: , MD 06/10/2022 1:53 PM  For on call review [email or web address].

## 2022-06-10 NOTE — Assessment & Plan Note (Signed)
Patient on methotrexate and folic acid

## 2022-06-10 NOTE — Assessment & Plan Note (Signed)
Secondary to rheumatoid arthritis and methotrexate

## 2022-06-10 NOTE — Assessment & Plan Note (Addendum)
Pulse ox of 80% on presentation requiring BiPAP initially.  Patient on 4 L of oxygen this morning.  Patient walked around with physical therapy and was able to come off oxygen as of 06/11/2022

## 2022-06-10 NOTE — Assessment & Plan Note (Signed)
Continue metoprolol. 

## 2022-06-10 NOTE — Assessment & Plan Note (Addendum)
Patient completed 5 days of Rocephin and Zithromax.  Improved respiratory status after starting Solu-Medrol on 06/10/2022.  Prednisone taper given upon disposition.

## 2022-06-10 NOTE — Progress Notes (Addendum)
PROGRESS NOTE    Alexandria Moore  WGN:[id number] DOB: 1961/09/04 DOA: 06/08/2022 PCP: Sandrea Hughs, NP    Brief Narrative:  61 year old African-American female with history of rheumatoid arthritis, immunosuppression with methotrexate, hypertension, hypothyroid, OSA not on CPAP presents to [facility] with chief complaint of shortness of breath.  Found to be hypoxic on room air.  CTA chest negative for PE, positive for multilobar pneumonia.  On day of admission patient had acute hypoxic respiratory failure requiring initiation of noninvasive positive pressure ventilation.  Admitted to stepdown unit.  Weaned off BiPAP.  Transition to nasal cannula.  Also found to have new onset atrial fibrillation with rapid reticular response.  Cardizem gtt. initiated.  Patient weaned off BiPAP and Cardizem gtt. on 8/29.  Cardiology consulted for management of new onset atrial fibrillation.  Started on anticoagulation.  2D echocardiogram ordered and pending.   Assessment & Plan:   Active Problems:   Essential hypertension   Acute respiratory failure with hypoxia (HCC)   BMI 40.0-44.9, adult (HCC)   Multifocal pneumonia   Paroxysmal atrial fibrillation with RVR (HCC)   RA (rheumatoid arthritis) (HCC)   Immunosuppressed status (HCC)  Multifocal pneumonia Acute hypoxic respiratory failure secondary to above CTA chest with scattered infiltrates Required initiation of NIPPV Admitted to stepdown unit Started broad-spectrum antibiotics Plan: Continue Rocephin and azithromycin Wean off BiPAP to nasal cannula as tolerated Bronchodilator regimen Monitor vitals and fever curve Follow cultures  New onset atrial fibrillation with rapid reticular response Heart rate initially 1 20-1 30s.  Started on diltiazem gtt. Plan: Wean off Cardizem drip P.o. Cardizem 30 mg every 6 hours 2D echo Cardiology consult-medical clinic  Essential hypertension Home BP meds on hold while weaning from Cardizem drip Monitor  and restart as appropriate  Rheumatoid arthritis Chronic immunosuppression PTA methotrexate Treatment for acute illness as above  Hypothyroidism PTA Synthroid Check TSH  OSA Not on home CPAP NIPPV as needed  Morbid obesity BMI 43 Complicates overall care and prognosis      DVT prophylaxis: Eliquis  Code Status: FULL Family Communication:Daughter and son at bedside 8/29 Disposition Plan: Status is: Inpatient Remains inpatient appropriate because: Resp failure, afib RVR   Level of care: Progressive  Consultants:  Cardiology- [facility]  Procedures:  None  Antimicrobials: Rocephin Azithromycin    Subjective: Seen and examined in intensive care unit.  Starting to feel better.  Less respiratory distress.  Successfully weaned off BiPAP.  Objective: Vitals:   06/10/22 0900 06/10/22 1000 06/10/22 1100 06/10/22 1200  BP:    132/86  Pulse: 63 86 100 (!) 113  Resp: (!) 23 (!) Temp:      TempSrc:      SpO2: (!) 89% 95% 99% 99%  Weight:      Height:        Intake/Output Summary (Last 24 hours) at 06/10/2022 1244 Last data filed at 06/10/2022 0444 Gross per 24 hour  Intake 853.56 ml  Output 1550 ml  Net -696.44 ml   Filed Weights   06/08/22 1124 06/08/22 2045 06/10/22 0444  Weight: 105.2 kg 109.6 kg 115 kg    Examination:  General exam: Mildly tachypneic Respiratory system: Tachypnea.  Scattered crackles bilaterally, worse on left, 4 L Cardiovascular system: S1-S2, tachycardic, irregular rhythm, no murmurs, trace pedal edema Gastrointestinal system: Soft, NT/ND, normal bowel sounds Central nervous system: Alert and oriented. No focal neurological deficits. Extremities: Symmetric 5 x 5 power. Skin: No rashes, lesions or ulcers Psychiatry: Judgement and insight appear  normal. Mood & affect appropriate.     Data Reviewed: I have personally reviewed following labs and imaging studies  CBC: Recent Labs  Lab 06/08/22 1134 06/09/22 0036  06/10/22 0836  WBC 14.9* 14.6* 11.1*  HGB 12.9 12.1 11.4*  HCT 39.2 37.1 35.1*  MCV 94.0 94.9 96.7  PLT 247 208 188   Basic Metabolic Panel: Recent Labs  Lab 06/08/22 1134 06/09/22 0036 06/10/22 0836  NA 137 137 138  K 4.1 3.6 3.7  CL 104 108 111  CO2 GLUCOSE 119* 113* 133*  BUN CREATININE 0.69 0.65 0.69  CALCIUM 8.9 8.5* 8.5*  MG  --  1.8  --    GFR: Estimated Creatinine Clearance: 89.5 mL/min (by C-G formula based on SCr of 0.69 mg/dL). Liver Function Tests: Recent Labs  Lab 06/09/22 0036  AST 16  ALT 22  ALKPHOS 62  BILITOT 2.3*  PROT 6.3*  ALBUMIN 3.4*   No results for input(s): "LIPASE", "AMYLASE" in the last 168 hours. No results for input(s): "AMMONIA" in the last 168 hours. Coagulation Profile: Recent Labs  Lab 06/08/22 1530  INR 1.0   Cardiac Enzymes: No results for input(s): "CKTOTAL", "CKMB", "CKMBINDEX", "TROPONINI" in the last 168 hours. BNP (last 3 results) No results for input(s): "PROBNP" in the last 8760 hours. HbA1C: Recent Labs    06/08/22 2208  HGBA1C 5.5   CBG: Recent Labs  Lab 06/08/22 2050 06/09/22 0902 06/10/22 0745  GLUCAP 93 95 151*   Lipid Profile: No results for input(s): "CHOL", "HDL", "LDLCALC", "TRIG", "CHOLHDL", "LDLDIRECT" in the last 72 hours. Thyroid Function Tests: Recent Labs    06/08/22 2207  TSH 4.545*   Anemia Panel: No results for input(s): "VITAMINB12", "FOLATE", "FERRITIN", "TIBC", "IRON", "RETICCTPCT" in the last 72 hours. Sepsis Labs: Recent Labs  Lab 06/08/22 1530 06/08/22 1755 06/08/22 2207  PROCALCITON  --   --  <0.10  LATICACIDVEN 1.6 1.3  --     Recent Results (from the past 240 hour(s))  Blood Culture (routine x 2)     Status: None (Preliminary result)   Collection Time: 06/08/22  3:05 PM   Specimen: BLOOD  Result Value Ref Range Status   Specimen Description BLOOD LEFT ANTECUBITAL  Final   Special Requests   Final    BOTTLES DRAWN AEROBIC AND ANAEROBIC Blood  Culture results may not be optimal due to an inadequate volume of blood received in culture bottles   Culture   Final    NO GROWTH 2 DAYS Performed at [facility], [address], [place], [place] [address]    Report Status PENDING  Incomplete  Blood Culture (routine x 2)     Status: None (Preliminary result)   Collection Time: 06/08/22  3:30 PM   Specimen: BLOOD  Result Value Ref Range Status   Specimen Description BLOOD RIGHT ANTECUBITAL  Final   Special Requests   Final    BOTTLES DRAWN AEROBIC AND ANAEROBIC Blood Culture results may not be optimal due to an inadequate volume of blood received in culture bottles   Culture   Final    NO GROWTH 2 DAYS Performed at [facility], [address] [place], [place] [address]    Report Status PENDING  Incomplete  Resp Panel by RT-PCR (Flu A&B, Covid) Anterior Nasal Swab     Status: None   Collection Time: 06/08/22  3:50 PM   Specimen: Anterior Nasal Swab  Result Value Ref Range  Status   SARS Coronavirus 2 by RT PCR NEGATIVE NEGATIVE Final    Comment: (NOTE) SARS-CoV-2 target nucleic acids are NOT DETECTED.  The SARS-CoV-2 RNA is generally detectable in upper respiratory specimens during the acute phase of infection. The lowest concentration of SARS-CoV-2 viral copies this assay can detect is 138 copies/mL. A negative result does not preclude SARS-Cov-2 infection and should not be used as the sole basis for treatment or other patient management decisions. A negative result may occur with  improper specimen collection/handling, submission of specimen other than nasopharyngeal swab, presence of viral mutation(s) within the areas targeted by this assay, and inadequate number of viral copies(<138 copies/mL). A negative result must be combined with clinical observations, patient history, and epidemiological information. The expected result is Negative.  Fact Sheet for Patients:   BloggerCourse.com  Fact Sheet for Healthcare Providers:  SeriousBroker.it  This test is no t yet approved or cleared by the [place] FDA and  has been authorized for detection and/or diagnosis of SARS-CoV-2 by FDA under an Emergency Use Authorization (EUA). This EUA will remain  in effect (meaning this test can be used) for the duration of the COVID-19 declaration under Section 564(b)(1) of the Act, 21 U.S.C.section 360bbb-3(b)(1), unless the authorization is terminated  or revoked sooner.       Influenza A by PCR NEGATIVE NEGATIVE Final   Influenza B by PCR NEGATIVE NEGATIVE Final    Comment: (NOTE) The Xpert Xpress SARS-CoV-2/FLU/RSV plus assay is intended as an aid in the diagnosis of influenza from Nasopharyngeal swab specimens and should not be used as a sole basis for treatment. Nasal washings and aspirates are unacceptable for Xpert Xpress SARS-CoV-2/FLU/RSV testing.  Fact Sheet for Patients: BloggerCourse.com  Fact Sheet for Healthcare Providers: SeriousBroker.it  This test is not yet approved or cleared by the [place] FDA and has been authorized for detection and/or diagnosis of SARS-CoV-2 by FDA under an Emergency Use Authorization (EUA). This EUA will remain in effect (meaning this test can be used) for the duration of the COVID-19 declaration under Section 564(b)(1) of the Act, 21 U.S.C. section 360bbb-3(b)(1), unless the authorization is terminated or revoked.  Performed at [facility], [address] [place], [place] [address]   MRSA Next Gen by PCR, Nasal     Status: None   Collection Time: 06/08/22  8:50 PM   Specimen: Nasal Mucosa; Nasal Swab  Result Value Ref Range Status   MRSA by PCR Next Gen NOT DETECTED NOT DETECTED Final    Comment: (NOTE) The GeneXpert MRSA Assay (FDA approved for NASAL specimens only), is one component of a  comprehensive MRSA colonization surveillance program. It is not intended to diagnose MRSA infection nor to guide or monitor treatment for MRSA infections. Test performance is not FDA approved in patients less than 85 years old. Performed at [facility], [address] [place], [place] [address]   Respiratory (~20 pathogens) panel by PCR     Status: None   Collection Time: 06/10/22  8:44 AM   Specimen: Nasopharyngeal Swab; Respiratory  Result Value Ref Range Status   Adenovirus NOT DETECTED NOT DETECTED Final   Coronavirus 229E NOT DETECTED NOT DETECTED Final    Comment: (NOTE) The Coronavirus on the Respiratory Panel, DOES NOT test for the novel  Coronavirus (2019 nCoV)    Coronavirus HKU1 NOT DETECTED NOT DETECTED Final   Coronavirus NL63 NOT DETECTED NOT DETECTED Final   Coronavirus OC43 NOT DETECTED NOT DETECTED Final  Metapneumovirus NOT DETECTED NOT DETECTED Final   Rhinovirus / Enterovirus NOT DETECTED NOT DETECTED Final   Influenza A NOT DETECTED NOT DETECTED Final   Influenza B NOT DETECTED NOT DETECTED Final   Parainfluenza Virus 1 NOT DETECTED NOT DETECTED Final   Parainfluenza Virus 2 NOT DETECTED NOT DETECTED Final   Parainfluenza Virus 3 NOT DETECTED NOT DETECTED Final   Parainfluenza Virus 4 NOT DETECTED NOT DETECTED Final   Respiratory Syncytial Virus NOT DETECTED NOT DETECTED Final   Bordetella pertussis NOT DETECTED NOT DETECTED Final   Bordetella Parapertussis NOT DETECTED NOT DETECTED Final   Chlamydophila pneumoniae NOT DETECTED NOT DETECTED Final   Mycoplasma pneumoniae NOT DETECTED NOT DETECTED Final    Comment: Performed at [facility], [address] [place], [place] [address]         Radiology Studies: ECHOCARDIOGRAM COMPLETE  Result Date: 06/10/2022    ECHOCARDIOGRAM REPORT   Patient Name:   BETTYJEAN STEFANSKI Date of Exam: 06/09/2022 Medical Rec #:  [id number]          Height:       64.0 in Accession #:    [id number]          Weight:       241.6 lb Date of Birth:  11/07/1960          BSA:          2.120 m Patient Age:    61 years           BP:           125/73 mmHg Patient Gender: F                  HR:           99 bpm. Exam Location:  [facility] Procedure: 2D Echo, Cardiac Doppler and Color Doppler Indications:     I48.91 Atrial Fibrillation  History:         Patient has prior history of Echocardiogram examinations, most                  recent 12/06/2019. CHF; Risk Factors:Former Smoker, Hypertension                  and Sleep Apnea.  Sonographer:     Daphine Deutscher RDCS Referring Phys:  [id number] Tonna Corner MICHELLE TANG Diagnosing Phys: Alwyn Pea MD IMPRESSIONS  1. Left ventricular ejection fraction, by estimation, is 40 to 45%. The left ventricle has mildly decreased function. The left ventricle demonstrates global hypokinesis. The left ventricular internal cavity size was moderately dilated. Left ventricular diastolic function could not be evaluated.  2. Right ventricular systolic function is low normal. The right ventricular size is mildly enlarged.  3. Left atrial size was mildly dilated.  4. Right atrial size was mildly dilated.  5. The mitral valve is normal in structure. Trivial mitral valve regurgitation.  6. The aortic valve is normal in structure. Aortic valve regurgitation is not visualized. FINDINGS  Left Ventricle: Left ventricular ejection fraction, by estimation, is 40 to 45%. The left ventricle has mildly decreased function. The left ventricle demonstrates global hypokinesis. Definity contrast agent was given IV to delineate the left ventricular  endocardial borders. The left ventricular internal cavity size was moderately dilated. There is no left ventricular hypertrophy. Left ventricular diastolic function could not be evaluated. Right Ventricle: The right ventricular size is mildly enlarged. No increase in right ventricular wall thickness. Right ventricular systolic function is low  normal. Left Atrium: Left  atrial size was mildly dilated. Right Atrium: Right atrial size was mildly dilated. Pericardium: There is no evidence of pericardial effusion. Mitral Valve: The mitral valve is normal in structure. Trivial mitral valve regurgitation. Tricuspid Valve: The tricuspid valve is normal in structure. Tricuspid valve regurgitation is mild. Aortic Valve: The aortic valve is normal in structure. Aortic valve regurgitation is not visualized. Pulmonic Valve: The pulmonic valve was normal in structure. Pulmonic valve regurgitation is not visualized. Aorta: The ascending aorta was not well visualized. IAS/Shunts: No atrial level shunt detected by color flow Doppler.  LEFT VENTRICLE PLAX 2D LVIDd:         5.70 cm LVIDs:         4.40 cm LV PW:         1.10 cm LV IVS:        1.10 cm  RIGHT VENTRICLE            IVC RV Basal diam:  4.60 cm    IVC diam: 2.30 cm RV S prime:     9.91 cm/s TAPSE (M-mode): 2.6 cm LEFT ATRIUM              Index        RIGHT ATRIUM           Index LA diam:        4.60 cm  2.17 cm/m   RA Area:     15.00 cm LA Vol (A2C):   111.0 ml 52.36 ml/m  RA Volume:   43.00 ml  20.28 ml/m LA Vol (A4C):   54.7 ml  25.80 ml/m LA Biplane Vol: 84.9 ml  40.05 ml/m   AORTA Ao Root diam: 3.00 cm MV E velocity: 122.00 cm/s  TRICUSPID VALVE                             TR Peak grad:   9.5 mmHg                             TR Vmax:        154.00 cm/s Alwyn Pea MD Electronically signed by Alwyn Pea MD Signature Date/Time: 06/10/2022/8:20:24 AM    Final    CT Angio Chest PE W and/or Wo Contrast  Result Date: 06/08/2022 CLINICAL DATA:  Shortness of breath and nausea. 80% O2 sats at room air. EXAM: CT ANGIOGRAPHY CHEST WITH CONTRAST TECHNIQUE: Multidetector CT imaging of the chest was performed using the standard protocol during bolus administration of intravenous contrast. Multiplanar CT image reconstructions and MIPs were obtained to evaluate the vascular anatomy. RADIATION DOSE REDUCTION: This exam was  performed according to the departmental dose-optimization program which includes automated exposure control, adjustment of the mA and/or kV according to patient size and/or use of iterative reconstruction technique. CONTRAST:  59mL OMNIPAQUE IOHEXOL 350 MG/ML SOLN COMPARISON:  Chest radiograph performed earlier on the same date FINDINGS: Cardiovascular: Satisfactory opacification of the pulmonary arteries to the segmental level. No evidence of pulmonary embolism. Normal heart size. No pericardial effusion. Mediastinum/Nodes: No enlarged mediastinal, hilar, or axillary lymph nodes. Thyroid gland, trachea, and esophagus demonstrate no significant findings. Lungs/Pleura: Confluent lung opacities in the upper and lower lobes of the left lung as well as patchy lung opacities in the right lower and dependent portion of the right upper lobe consistent with bilateral pneumonia. Small bilateral pleural effusions, left  greater than the right. Azygous right pulmonary lobe. Upper Abdomen: No acute abnormality. Musculoskeletal: No chest wall abnormality. No acute or significant osseous findings. Review of the MIP images confirms the above findings. IMPRESSION: 1.  No evidence of pulmonary embolism. 2. Confluent multifocal opacities in the left upper and lower lobes as well as scattered patchy lung opacities in the right upper and lower lobes consistent with multilobar pneumonia, left worse than the right. Follow-up examination to resolution is recommended. 2.  Small bilateral pleural effusions, left greater than the right. Electronically Signed   By: Larose Hires D.O.   On: 06/08/2022 16:11        Scheduled Meds:  apixaban  5 mg Oral BID   Chlorhexidine Gluconate Cloth  6 each Topical Q0600   folic acid  1 mg Oral q AM   levothyroxine  88 mcg Oral QAC breakfast   [START ON 06/14/2022] methotrexate  15 mg Oral Q Sun   methylPREDNISolone (SOLU-MEDROL) injection  40 mg Intravenous Daily   metoprolol tartrate  25 mg Oral  Q6H   potassium chloride  40 mEq Oral BID   Continuous Infusions:  sodium chloride Stopped (06/09/22 1913)   azithromycin Stopped (06/09/22 1744)   cefTRIAXone (ROCEPHIN)  IV Stopped (06/09/22 1617)     LOS: 2 days     Tresa Moore, MD [facility]   If 7PM-7AM, please contact night-coverage  06/10/2022, 12:44 PM

## 2022-06-11 ENCOUNTER — Telehealth (HOSPITAL_COMMUNITY): Payer: Self-pay | Admitting: Pharmacy Technician

## 2022-06-11 ENCOUNTER — Other Ambulatory Visit (HOSPITAL_COMMUNITY): Payer: Self-pay

## 2022-06-11 DIAGNOSIS — J9601 Acute respiratory failure with hypoxia: Secondary | ICD-10-CM | POA: Diagnosis not present

## 2022-06-11 DIAGNOSIS — M069 Rheumatoid arthritis, unspecified: Secondary | ICD-10-CM | POA: Diagnosis not present

## 2022-06-11 DIAGNOSIS — J189 Pneumonia, unspecified organism: Secondary | ICD-10-CM | POA: Diagnosis not present

## 2022-06-11 DIAGNOSIS — I480 Paroxysmal atrial fibrillation: Secondary | ICD-10-CM | POA: Diagnosis not present

## 2022-06-11 LAB — BASIC METABOLIC PANEL
Anion gap: 6 (ref 5–15)
BUN: 13 mg/dL (ref 8–23)
CO2: 22 mmol/L (ref 22–32)
Calcium: 9 mg/dL (ref 8.9–10.3)
Chloride: 111 mmol/L (ref 98–111)
Creatinine, Ser: 0.66 mg/dL (ref 0.44–1.00)
GFR, Estimated: >60 mL/min (ref >60)
Glucose, Bld: 170 mg/dL — ABNORMAL HIGH (ref 70–99)
Potassium: 4.6 mmol/L (ref 3.5–5.1)
Sodium: 139 mmol/L (ref 135–145)

## 2022-06-11 LAB — GLUCOSE, CAPILLARY
Glucose-Capillary: 130 mg/dL — ABNORMAL HIGH (ref 70–99)
Glucose-Capillary: 134 mg/dL — ABNORMAL HIGH (ref 70–99)
Glucose-Capillary: 151 mg/dL — ABNORMAL HIGH (ref 70–99)

## 2022-06-11 LAB — CBC
HCT: 37.2 % (ref 36.0–46.0)
Hemoglobin: 12.2 g/dL (ref 12.0–15.0)
MCH: 31.1 pg (ref 26.0–34.0)
MCHC: 32.8 g/dL (ref 30.0–36.0)
MCV: 94.9 fL (ref 80.0–100.0)
Platelets: 243 10*3/uL (ref 150–400)
RBC: 3.92 MIL/uL (ref 3.87–5.11)
RDW: 14.8 % (ref 11.5–15.5)
WBC: 13.7 10*3/uL — ABNORMAL HIGH (ref 4.0–10.5)
nRBC: 0 % (ref 0.0–0.2)

## 2022-06-11 LAB — MAGNESIUM: Magnesium: 2.3 mg/dL (ref 1.7–2.4)

## 2022-06-11 MED ORDER — AZITHROMYCIN 250 MG PO TABS
500.0000 mg | ORAL_TABLET | Freq: Every day | ORAL | Status: AC
  Administered 2022-06-11 – 2022-06-12 (×2): 500 mg via ORAL
  Filled 2022-06-11 (×2): qty 2

## 2022-06-11 MED ORDER — FUROSEMIDE 10 MG/ML IJ SOLN
40.0000 mg | Freq: Once | INTRAMUSCULAR | Status: AC
  Administered 2022-06-11: 40 mg via INTRAVENOUS
  Filled 2022-06-11: qty 4

## 2022-06-11 MED ORDER — METOPROLOL TARTRATE 50 MG PO TABS: 75.0000 mg | ORAL_TABLET | Freq: Two times a day (BID) | ORAL | Status: DC

## 2022-06-11 MED ORDER — POLYETHYLENE GLYCOL 3350 17 G PO PACK
17.0000 g | PACK | Freq: Every day | ORAL | Status: DC
  Administered 2022-06-11: 17 g via ORAL
  Filled 2022-06-11 (×2): qty 1

## 2022-06-11 MED ORDER — SPIRONOLACTONE 25 MG PO TABS
12.5000 mg | ORAL_TABLET | Freq: Every day | ORAL | Status: DC
Start: 2022-06-11 — End: 2022-06-12
  Administered 2022-06-11 – 2022-06-12 (×2): 12.5 mg via ORAL
  Filled 2022-06-11: qty 0.5
  Filled 2022-06-11: qty 1
  Filled 2022-06-11: qty 0.5
  Filled 2022-06-11: qty 1

## 2022-06-11 MED ORDER — METOPROLOL TARTRATE 50 MG PO TABS
50.0000 mg | ORAL_TABLET | Freq: Four times a day (QID) | ORAL | Status: DC
  Administered 2022-06-11 – 2022-06-12 (×4): 50 mg via ORAL
  Filled 2022-06-11 (×4): qty 1

## 2022-06-11 MED ORDER — FUROSEMIDE 10 MG/ML IJ SOLN
40.0000 mg | Freq: Once | INTRAMUSCULAR | Status: AC
Start: 2022-06-11 — End: 2022-06-11
  Administered 2022-06-11: 40 mg via INTRAVENOUS
  Filled 2022-06-11: qty 4

## 2022-06-11 NOTE — Progress Notes (Signed)
  Progress Note   Patient: Alexandria Moore MEQ:[id number] DOB: 1960/10/14 DOA: 06/08/2022     3 DOS: the patient was seen and examined on 06/11/2022     Assessment and Plan: * Acute respiratory failure with hypoxia (HCC) Pulse ox of 80% on presentation requiring BiPAP initially.  Patient on 4 L of oxygen this morning.  Patient walked around with physical therapy and was able to come off oxygen today.  Multifocal pneumonia Patient on Rocephin and Zithromax.  Improved respiratory status after starting Solu-Medrol yesterday.  Paroxysmal atrial fibrillation with RVR (HCC) Eliquis for anticoagulation.  With walking heart rate up into the 140s.  Case discussed with cardiology and they increased metoprolol to 50 mg every 6 hours.  RA (rheumatoid arthritis) (HCC) Patient on methotrexate and folic acid  Hypothyroidism, unspecified On levothyroxine  Immunosuppressed status (HCC) Secondary to rheumatoid arthritis and methotrexate  BMI 40.0-44.9, adult (HCC) BMI 42.76  Essential hypertension Continue metoprolol        Subjective: Patient feeling tired with walking today.  Breathing a little bit better.  Still with some shortness of breath with walking.  Still with some cough.  Admitted with pneumonia and rapid atrial fibrillation.  Physical Exam: Vitals:   06/11/22 0811 06/11/22 0945 06/11/22 1113 06/11/22 1310  BP: (!) 127/100  (!) 121/101   Pulse: 63  62 (!) 112  Resp: 19  19   Temp: 97.8 F (36.6 C)  97.8 F (36.6 C)   TempSrc: Oral  Oral   SpO2: 98% 97% 94%   Weight:      Height:       Physical Exam HENT:     Head: Normocephalic.     Mouth/Throat:     Pharynx: No oropharyngeal exudate.  Eyes:     General: Lids are normal.     Conjunctiva/sclera: Conjunctivae normal.  Cardiovascular:     Rate and Rhythm: Tachycardia present. Rhythm irregularly irregular.     Heart sounds: Normal heart sounds, S1 normal and S2 normal.  Pulmonary:     Breath sounds: Examination  of the right-lower field reveals decreased breath sounds. Examination of the left-lower field reveals decreased breath sounds. Decreased breath sounds present. No wheezing, rhonchi or rales.  Abdominal:     Palpations: Abdomen is soft.     Tenderness: There is no abdominal tenderness.  Musculoskeletal:     Right lower leg: Swelling present.     Left lower leg: Swelling present.  Skin:    General: Skin is warm.     Findings: No rash.  Neurological:     Mental Status: She is alert and oriented to person, place, and time.     Data Reviewed: Creatinine 0.66, white blood cell count 13.7, hemoglobin 12.2, platelet count 243 Family Communication: Spoke with daughter at the bedside  Disposition: Status is: Inpatient Remains inpatient appropriate because: Heart rate up into the 140s with ambulation today.  Planned Discharge Destination: Home    Time spent: 28 minutes  Author: Alford Highland, MD 06/11/2022 1:44 PM  For on call review [email or web address].

## 2022-06-11 NOTE — Evaluation (Signed)
Physical Therapy Evaluation Patient Details Name: Alexandria Moore MRN: [id number] DOB: 10-30-1960 Today's Date: 06/11/2022  History of Present Illness  Patient is a 61 year old female presenting with shortness of breath, hypoxic to the 80's on room air, CTA chest negative for PE but shows multilobar pneumonia. New onset A-fib with RVR. History of hypertension, RA on methotrexate, GERD, hypothyroidism, OSA.   Clinical Impression  Patient is agreeable to PT. She is independent and works prior to admission. She lives alone but has supportive family that lives near by.   She was on 4 L02 initially. Started hallway ambulation on the 4 L02 and weaned oxygen to off with the lowest Sp02 reading at 89% while ambulating on room air. After walking, Sp02 was 94% or higher on room air. The patient is modified independent with all activity, including walking in the hallway without assistive device. She was subjectively fatigued after walking but no significant shortness of breath was noted with activity. Educated patient on energy conservation techniques to use in her home setting and setting a routine walking schedule for conditioning at home. PT will follow up while in the hospital. No anticipated PT needs once she discharges from the hospital.      Recommendations for follow up therapy are one component of a multi-disciplinary discharge planning process, led by the attending physician.  Recommendations may be updated based on patient status, additional functional criteria and insurance authorization.  Follow Up Recommendations No PT follow up      Assistance Recommended at Discharge PRN  Patient can return home with the following  Assist for transportation;Help with stairs or ramp for entrance    Equipment Recommendations None recommended by PT  Recommendations for Other Services       Functional Status Assessment Patient has had a recent decline in their functional status and demonstrates the  ability to make significant improvements in function in a reasonable and predictable amount of time.     Precautions / Restrictions Precautions Precautions: Fall Restrictions Weight Bearing Restrictions: No      Mobility  Bed Mobility Overal bed mobility: Modified Independent                  Transfers Overall transfer level: Modified independent                      Ambulation/Gait Ambulation/Gait assistance: Modified independent (Device/Increase time) Gait Distance (Feet): 200 Feet Assistive device: None Gait Pattern/deviations: Step-through pattern Gait velocity: decreased to normal     General Gait Details: patient started on 4 L 02 with Sp02 97%, weaned to 2 L02 with Sp02 93%, turned 02 off with the lowest reading at 89%. cues for breathing techniques provided and tips for energy conservation. with seated rest break, Sp02 sustained at 94% or higher. patient left on room air with Sp02 97% while seated on edge of bed.  Stairs            Wheelchair Mobility    Modified Rankin (Stroke Patients Only)       Balance Overall balance assessment: Needs assistance Sitting-balance support: Feet supported Sitting balance-Leahy Scale: Normal     Standing balance support: No upper extremity supported Standing balance-Leahy Scale: Good Standing balance comment: no loss of balance with ambulation                             Pertinent Vitals/Pain Pain Assessment Pain Assessment: No/denies pain  Home Living Family/patient expects to be discharged to:: Private residence Living Arrangements: Alone Available Help at Discharge: Family;Available PRN/intermittently Type of Home: Mobile home Home Access: Stairs to enter                Prior Function Prior Level of Function : Independent/Modified Independent;Working/employed;Driving                     Hand Dominance        Extremity/Trunk Assessment   Upper Extremity  Assessment Upper Extremity Assessment: Overall WFL for tasks assessed    Lower Extremity Assessment Lower Extremity Assessment: Overall WFL for tasks assessed       Communication   Communication: No difficulties  Cognition Arousal/Alertness: Awake/alert Behavior During Therapy: WFL for tasks assessed/performed Overall Cognitive Status: Within Functional Limits for tasks assessed                                          General Comments General comments (skin integrity, edema, etc.): patient was fatigued with activity compared to baseline. discussed energy conservation techniques to use in home setting.    Exercises     Assessment/Plan    PT Assessment Patient needs continued PT services  PT Problem List Decreased strength       PT Treatment Interventions      PT Goals (Current goals can be found in the Care Plan section)  Acute Rehab PT Goals Patient Stated Goal: to return home PT Goal Formulation: With patient Time For Goal Achievement: 06/25/22 Potential to Achieve Goals: Good    Frequency Min 2X/week     Co-evaluation               AM-PAC PT "6 Clicks" Mobility  Outcome Measure Help needed turning from your back to your side while in a flat bed without using bedrails?: None Help needed moving from lying on your back to sitting on the side of a flat bed without using bedrails?: None Help needed moving to and from a bed to a chair (including a wheelchair)?: None Help needed standing up from a chair using your arms (e.g., wheelchair or bedside chair)?: None Help needed to walk in hospital room?: None Help needed climbing 3-5 steps with a railing? : A Little 6 Click Score: 23    End of Session Equipment Utilized During Treatment: Oxygen Activity Tolerance: Patient tolerated treatment well Patient left: with call bell/phone within reach;with family/visitor present;in bed Nurse Communication: Mobility status (messaged MD, RT, and RN about  Sp02 with mobility and leaving patient on room air) PT Visit Diagnosis: Unsteadiness on feet (R26.81);Muscle weakness (generalized) (M62.81)    Time: 0920-1000 PT Time Calculation (min) (ACUTE ONLY): 40 min   Charges:   PT Evaluation $PT Eval Low Complexity: 1 Low PT Treatments $Therapeutic Activity: 8-22 mins        Donna Bernard, PT, MPT   Ina Homes 06/11/2022, 11:51 AM

## 2022-06-11 NOTE — Progress Notes (Deleted)
Referring Physician:  No referring provider defined for this encounter.  Primary Physician:  Sandrea Hughs, NP  History of Present Illness:  Procedure: C4-5 ACDF Date of procedure: 01/18/2020 Diagnosis: Cervical myelopathy  Last seen by Dr. Myer Haff on 10/03/20 and she was doing well. He noted that her xrays showed a small amount of settling of her graft and has a small amount of bony overgrowth at the inferior margin of the plate.  She was admitted last week for acute respiratory failure with hypoxia, multifocal pneumonia, and afib with RVR. She has known RA.   Cervical xrays on her way out?***   06/11/2022 Ms. Taresa Montville is here today with a chief complaint of ***  Duration: *** Location: *** Quality: *** Severity: ***  Precipitating: aggravated by *** Modifying factors: made better by *** Weakness: none Timing: *** Bowel/Bladder Dysfunction: none  Conservative measures:  Physical therapy: ***  Multimodal medical therapy including regular antiinflammatories: ***  Injections: *** epidural steroid injections  Past Surgery: ***  Lyndee Hensen has ***no symptoms of cervical myelopathy.  The symptoms are causing a significant impact on the patient's life.   Review of Systems:  A 10 point review of systems is negative, except for the pertinent positives and negatives detailed in the HPI.  Past Medical History: Past Medical History:  Diagnosis Date   Anxiety    Arthritis    RA   Borderline diabetes    CHF (congestive heart failure) (HCC)    COVID-19 11/2019   Dyspnea    OFF AND ON SINCE COVID 2021   GERD (gastroesophageal reflux disease)    Goiter    Heart murmur    not being followed or treated. has lost weight with improvement of murmur   History of blood transfusion    unsure of when she had transfusion but states she had a terrible reaction and needed medication   Hypertension    Hyperthyroidism    Hypothyroidism    not on meds at this  time   Medical history non-contributory    Pneumonia 11/2019   Sleep apnea    does not use cpap since weight loss    Past Surgical History: Past Surgical History:  Procedure Laterality Date   ANTERIOR CERVICAL DECOMP/DISCECTOMY FUSION N/A 01/18/2020   Procedure: ANTERIOR CERVICAL DECOMPRESSION/DISCECTOMY FUSION 1 LEVEL;  Surgeon: Venetia Night, MD;  Location: [facility] ORS;  Service: Neurosurgery;  Laterality: N/A;  C4-5   CESAREAN SECTION  1982   x 1   COLONOSCOPY WITH PROPOFOL N/A 03/12/2022   Procedure: COLONOSCOPY WITH PROPOFOL;  Surgeon: Toney Reil, MD;  Location: [facility] ENDOSCOPY;  Service: Gastroenterology;  Laterality: N/A;   TOTAL HIP ARTHROPLASTY Left 05/12/2018   Procedure: TOTAL HIP ARTHROPLASTY ANTERIOR APPROACH;  Surgeon: Kennedy Bucker, MD;  Location: [facility] ORS;  Service: Orthopedics;  Laterality: Left;   TOTAL KNEE ARTHROPLASTY Right 01/28/2021   Procedure: TOTAL KNEE ARTHROPLASTY;  Surgeon: Kennedy Bucker, MD;  Location: [facility] ORS;  Service: Orthopedics;  Laterality: Right;    Allergies: Allergies as of 06/16/2022 - Review Complete 06/08/2022  Allergen Reaction Noted   Hydromorphone Anaphylaxis and Shortness Of Breath 06/03/2016   Lisinopril Anaphylaxis, Nausea And Vomiting, Swelling, and Other (See Comments) 06/03/2016   Meloxicam Anaphylaxis and Other (See Comments) 06/03/2016   Adhesive [tape] Other (See Comments) 05/04/2018    Medications: Facility-Administered Encounter Medications as of 06/16/2022  Medication   0.9 %  sodium chloride infusion   acetaminophen (TYLENOL) tablet 650 mg   Or  acetaminophen (TYLENOL) suppository 650 mg   apixaban (ELIQUIS) tablet 5 mg   azithromycin (ZITHROMAX) tablet 500 mg   cefTRIAXone (ROCEPHIN) 2 g in sodium chloride 0.9 % 100 mL IVPB   Chlorhexidine Gluconate Cloth 2 % PADS 6 each   folic acid (FOLVITE) tablet 1 mg   gabapentin (NEURONTIN) capsule 300 mg   guaiFENesin-dextromethorphan (ROBITUSSIN DM) 100-10 MG/5ML syrup 5  mL   levalbuterol (XOPENEX) nebulizer solution 0.63 mg   levothyroxine (SYNTHROID) tablet 88 mcg   [START ON 06/14/2022] methotrexate (RHEUMATREX) tablet 15 mg   methylPREDNISolone sodium succinate (SOLU-MEDROL) 40 mg/mL injection 40 mg   metoprolol tartrate (LOPRESSOR) tablet 50 mg   ondansetron (ZOFRAN) tablet 4 mg   Or   ondansetron (ZOFRAN) injection 4 mg   Oral care mouth rinse   polyethylene glycol (MIRALAX / GLYCOLAX) packet 17 g   spironolactone (ALDACTONE) tablet 12.5 mg   Outpatient Encounter Medications as of 06/16/2022  Medication Sig   amLODipine (NORVASC) 10 MG tablet Take 10 mg by mouth in the morning.   cholecalciferol (VITAMIN D) 25 MCG (1000 UNIT) tablet Take 2,000 Units by mouth in the morning.   enoxaparin (LOVENOX) 40 MG/0.4ML injection Inject 0.4 mLs (40 mg total) into the skin daily for 14 days.   folic acid (FOLVITE) 1 MG tablet Take 1 mg by mouth in the morning.   furosemide (LASIX) 20 MG tablet Take 20 mg by mouth as needed. (Patient not taking: Reported on 06/09/2022)   gabapentin (NEURONTIN) 300 MG capsule Take 300 mg by mouth 3 (three) times daily as needed (nerve pain/nerve damage).   ibuprofen (ADVIL) 800 MG tablet Take 800 mg by mouth 3 (three) times daily.   losartan (COZAAR) 100 MG tablet Take 100 mg by mouth in the morning.   methotrexate (RHEUMATREX) 2.5 MG tablet Take 15 mg by mouth every Sunday.   PREMARIN vaginal cream SMARTSIG:1 Vaginal Daily   SYNTHROID 88 MCG tablet Take 88 mcg by mouth daily before breakfast.   VICTOZA 18 MG/3ML SOPN Inject into the skin.   WEGOVY 0.5 MG/0.5ML SOAJ Inject 0.5 mg into the skin once a week. (Patient not taking: Reported on 06/09/2022)    Social History: Social History   Tobacco Use   Smoking status: Former    Packs/day: 0.25    Types: Cigarettes    Quit date: 2014    Years since quitting: 9.6   Smokeless tobacco: Never   Tobacco comments:    SMOKED LESS THAN A YEAR   Vaping Use   Vaping Use: Never used   Substance Use Topics   Alcohol use: Yes    Comment: OCC WINE   Drug use: Never    Family Medical History: Family History  Problem Relation Age of Onset   Breast cancer Neg Hx     Physical Examination: There were no vitals filed for this visit.  General: Patient is well developed, well nourished, calm, collected, and in no apparent distress. Attention to examination is appropriate.  Respiratory: Patient is breathing without any difficulty.   NEUROLOGICAL:     Awake, alert, oriented to person, place, and time.  Speech is clear and fluent. Fund of knowledge is appropriate.   Cranial Nerves: Pupils equal round and reactive to light.  Facial tone is symmetric.  Facial sensation is symmetric.  ROM of spine:  *** ROM of cervical spine *** pain *** ROM of lumbar spine *** pain  No abnormal lesions on exposed skin.   Strength: Side Biceps Triceps  Deltoid Interossei Grip Wrist Ext. Wrist Flex.  R 5 5 5 5 5 5 5   L 5 5 5 5 5 5 5    Side Iliopsoas Quads Hamstring PF DF EHL  R 5 5 5 5 5 5   L 5 5 5 5 5 5    Reflexes are ***2+ and symmetric at the biceps, triceps, brachioradialis, patella and achilles.   Hoffman's is absent.  Clonus is not present.   Bilateral upper and lower extremity sensation is intact to light touch.    No evidence of dysmetria noted.  Gait is normal.   ***No difficulty with tandem gait.    Medical Decision Making  Imaging: MRI of cervical spine 10/15/21: *** FINDINGS: Alignment: Mild reversal of the normal cervical lordosis, C3-C5. No significant listhesis.   Vertebrae: No acute fracture or suspicious osseous lesion. Status post ACDF C4-C5.   Cord: Redemonstrated focal T2 hyperintense signal in the right dorsal lateral aspect of the spinal cord (series 8, image [id number]), which appears slightly more well-defined compared to the prior exam, with a suggestion of mild volume loss in this area. Spinal cord is otherwise normal in caliber and signal.    Posterior Fossa, vertebral arteries, paraspinal tissues: Negative.   Disc levels:   C2-C3: No significant disc bulge. Mild left-greater-than-right facet arthropathy. No spinal canal stenosis or neural foraminal narrowing.   C3-C4: Left-greater-than-right disc osteophyte complex. Facet arthropathy. Mild spinal canal stenosis, unchanged. Moderate to severe left neural foraminal narrowing, unchanged.   C4-C5: Status post fusion. No spinal canal stenosis. Uncovertebral and facet arthropathy. Mild left neural foraminal narrowing, unchanged.   C5-C6: Moderate disc bulge. Uncovertebral and facet arthropathy. Mild spinal canal stenosis, unchanged. Moderate left neural foraminal narrowing, unchanged.   C6-C7: Moderate disc bulge. Uncovertebral and facet arthropathy. Mild spinal canal stenosis, unchanged. Mild bilateral neural foraminal narrowing, unchanged.   C7-T1: No significant disc bulge. No spinal canal stenosis or neuroforaminal narrowing.   IMPRESSION: 1. Redemonstrated T2 hyperintense signal in the spinal cord on the right at C4-C5, which may also demonstrate some mild volume loss, consistent with chronic myelomalacia. 2. Mild spinal canal stenosis at C3-C4, C5-C6, and C6-C7. 3. Multilevel uncovertebral and facet arthropathy, which causes moderate to severe neural foraminal narrowing on the left at C3-C4 and moderate left neural foraminal narrowing at C5-C6. Additional, milder neural foraminal narrowing is described above.     Electronically Signed   By: Merilyn Baba M.D.   On: 10/15/2021 13:35   I have personally reviewed the images and agree with the above interpretation.  Assessment and Plan: Ms. Beadnell is a pleasant 61 y.o. female with ***  Above treatment options discussed with patient and following plan made:   - Order for physical therapy for *** spine ***. - Continue on current medications including ***. Reviewed proper dosing along with risks and  benefits. Take and NSAIDs with food.    - Conservative therapy has been ineffective at relieving the symptoms. The patient would likely benefit from surgical intervention.  We discussed **** along with the details of the procedure and the associated risks and benefits. Patient agrees to proceed with above surgery. Given the patients comorbidities, *he will need *** as well as a PAT appt.     I spent a total of *** minutes in face-to-face and non-face-to-face activities related to this patient's care today.  Thank you for involving me in the care of this patient.   Geronimo Boot PA-C Dept. of Neurosurgery

## 2022-06-11 NOTE — Progress Notes (Addendum)
Patient was seen evaluated and examined by me and the PA on 06/11/22.  Course of action, evaluation, and management decisions were developed solely by me, but detailed below in the PA's note.  [facility] CLINIC CARDIOLOGY CONSULT NOTE       Patient ID: Alexandria Moore MRN: [id number] DOB/AGE: 63-Jun-1962 63 y.o.  Admit date: 06/08/2022 Referring Physician Dr. Georgeann Oppenheim  Primary Physician Phineas Real [facility] Primary Cardiologist none Reason for Consultation new onset paroxysmal AF RVR  HPI: Alexandria Moore is a 61yoF with a PMH of hypertension, RA on methotrexate, GERD, hypothyroidism, OSA not on CPAP after losing weight who presented to [facility] ED 06/08/2022 with shortness of breath, was hypoxic to the 80s on room air, CTA chest negative for PE but showed multilobar pneumonia.  Cardiology is consulted for assistance with her new onset A-fib with RVR.  Echo this admission also showed a moderately reduced EF 40 to 45%.  Interval History: -transferred to the PCU from stepdown -Remains in atrial fibrillation with rates mostly in the 110s, increases with activity -Continues to feel better.  Was able to get up to wash her face, use a bedside commode without assistance. -Remains on supplemental oxygen, denies chest pain, palpitations, dizziness.   Review of systems complete and found to be negative unless listed above     Past Medical History:  Diagnosis Date   Anxiety    Arthritis    RA   Borderline diabetes    CHF (congestive heart failure) (HCC)    COVID-19 11/2019   Dyspnea    OFF AND ON SINCE COVID 2021   GERD (gastroesophageal reflux disease)    Goiter    Heart murmur    not being followed or treated. has lost weight with improvement of murmur   History of blood transfusion    unsure of when she had transfusion but states she had a terrible reaction and needed medication   Hypertension    Hyperthyroidism    Hypothyroidism    not on meds at this time    Medical history non-contributory    Pneumonia 11/2019   Sleep apnea    does not use cpap since weight loss    Past Surgical History:  Procedure Laterality Date   ANTERIOR CERVICAL DECOMP/DISCECTOMY FUSION N/A 01/18/2020   Procedure: ANTERIOR CERVICAL DECOMPRESSION/DISCECTOMY FUSION 1 LEVEL;  Surgeon: Venetia Night, MD;  Location: [facility] ORS;  Service: Neurosurgery;  Laterality: N/A;  C4-5   CESAREAN SECTION  1982   x 1   COLONOSCOPY WITH PROPOFOL N/A 03/12/2022   Procedure: COLONOSCOPY WITH PROPOFOL;  Surgeon: Toney Reil, MD;  Location[facility] ENDOSCOPY;  Service: Gastroenterology;  Laterality: N/A;   TOTAL HIP ARTHROPLASTY Left 05/12/2018   Procedure: TOTAL HIP ARTHROPLASTY ANTERIOR APPROACH;  Surgeon: Kennedy Bucker, MD;  Location: [facility] ORS;  Service: Orthopedics;  Laterality: Left;   TOTAL KNEE ARTHROPLASTY Right 01/28/2021   Procedure: TOTAL KNEE ARTHROPLASTY;  Surgeon: Kennedy Bucker, MD;  Location: [facility] ORS;  Service: Orthopedics;  Laterality: Right;    Medications Prior to Admission  Medication Sig Dispense Refill Last Dose   amLODipine (NORVASC) 10 MG tablet Take 10 mg by mouth in the morning.   06/08/2022   cholecalciferol (VITAMIN D) 25 MCG (1000 UNIT) tablet Take 2,000 Units by mouth in the morning.   06/08/2022   folic acid (FOLVITE) 1 MG tablet Take 1 mg by mouth in the morning.   06/08/2022   gabapentin (NEURONTIN) 300 MG capsule Take 300 mg by  mouth 3 (three) times daily as needed (nerve pain/nerve damage).   06/08/2022   losartan (COZAAR) 100 MG tablet Take 100 mg by mouth in the morning.   06/08/2022   SYNTHROID 88 MCG tablet Take 88 mcg by mouth daily before breakfast.   06/08/2022   VICTOZA 18 MG/3ML SOPN Inject into the skin.   Past Week   enoxaparin (LOVENOX) 40 MG/0.4ML injection Inject 0.4 mLs (40 mg total) into the skin daily for 14 days. 5.6 mL 0    furosemide (LASIX) 20 MG tablet Take 20 mg by mouth as needed. (Patient not taking: Reported on 06/09/2022)   Not Taking    ibuprofen (ADVIL) 800 MG tablet Take 800 mg by mouth 3 (three) times daily.   prn   methotrexate (RHEUMATREX) 2.5 MG tablet Take 15 mg by mouth every Sunday.   06/07/2022   PREMARIN vaginal cream SMARTSIG:1 Vaginal Daily   prn   WEGOVY 0.5 MG/0.5ML SOAJ Inject 0.5 mg into the skin once a week. (Patient not taking: Reported on 06/09/2022)   Not Taking   Social History   Socioeconomic History   Marital status: Single    Spouse name: Not on file   Number of children: 2   Years of education: 12   Highest education level: High school graduate  Occupational History    Employer: [employer]  Tobacco Use   Smoking status: Former    Packs/day: 0.25    Types: Cigarettes    Quit date: 2014    Years since quitting: 9.6   Smokeless tobacco: Never   Tobacco comments:    SMOKED LESS THAN A YEAR   Vaping Use   Vaping Use: Never used  Substance and Sexual Activity   Alcohol use: Yes    Comment: OCC WINE   Drug use: Never   Sexual activity: Yes    Birth control/protection: Post-menopausal  Other Topics Concern   Not on file  Social History Narrative   Not on file   Social Determinants of Health   Financial Resource Strain: Not on file  Food Insecurity: Not on file  Transportation Needs: Not on file  Physical Activity: Not on file  Stress: Not on file  Social Connections: Not on file  Intimate Partner Violence: Not on file    Family History  Problem Relation Age of Onset   Breast cancer Neg Hx       PHYSICAL EXAM General: Pleasant ill-appearing black female, well nourished, in no acute distress.  Sitting upright in PCU bed HEENT:  Normocephalic and atraumatic. Neck:  No JVD.  Lungs: Normal respiratory effort on oxygen by nasal cannula..  Much improved aeration without appreciable crackles or wheezes. Heart: Tachycardic irregularly irregular rhythm.   S1-S2 present without appreciable murmurs  abdomen: Obese appearing.  Msk: Normal strength and tone for  age. Extremities: Warm and well perfused. No clubbing, cyanosis.  Trace lower extremity edema left greater than sign right edema.  Neuro: Alert and oriented X 3. Psych:  Answers questions appropriately.   Labs: Basic Metabolic Panel: Recent Labs    06/09/22 0036 06/10/22 0836 06/11/22 0708  NA 137 138 139  K 3.6 3.7 4.6  CL 108 111 111  CO2 23 23 22   GLUCOSE 113* 133* 170*  BUN 9 8 13   CREATININE 0.65 0.69 0.66  CALCIUM 8.5* 8.5* 9.0  MG 1.8  --   --     Liver Function Tests: Recent Labs    06/09/22 0036  AST  16  ALT 22  ALKPHOS 62  BILITOT 2.3*  PROT 6.3*  ALBUMIN 3.4*    No results for input(s): "LIPASE", "AMYLASE" in the last 72 hours. CBC: Recent Labs    06/10/22 0836 06/11/22 0708  WBC 11.1* 13.7*  HGB 11.4* 12.2  HCT 35.1* 37.2  MCV 96.7 94.9  PLT 188 243    Cardiac Enzymes: Recent Labs    06/08/22 2207 06/09/22 0036  TROPONINIHS 10 9    BNP: Invalid input(s): "POCBNP" D-Dimer: No results for input(s): "DDIMER" in the last 72 hours. Hemoglobin A1C: Recent Labs    06/08/22 2208  HGBA1C 5.5    Fasting Lipid Panel: No results for input(s): "CHOL", "HDL", "LDLCALC", "TRIG", "CHOLHDL", "LDLDIRECT" in the last 72 hours. Thyroid Function Tests: Recent Labs    06/08/22 2207  TSH 4.545*    Anemia Panel: No results for input(s): "VITAMINB12", "FOLATE", "FERRITIN", "TIBC", "IRON", "RETICCTPCT" in the last 72 hours.  ECHOCARDIOGRAM COMPLETE  Result Date: 06/10/2022    ECHOCARDIOGRAM REPORT   Patient Name:   Alexandria Moore Date of Exam: 06/09/2022 Medical Rec #:  [id number]          Height:       64.0 in Accession #:    [id number]         Weight:       241.6 lb Date of Birth:  1961/08/30          BSA:          2.120 m Patient Age:    61 years           BP:           125/73 mmHg Patient Gender: F                  HR:           99 bpm. Exam Location:  [facility] Procedure: 2D Echo, Cardiac Doppler and Color Doppler Indications:     I48.91 Atrial  Fibrillation  History:         Patient has prior history of Echocardiogram examinations, most                  recent 12/06/2019. CHF; Risk Factors:Former Smoker, Hypertension                  and Sleep Apnea.  Sonographer:     Daphine Deutscher RDCS Referring Phys:  [id number] Tonna Corner MICHELLE TANG Diagnosing Phys: Alwyn Pea MD IMPRESSIONS  1. Left ventricular ejection fraction, by estimation, is 40 to 45%. The left ventricle has mildly decreased function. The left ventricle demonstrates global hypokinesis. The left ventricular internal cavity size was moderately dilated. Left ventricular diastolic function could not be evaluated.  2. Right ventricular systolic function is low normal. The right ventricular size is mildly enlarged.  3. Left atrial size was mildly dilated.  4. Right atrial size was mildly dilated.  5. The mitral valve is normal in structure. Trivial mitral valve regurgitation.  6. The aortic valve is normal in structure. Aortic valve regurgitation is not visualized. FINDINGS  Left Ventricle: Left ventricular ejection fraction, by estimation, is 40 to 45%. The left ventricle has mildly decreased function. The left ventricle demonstrates global hypokinesis. Definity contrast agent was given IV to delineate the left ventricular  endocardial borders. The left ventricular internal cavity size was moderately dilated. There is no left ventricular hypertrophy. Left ventricular diastolic function could not be evaluated. Right Ventricle: The right ventricular size  is mildly enlarged. No increase in right ventricular wall thickness. Right ventricular systolic function is low normal. Left Atrium: Left atrial size was mildly dilated. Right Atrium: Right atrial size was mildly dilated. Pericardium: There is no evidence of pericardial effusion. Mitral Valve: The mitral valve is normal in structure. Trivial mitral valve regurgitation. Tricuspid Valve: The tricuspid valve is normal in structure. Tricuspid valve  regurgitation is mild. Aortic Valve: The aortic valve is normal in structure. Aortic valve regurgitation is not visualized. Pulmonic Valve: The pulmonic valve was normal in structure. Pulmonic valve regurgitation is not visualized. Aorta: The ascending aorta was not well visualized. IAS/Shunts: No atrial level shunt detected by color flow Doppler.  LEFT VENTRICLE PLAX 2D LVIDd:         5.70 cm LVIDs:         4.40 cm LV PW:         1.10 cm LV IVS:        1.10 cm  RIGHT VENTRICLE            IVC RV Basal diam:  4.60 cm    IVC diam: 2.30 cm RV S prime:     9.91 cm/s TAPSE (M-mode): 2.6 cm LEFT ATRIUM              Index        RIGHT ATRIUM           Index LA diam:        4.60 cm  2.17 cm/m   RA Area:     15.00 cm LA Vol (A2C):   111.0 ml 52.36 ml/m  RA Volume:   43.00 ml  20.28 ml/m LA Vol (A4C):   54.7 ml  25.80 ml/m LA Biplane Vol: 84.9 ml  40.05 ml/m   AORTA Ao Root diam: 3.00 cm MV E velocity: 122.00 cm/s  TRICUSPID VALVE                             TR Peak grad:   9.5 mmHg                             TR Vmax:        154.00 cm/s Alwyn Pea MD Electronically signed by Alwyn Pea MD Signature Date/Time: 06/10/2022/8:20:24 AM    Final      Radiology: ECHOCARDIOGRAM COMPLETE  Result Date: 06/10/2022    ECHOCARDIOGRAM REPORT   Patient Name:   Alexandria Moore Date of Exam: 06/09/2022 Medical Rec #:  [id number]          Height:       64.0 in Accession #:    [id number]         Weight:       241.6 lb Date of Birth:  Feb 11, 1961          BSA:          2.120 m Patient Age:    61 years           BP:           125/73 mmHg Patient Gender: F                  HR:           99 bpm. Exam Location:  [facility] Procedure: 2D Echo, Cardiac Doppler and Color Doppler Indications:     I48.91 Atrial Fibrillation  History:         Patient has prior history of Echocardiogram examinations, most                  recent 12/06/2019. CHF; Risk Factors:Former Smoker, Hypertension                  and Sleep Apnea.  Sonographer:      Daphine Deutscher RDCS Referring Phys:  [id number] Tonna Corner MICHELLE TANG Diagnosing Phys: Alwyn Pea MD IMPRESSIONS  1. Left ventricular ejection fraction, by estimation, is 40 to 45%. The left ventricle has mildly decreased function. The left ventricle demonstrates global hypokinesis. The left ventricular internal cavity size was moderately dilated. Left ventricular diastolic function could not be evaluated.  2. Right ventricular systolic function is low normal. The right ventricular size is mildly enlarged.  3. Left atrial size was mildly dilated.  4. Right atrial size was mildly dilated.  5. The mitral valve is normal in structure. Trivial mitral valve regurgitation.  6. The aortic valve is normal in structure. Aortic valve regurgitation is not visualized. FINDINGS  Left Ventricle: Left ventricular ejection fraction, by estimation, is 40 to 45%. The left ventricle has mildly decreased function. The left ventricle demonstrates global hypokinesis. Definity contrast agent was given IV to delineate the left ventricular  endocardial borders. The left ventricular internal cavity size was moderately dilated. There is no left ventricular hypertrophy. Left ventricular diastolic function could not be evaluated. Right Ventricle: The right ventricular size is mildly enlarged. No increase in right ventricular wall thickness. Right ventricular systolic function is low normal. Left Atrium: Left atrial size was mildly dilated. Right Atrium: Right atrial size was mildly dilated. Pericardium: There is no evidence of pericardial effusion. Mitral Valve: The mitral valve is normal in structure. Trivial mitral valve regurgitation. Tricuspid Valve: The tricuspid valve is normal in structure. Tricuspid valve regurgitation is mild. Aortic Valve: The aortic valve is normal in structure. Aortic valve regurgitation is not visualized. Pulmonic Valve: The pulmonic valve was normal in structure. Pulmonic valve regurgitation is not  visualized. Aorta: The ascending aorta was not well visualized. IAS/Shunts: No atrial level shunt detected by color flow Doppler.  LEFT VENTRICLE PLAX 2D LVIDd:         5.70 cm LVIDs:         4.40 cm LV PW:         1.10 cm LV IVS:        1.10 cm  RIGHT VENTRICLE            IVC RV Basal diam:  4.60 cm    IVC diam: 2.30 cm RV S prime:     9.91 cm/s TAPSE (M-mode): 2.6 cm LEFT ATRIUM              Index        RIGHT ATRIUM           Index LA diam:        4.60 cm  2.17 cm/m   RA Area:     15.00 cm LA Vol (A2C):   111.0 ml 52.36 ml/m  RA Volume:   43.00 ml  20.28 ml/m LA Vol (A4C):   54.7 ml  25.80 ml/m LA Biplane Vol: 84.9 ml  40.05 ml/m   AORTA Ao Root diam: 3.00 cm MV E velocity: 122.00 cm/s  TRICUSPID VALVE  TR Peak grad:   9.5 mmHg                             TR Vmax:        154.00 cm/s Alwyn Pea MD Electronically signed by Alwyn Pea MD Signature Date/Time: 06/10/2022/8:20:24 AM    Final    CT Angio Chest PE W and/or Wo Contrast  Result Date: 06/08/2022 CLINICAL DATA:  Shortness of breath and nausea. 80% O2 sats at room air. EXAM: CT ANGIOGRAPHY CHEST WITH CONTRAST TECHNIQUE: Multidetector CT imaging of the chest was performed using the standard protocol during bolus administration of intravenous contrast. Multiplanar CT image reconstructions and MIPs were obtained to evaluate the vascular anatomy. RADIATION DOSE REDUCTION: This exam was performed according to the departmental dose-optimization program which includes automated exposure control, adjustment of the mA and/or kV according to patient size and/or use of iterative reconstruction technique. CONTRAST:  59mL OMNIPAQUE IOHEXOL 350 MG/ML SOLN COMPARISON:  Chest radiograph performed earlier on the same date FINDINGS: Cardiovascular: Satisfactory opacification of the pulmonary arteries to the segmental level. No evidence of pulmonary embolism. Normal heart size. No pericardial effusion. Mediastinum/Nodes: No  enlarged mediastinal, hilar, or axillary lymph nodes. Thyroid gland, trachea, and esophagus demonstrate no significant findings. Lungs/Pleura: Confluent lung opacities in the upper and lower lobes of the left lung as well as patchy lung opacities in the right lower and dependent portion of the right upper lobe consistent with bilateral pneumonia. Small bilateral pleural effusions, left greater than the right. Azygous right pulmonary lobe. Upper Abdomen: No acute abnormality. Musculoskeletal: No chest wall abnormality. No acute or significant osseous findings. Review of the MIP images confirms the above findings. IMPRESSION: 1.  No evidence of pulmonary embolism. 2. Confluent multifocal opacities in the left upper and lower lobes as well as scattered patchy lung opacities in the right upper and lower lobes consistent with multilobar pneumonia, left worse than the right. Follow-up examination to resolution is recommended. 2.  Small bilateral pleural effusions, left greater than the right. Electronically Signed   By: Larose Hires D.O.   On: 06/08/2022 16:11   DG Chest 2 View  Result Date: 06/08/2022 CLINICAL DATA:  Short of breath EXAM: CHEST - 2 VIEW COMPARISON:  04/04/2020 FINDINGS: Physis are present in the left lung with relative sparing of the apex. Patchy density is also present at the right lung base. Trace pleural effusions. Top-normal heart size. No pneumothorax. IMPRESSION: Pulmonary opacities primarily on the left suspicious for pneumonia. Electronically Signed   By: Guadlupe Spanish M.D.   On: 06/08/2022 12:17    ECHO 12/06/2019 1. Left ventricular ejection fraction, by estimation, is 55 to 60%. The left ventricle has normal function. The left ventricle has no regional wall motion abnormalities. Left ventricular diastolic function could not be evaluated. 2. Right ventricular systolic function is normal. The right ventricular size is normal. There is normal pulmonary artery systolic pressure. 3. The  mitral valve is normal in structure and function. No evidence of mitral valve regurgitation. 4. The aortic valve is grossly normal. Aortic valve regurgitation is not visualized. 5. Pulmonic valve regurgitation not assessed. 6. The inferior vena cava is normal in size with greater than 50% respiratory variability, suggesting right atrial pressure of 3 mmHg.  TELEMETRY reviewed by me (LT) 06/11/2022 : Atrial fibrillation rates 110s, increased to the 120s with activity.  EKG reviewed by me: Atrial fibrillation rate 116  Data reviewed  by me (LT) 06/11/2022: Hospitalist note, heart failure nurse navigator note, CBC, BMP, respiratory panel, telemetry, vitals   ASSESSMENT AND PLAN:  Layton Tappan is a 61yoF with a PMH of hypertension, RA on methotrexate, GERD, hypothyroidism, OSA not on CPAP after losing weight who presented to [facility] ED 06/08/2022 with shortness of breath, was hypoxic to the 80s on room air, CTA chest negative for PE but showed multilobar pneumonia.  Cardiology is consulted for assistance with her new onset A-fib with RVR.  Echo this admission also showed a moderately reduced EF 40 to 45%.  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  Heart rates initially in the 120s to 130s, much improved after diltiazem drip to interview the afternoon of 8/29 with rates mostly in the 80s to 90s.  We will continue rate control strategy in setting of pneumonia and hypoxia as above. -S/p IV diltiazem 10 mg x 1, 15 mg x 1, started on  diltiazem gtt. around 1830 on 8/28. -s/p diltiazem gtt.  -S/p IV metoprolol to 2.5 mg x 1 on 8/30 -ADDENDUM: HR increasing with ambulation , will increase metoprolol to  q6h, hopefully consolidate to  BID at discharge -Continue Eliquis 5 mg twice daily for stroke risk reduction.  CHA2DS2-VASc 2 (sex, htn) -Hold home amlodipine -Telemetry monitoring -Risk factors: A1c 5.5, TSH okay at 4.5 -Consideration for DCCV: Defer at this time due to ongoing pneumonia, oxygen requirement.  Will consider after at least 3 weeks of anticoagulation after outpatient follow-up.  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  BNP 529, not very volume overloaded loaded appearing, but respiratory status improving after IV Lasix yesterday.   -GDMT with metoprolol 75 mg twice a day, add low-dose spironolactone 12.5 mg once daily  -Will consider addition of Jardiance tomorrow, defer ACE/ARB/Entresto due to history of angioedema with lisinopril -s/p IV Lasix 20 mg x 1, will dose another 40 mg IV x 2 today  This patient's plan of care was discussed and created with Dr. Juliann Pares and he is in agreement.  Signed: Rebeca Allegra , PA-C 06/11/2022, 8:18 AM [facility]

## 2022-06-11 NOTE — Telephone Encounter (Signed)
Pharmacy Patient Advocate Encounter  Insurance verification completed.    The patient is insured through [employer]   The patient is currently admitted and ran test claims for the following: Eliquis.  Copays and coinsurance results were relayed to Inpatient clinical team.      

## 2022-06-11 NOTE — TOC Benefit Eligibility Note (Signed)
Patient Product/process development scientist completed.    The patient is currently admitted and upon discharge could be taking Eliquis 5 mg.  The current 30 day co-pay is $45.00.   The patient is insured through [employer]    Roland Earl, CPhT Pharmacy Patient Advocate Specialist [facility] Patient Advocate Team Direct Number: [phone]  Fax: [phone]

## 2022-06-12 DIAGNOSIS — J189 Pneumonia, unspecified organism: Secondary | ICD-10-CM | POA: Diagnosis not present

## 2022-06-12 DIAGNOSIS — M069 Rheumatoid arthritis, unspecified: Secondary | ICD-10-CM | POA: Diagnosis not present

## 2022-06-12 DIAGNOSIS — J9601 Acute respiratory failure with hypoxia: Secondary | ICD-10-CM | POA: Diagnosis not present

## 2022-06-12 DIAGNOSIS — I480 Paroxysmal atrial fibrillation: Secondary | ICD-10-CM | POA: Diagnosis not present

## 2022-06-12 LAB — CBC
HCT: 35.6 % — ABNORMAL LOW (ref 36.0–46.0)
Hemoglobin: 11.6 g/dL — ABNORMAL LOW (ref 12.0–15.0)
MCH: 30.5 pg (ref 26.0–34.0)
MCHC: 32.6 g/dL (ref 30.0–36.0)
MCV: 93.7 fL (ref 80.0–100.0)
Platelets: 262 10*3/uL (ref 150–400)
RBC: 3.8 MIL/uL — ABNORMAL LOW (ref 3.87–5.11)
RDW: 14.5 % (ref 11.5–15.5)
WBC: 16.5 10*3/uL — ABNORMAL HIGH (ref 4.0–10.5)
nRBC: 0 % (ref 0.0–0.2)

## 2022-06-12 LAB — BASIC METABOLIC PANEL
Anion gap: 4 — ABNORMAL LOW (ref 5–15)
BUN: 19 mg/dL (ref 8–23)
CO2: 23 mmol/L (ref 22–32)
Calcium: 9 mg/dL (ref 8.9–10.3)
Chloride: 113 mmol/L — ABNORMAL HIGH (ref 98–111)
Creatinine, Ser: 0.78 mg/dL (ref 0.44–1.00)
GFR, Estimated: >60 mL/min (ref >60)
Glucose, Bld: 118 mg/dL — ABNORMAL HIGH (ref 70–99)
Potassium: 4.1 mmol/L (ref 3.5–5.1)
Sodium: 140 mmol/L (ref 135–145)

## 2022-06-12 LAB — GLUCOSE, CAPILLARY: Glucose-Capillary: 138 mg/dL — ABNORMAL HIGH (ref 70–99)

## 2022-06-12 MED ORDER — METOPROLOL TARTRATE 100 MG PO TABS: 100.0000 mg | ORAL_TABLET | Freq: Two times a day (BID) | ORAL | 0 refills | Status: AC

## 2022-06-12 MED ORDER — METOPROLOL TARTRATE 50 MG PO TABS: 100.0000 mg | ORAL_TABLET | Freq: Two times a day (BID) | ORAL | Status: DC

## 2022-06-12 MED ORDER — SPIRONOLACTONE 25 MG PO TABS: 12.5000 mg | ORAL_TABLET | Freq: Every day | ORAL | 0 refills | Status: AC

## 2022-06-12 MED ORDER — DILTIAZEM HCL 30 MG PO TABS: 30.0000 mg | ORAL_TABLET | Freq: Four times a day (QID) | ORAL | Status: DC | PRN

## 2022-06-12 MED ORDER — SODIUM CHLORIDE 0.9 % IV SOLN
2.0000 g | Freq: Once | INTRAVENOUS | Status: AC
  Administered 2022-06-12: 2 g via INTRAVENOUS

## 2022-06-12 MED ORDER — POLYETHYLENE GLYCOL 3350 17 G PO PACK: 17.0000 g | PACK | Freq: Every day | ORAL | 0 refills | Status: AC | PRN

## 2022-06-12 MED ORDER — METOPROLOL TARTRATE 50 MG PO TABS
50.0000 mg | ORAL_TABLET | Freq: Once | ORAL | Status: AC
  Administered 2022-06-12: 50 mg via ORAL
  Filled 2022-06-12: qty 1

## 2022-06-12 MED ORDER — APIXABAN 5 MG PO TABS: 5.0000 mg | ORAL_TABLET | Freq: Two times a day (BID) | ORAL | 0 refills | Status: AC

## 2022-06-12 MED ORDER — PREDNISONE 10 MG PO TABS: ORAL_TABLET | ORAL | 0 refills | Status: DC

## 2022-06-12 MED ORDER — DILTIAZEM HCL 30 MG PO TABS: 30.0000 mg | ORAL_TABLET | Freq: Four times a day (QID) | ORAL | 0 refills | Status: AC | PRN

## 2022-06-12 NOTE — Progress Notes (Addendum)
[facility] CLINIC CARDIOLOGY CONSULT NOTE       Patient ID: Alexandria Moore MRN: [id number] DOB/AGE: 1961-06-30 61 y.o.  Admit date: 06/08/2022 Referring Physician Dr. Georgeann Oppenheim  Primary Physician [facility] Primary Cardiologist none Reason for Consultation new onset paroxysmal AF RVR  HPI: Alexandria Moore is a 61yoF with a PMH of hypertension, RA on methotrexate, GERD, hypothyroidism, OSA not on CPAP after losing weight who presented to [facility] ED 06/08/2022 with shortness of breath, was hypoxic to the 80s on room air, CTA chest negative for PE but showed multilobar pneumonia.  Cardiology is consulted for assistance with her new onset A-fib with RVR.  Echo this admission also showed a moderately reduced EF 40 to 45%.  Interval History: -Continues to feel better, weaned off supplemental oxygen yesterday  -Denies chest pain, worsening shortness of breath, palpitations, dizziness. -Remains in atrial fibrillation with heart rates in the 100s to 110s at rest, increases to the 120s 130s with ambulation.   Review of systems complete and found to be negative unless listed above   Past Medical History:  Diagnosis Date   Anxiety    Arthritis    RA   Borderline diabetes    CHF (congestive heart failure) (HCC)    COVID-19 11/2019   Dyspnea    OFF AND ON SINCE COVID 2021   GERD (gastroesophageal reflux disease)    Goiter    Heart murmur    not being followed or treated. has lost weight with improvement of murmur   History of blood transfusion    unsure of when she had transfusion but states she had a terrible reaction and needed medication   Hypertension    Hyperthyroidism    Hypothyroidism    not on meds at this time   Medical history non-contributory    Pneumonia 11/2019   Sleep apnea    does not use cpap since weight loss    Past Surgical History:  Procedure Laterality Date   ANTERIOR CERVICAL DECOMP/DISCECTOMY FUSION N/A 01/18/2020   Procedure:  ANTERIOR CERVICAL DECOMPRESSION/DISCECTOMY FUSION 1 LEVEL;  Surgeon: Venetia Night, MD;  Location: [facility] ORS;  Service: Neurosurgery;  Laterality: N/A;  C4-5   CESAREAN SECTION  1982   x 1   COLONOSCOPY WITH PROPOFOL N/A 03/12/2022   Procedure: COLONOSCOPY WITH PROPOFOL;  Surgeon: Toney Reil, MD;  Location: [facility] ENDOSCOPY;  Service: Gastroenterology;  Laterality: N/A;   TOTAL HIP ARTHROPLASTY Left 05/12/2018   Procedure: TOTAL HIP ARTHROPLASTY ANTERIOR APPROACH;  Surgeon: Kennedy Bucker, MD;  Location: [facility] ORS;  Service: Orthopedics;  Laterality: Left;   TOTAL KNEE ARTHROPLASTY Right 01/28/2021   Procedure: TOTAL KNEE ARTHROPLASTY;  Surgeon: Kennedy Bucker, MD;  Location: [facility] ORS;  Service: Orthopedics;  Laterality: Right;    Medications Prior to Admission  Medication Sig Dispense Refill Last Dose   amLODipine (NORVASC) 10 MG tablet Take 10 mg by mouth in the morning.   06/08/2022   cholecalciferol (VITAMIN D) 25 MCG (1000 UNIT) tablet Take 2,000 Units by mouth in the morning.   06/08/2022   folic acid (FOLVITE) 1 MG tablet Take 1 mg by mouth in the morning.   06/08/2022   gabapentin (NEURONTIN) 300 MG capsule Take 300 mg by mouth 3 (three) times daily as needed (nerve pain/nerve damage).   06/08/2022   losartan (COZAAR) 100 MG tablet Take 100 mg by mouth in the morning.   06/08/2022   SYNTHROID 88 MCG tablet Take 88 mcg by mouth daily before  breakfast.   06/08/2022   VICTOZA 18 MG/3ML SOPN Inject into the skin.   Past Week   enoxaparin (LOVENOX) 40 MG/0.4ML injection Inject 0.4 mLs (40 mg total) into the skin daily for 14 days. 5.6 mL 0    furosemide (LASIX) 20 MG tablet Take 20 mg by mouth as needed. (Patient not taking: Reported on 06/09/2022)   Not Taking   ibuprofen (ADVIL) 800 MG tablet Take 800 mg by mouth 3 (three) times daily.   prn   methotrexate (RHEUMATREX) 2.5 MG tablet Take 15 mg by mouth every Sunday.   06/07/2022   PREMARIN vaginal cream SMARTSIG:1 Vaginal Daily   prn   WEGOVY  0.5 MG/0.5ML SOAJ Inject 0.5 mg into the skin once a week. (Patient not taking: Reported on 06/09/2022)   Not Taking   Social History   Socioeconomic History   Marital status: Single    Spouse name: Not on file   Number of children: 2   Years of education: 12   Highest education level: High school graduate  Occupational History    Employer: [employer]  Tobacco Use   Smoking status: Former    Packs/day: 0.25    Types: Cigarettes    Quit date: 2014    Years since quitting: 9.6   Smokeless tobacco: Never   Tobacco comments:    SMOKED LESS THAN A YEAR   Vaping Use   Vaping Use: Never used  Substance and Sexual Activity   Alcohol use: Yes    Comment: OCC WINE   Drug use: Never   Sexual activity: Yes    Birth control/protection: Post-menopausal  Other Topics Concern   Not on file  Social History Narrative   Not on file   Social Determinants of Health   Financial Resource Strain: Not on file  Food Insecurity: Not on file  Transportation Needs: Not on file  Physical Activity: Not on file  Stress: Not on file  Social Connections: Not on file  Intimate Partner Violence: Not on file    Family History  Problem Relation Age of Onset   Breast cancer Neg Hx       PHYSICAL EXAM General: Pleasant ill-appearing black female, well nourished, in no acute distress.  Sitting upright in recliner with daughter at bedside. HEENT:  Normocephalic and atraumatic. Neck:  No JVD.  Lungs: Normal respiratory effort on room air much.  Clear to auscultation bilaterally.   Heart: Tachycardic irregularly irregular rhythm.   S1-S2 present without appreciable murmurs  abdomen: Obese appearing.  Msk: Normal strength and tone for age. Extremities: Warm and well perfused. No clubbing, cyanosis.  No peripheral edema. Neuro: Alert and oriented X 3. Psych:  Answers questions appropriately.   Labs: Basic Metabolic Panel: Recent Labs    06/11/22 0708 06/12/22 0446  NA 139 140  K  4.6 4.1  CL 111 113*  CO2 22 23  GLUCOSE 170* 118*  BUN 13 19  CREATININE 0.66 0.78  CALCIUM 9.0 9.0  MG 2.3  --     Liver Function Tests: No results for input(s): "AST", "ALT", "ALKPHOS", "BILITOT", "PROT", "ALBUMIN" in the last 72 hours.  No results for input(s): "LIPASE", "AMYLASE" in the last 72 hours. CBC: Recent Labs    06/11/22 0708 06/12/22 0446  WBC 13.7* 16.5*  HGB 12.2 11.6*  HCT 37.2 35.6*  MCV 94.9 93.7  PLT 243 262    Cardiac Enzymes: No results for input(s): "CKTOTAL", "CKMB", "CKMBINDEX", "TROPONINIHS" in the last 72  hours.  BNP: Invalid input(s): "POCBNP" D-Dimer: No results for input(s): "DDIMER" in the last 72 hours. Hemoglobin A1C: No results for input(s): "HGBA1C" in the last 72 hours.  Fasting Lipid Panel: No results for input(s): "CHOL", "HDL", "LDLCALC", "TRIG", "CHOLHDL", "LDLDIRECT" in the last 72 hours. Thyroid Function Tests: No results for input(s): "TSH", "T4TOTAL", "T3FREE", "THYROIDAB" in the last 72 hours.  Invalid input(s): "FREET3"  Anemia Panel: No results for input(s): "VITAMINB12", "FOLATE", "FERRITIN", "TIBC", "IRON", "RETICCTPCT" in the last 72 hours.  No results found.   Radiology: ECHOCARDIOGRAM COMPLETE  Result Date: 06/10/2022    ECHOCARDIOGRAM REPORT   Patient Name:   JEANAE WHITMILL Date of Exam: 06/09/2022 Medical Rec #:  [id number]          Height:       64.0 in Accession #:    [id number]         Weight:       241.6 lb Date of Birth:  09-15-1961          BSA:          2.120 m Patient Age:    61 years           BP:           125/73 mmHg Patient Gender: F                  HR:           99 bpm. Exam Location:  [facility] Procedure: 2D Echo, Cardiac Doppler and Color Doppler Indications:     I48.91 Atrial Fibrillation  History:         Patient has prior history of Echocardiogram examinations, most                  recent 12/06/2019. CHF; Risk Factors:Former Smoker, Hypertension                  and Sleep Apnea.  Sonographer:      Daphine Deutscher RDCS Referring Phys:  [id number] Tonna Corner MICHELLE TANG Diagnosing Phys: Alwyn Pea MD IMPRESSIONS  1. Left ventricular ejection fraction, by estimation, is 40 to 45%. The left ventricle has mildly decreased function. The left ventricle demonstrates global hypokinesis. The left ventricular internal cavity size was moderately dilated. Left ventricular diastolic function could not be evaluated.  2. Right ventricular systolic function is low normal. The right ventricular size is mildly enlarged.  3. Left atrial size was mildly dilated.  4. Right atrial size was mildly dilated.  5. The mitral valve is normal in structure. Trivial mitral valve regurgitation.  6. The aortic valve is normal in structure. Aortic valve regurgitation is not visualized. FINDINGS  Left Ventricle: Left ventricular ejection fraction, by estimation, is 40 to 45%. The left ventricle has mildly decreased function. The left ventricle demonstrates global hypokinesis. Definity contrast agent was given IV to delineate the left ventricular  endocardial borders. The left ventricular internal cavity size was moderately dilated. There is no left ventricular hypertrophy. Left ventricular diastolic function could not be evaluated. Right Ventricle: The right ventricular size is mildly enlarged. No increase in right ventricular wall thickness. Right ventricular systolic function is low normal. Left Atrium: Left atrial size was mildly dilated. Right Atrium: Right atrial size was mildly dilated. Pericardium: There is no evidence of pericardial effusion. Mitral Valve: The mitral valve is normal in structure. Trivial mitral valve regurgitation. Tricuspid Valve: The tricuspid valve is normal in structure. Tricuspid valve regurgitation is mild. Aortic Valve:  The aortic valve is normal in structure. Aortic valve regurgitation is not visualized. Pulmonic Valve: The pulmonic valve was normal in structure. Pulmonic valve regurgitation is not  visualized. Aorta: The ascending aorta was not well visualized. IAS/Shunts: No atrial level shunt detected by color flow Doppler.  LEFT VENTRICLE PLAX 2D LVIDd:         5.70 cm LVIDs:         4.40 cm LV PW:         1.10 cm LV IVS:        1.10 cm  RIGHT VENTRICLE            IVC RV Basal diam:  4.60 cm    IVC diam: 2.30 cm RV S prime:     9.91 cm/s TAPSE (M-mode): 2.6 cm LEFT ATRIUM              Index        RIGHT ATRIUM           Index LA diam:        4.60 cm  2.17 cm/m   RA Area:     15.00 cm LA Vol (A2C):   111.0 ml 52.36 ml/m  RA Volume:   43.00 ml  20.28 ml/m LA Vol (A4C):   54.7 ml  25.80 ml/m LA Biplane Vol: 84.9 ml  40.05 ml/m   AORTA Ao Root diam: 3.00 cm MV E velocity: 122.00 cm/s  TRICUSPID VALVE                             TR Peak grad:   9.5 mmHg                             TR Vmax:        154.00 cm/s Alwyn Pea MD Electronically signed by Alwyn Pea MD Signature Date/Time: 06/10/2022/8:20:24 AM    Final    CT Angio Chest PE W and/or Wo Contrast  Result Date: 06/08/2022 CLINICAL DATA:  Shortness of breath and nausea. 80% O2 sats at room air. EXAM: CT ANGIOGRAPHY CHEST WITH CONTRAST TECHNIQUE: Multidetector CT imaging of the chest was performed using the standard protocol during bolus administration of intravenous contrast. Multiplanar CT image reconstructions and MIPs were obtained to evaluate the vascular anatomy. RADIATION DOSE REDUCTION: This exam was performed according to the departmental dose-optimization program which includes automated exposure control, adjustment of the mA and/or kV according to patient size and/or use of iterative reconstruction technique. CONTRAST:  75mL OMNIPAQUE IOHEXOL 350 MG/ML SOLN COMPARISON:  Chest radiograph performed earlier on the same date FINDINGS: Cardiovascular: Satisfactory opacification of the pulmonary arteries to the segmental level. No evidence of pulmonary embolism. Normal heart size. No pericardial effusion. Mediastinum/Nodes: No  enlarged mediastinal, hilar, or axillary lymph nodes. Thyroid gland, trachea, and esophagus demonstrate no significant findings. Lungs/Pleura: Confluent lung opacities in the upper and lower lobes of the left lung as well as patchy lung opacities in the right lower and dependent portion of the right upper lobe consistent with bilateral pneumonia. Small bilateral pleural effusions, left greater than the right. Azygous right pulmonary lobe. Upper Abdomen: No acute abnormality. Musculoskeletal: No chest wall abnormality. No acute or significant osseous findings. Review of the MIP images confirms the above findings. IMPRESSION: 1.  No evidence of pulmonary embolism. 2. Confluent multifocal opacities in the left upper and lower lobes as well  as scattered patchy lung opacities in the right upper and lower lobes consistent with multilobar pneumonia, left worse than the right. Follow-up examination to resolution is recommended. 2.  Small bilateral pleural effusions, left greater than the right. Electronically Signed   By: Larose Hires D.O.   On: 06/08/2022 16:11   DG Chest 2 View  Result Date: 06/08/2022 CLINICAL DATA:  Short of breath EXAM: CHEST - 2 VIEW COMPARISON:  04/04/2020 FINDINGS: Physis are present in the left lung with relative sparing of the apex. Patchy density is also present at the right lung base. Trace pleural effusions. Top-normal heart size. No pneumothorax. IMPRESSION: Pulmonary opacities primarily on the left suspicious for pneumonia. Electronically Signed   By: Guadlupe Spanish M.D.   On: 06/08/2022 12:17    ECHO 12/06/2019 1. Left ventricular ejection fraction, by estimation, is 55 to 60%. The left ventricle has normal function. The left ventricle has no regional wall motion abnormalities. Left ventricular diastolic function could not be evaluated. 2. Right ventricular systolic function is normal. The right ventricular size is normal. There is normal pulmonary artery systolic pressure. 3. The  mitral valve is normal in structure and function. No evidence of mitral valve regurgitation. 4. The aortic valve is grossly normal. Aortic valve regurgitation is not visualized. 5. Pulmonic valve regurgitation not assessed. 6. The inferior vena cava is normal in size with greater than 50% respiratory variability, suggesting right atrial pressure of 3 mmHg.  TELEMETRY reviewed by me (LT) 06/12/2022 : Atrial fibrillation rates 100s to 110s, increased to the 120s with activity.  EKG reviewed by me: Atrial fibrillation rate 116  Data reviewed by me (LT) 06/12/2022: Hospitalist note, PT note, CBC, BMP, vitals, telemetry  ASSESSMENT AND PLAN:  Alexandria Moore is a 61yoF with a PMH of hypertension, RA on methotrexate, GERD, hypothyroidism, OSA not on CPAP after losing weight who presented to [facility] ED 06/08/2022 with shortness of breath, was hypoxic to the 80s on room air, CTA chest negative for PE but showed multilobar pneumonia.  Cardiology is consulted for assistance with her new onset A-fib with RVR.  Echo this admission also showed a moderately reduced EF 40 to 45%.  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  #New onset paroxysmal atrial fibrillation with RVR #Hypertension Developed on admission in the setting of hypoxia and multifocal pneumonia.  Heart rates initially in the 120s to 130s, much improved after diltiazem drip to interview the afternoon of 8/29 with rates mostly in the 80s to 90s.  We will continue rate control strategy in setting of pneumonia and starting anticoagulation. -S/p IV diltiazem 10 mg x 1, 15 mg x 1, started on diltiazem gtt. around 1830 on 8/28. -s/p diltiazem gtt.  -S/p IV  metoprolol to 2.5 mg x 1 on 8/30 -Consolidate to metoprolol tartrate 100mg  twice daily -We will add as needed diltiazem 30 mg every 6 hours for sustained heart rate greater than 130 for 15 minutes -Continue Eliquis 5 mg twice daily for stroke risk reduction.  CHA2DS2-VASc 2 (sex, htn) -Hold home amlodipine in favor of other medicines as above -Telemetry monitoring -Risk factors: A1c 5.5, TSH okay at 4.5 -Consideration for DCCV: Defer at this time due to ongoing pneumonia, oxygen requirement. Will consider after at least  3 weeks of anticoagulation after outpatient follow-up. -She is okay for discharge from a cardiac standpoint today.  Can follow-up with Dr. Juliann Pares in 1 to 2 weeks.  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  BNP 529, not very volume overloaded loaded appearing, but respiratory status improving after IV Lasix yesterday.   -GDMT metoprolol 100 twice daily, continue spironolactone 12.5 mg once daily  -Will consider addition of Jardiance on an outpatient basis, notably, the patient states she was taking losartan on an outpatient basis even with her history of tongue swelling with lisinopril.  Recommend holding losartan for now if not discontinuing this altogether with the ACEi allergy. -s/p IV Lasix 20 mg x 1, and 40 mg IV x 2 8/31  This patient's plan of care was discussed and created with Dr. Juliann Pares and he is in agreement.  Signed: Rebeca Allegra , PA-C 06/12/2022, 8:03 AM [facility] Cardiology

## 2022-06-12 NOTE — Discharge Summary (Signed)
Physician Discharge Summary   Patient: Alexandria Moore MRN: [id number] DOB: April 11, 1961  Admit date:     06/08/2022  Discharge date: 06/12/22  Discharge Physician: Alford Highland   PCP: Sandrea Hughs, NP   Recommendations at discharge:   Follow-up PCP 5 days Follow-up cardiology 1 week.  Discharge Diagnoses: Principal Problem:   Acute respiratory failure with hypoxia (HCC) Active Problems:   Multifocal pneumonia   Paroxysmal atrial fibrillation with RVR (HCC)   RA (rheumatoid arthritis) (HCC)   Essential hypertension   BMI 40.0-44.9, adult (HCC)   Immunosuppressed status (HCC)   Hypothyroidism, unspecified   Hospital Course: The patient was admitted to the hospital on 06/08/2022 and discharged on 06/12/2022.  Patient came in with shortness of breath and have multifocal pneumonia and acute hypoxic respiratory failure requiring oxygen.  CT scan of the chest negative for PE but did show multifocal pneumonia.  She is also found to be an atrial fibrillation.  Patient was admitted to the stepdown unit and started on Cardizem drip.  He was started on Lasix Rocephin and Zithromax.  Influenza, COVID and viral respiratory panel negative.  The patient did not start to improve until I started steroids on 06/10/2022.  The patient was able to come off oxygen completely.  The patient's heart rate remained elevated during the hospital course.  Upon discharge the patient was able to ambulate and heart rate increased into the 120s with activity.  Cardiology was okay with metoprolol 100 mg twice a day with as needed Cardizem for increased heart rate.  Echocardiogram did show an EF of 40 to 45% and cardiology added low-dose spironolactone.  The patient completed 5 days of antibiotics during the hospital course.  We will give a prednisone taper upon disposition.  Assessment and Plan: * Acute respiratory failure with hypoxia (HCC) Pulse ox of 80% on presentation requiring BiPAP initially.  Patient on 4 L of  oxygen this morning.  Patient walked around with physical therapy and was able to come off oxygen as of 06/11/2022  Multifocal pneumonia Patient completed 5 days of Rocephin and Zithromax.  Improved respiratory status after starting Solu-Medrol on 06/10/2022.  Prednisone taper given upon disposition.  Paroxysmal atrial fibrillation with RVR (HCC) Eliquis for anticoagulation.  With walking heart rate up into the 120s low 130s today.  Case discussed with cardiology and they changed the metoprolol to 100 mg twice a day and recommended as needed Cardizem for fast heart rates.  Follow-up with cardiology as outpatient.  RA (rheumatoid arthritis) (HCC) Patient on methotrexate and folic acid  Hypothyroidism, unspecified On levothyroxine  Immunosuppressed status (HCC) Secondary to rheumatoid arthritis and methotrexate  BMI 40.0-44.9, adult (HCC) BMI 42.38  Essential hypertension Continue metoprolol         Consultants: Cardiology Procedures performed: None Disposition: Home Diet recommendation:  Cardiac diet DISCHARGE MEDICATION: Allergies as of 06/12/2022       Reactions   Hydromorphone Anaphylaxis, Shortness Of Breath   Lisinopril Anaphylaxis, Nausea And Vomiting, Swelling, Other (See Comments)   LIP SWELLING   Meloxicam Anaphylaxis, Other (See Comments)   Weakness in Legs Other reaction(s): Other (See Comments) Weakness in Legs Weakness in Legs   Adhesive [tape] Other (See Comments)   Irritates skin. (band-aids) Paper tape is okay        Medication List     STOP taking these medications    amLODipine 10 MG tablet Commonly known as: NORVASC   enoxaparin 40 MG/0.4ML injection Commonly known as: Lovenox   furosemide  20 MG tablet Commonly known as: LASIX   ibuprofen 800 MG tablet Commonly known as: ADVIL   Wegovy 0.5 MG/0.5ML Soaj Generic drug: Semaglutide-Weight Management       TAKE these medications    apixaban 5 MG Tabs tablet Commonly known as:  ELIQUIS Take 1 tablet (5 mg total) by mouth 2 (two) times daily.   cholecalciferol 25 MCG (1000 UNIT) tablet Commonly known as: VITAMIN D3 Take 2,000 Units by mouth in the morning.   diltiazem 30 MG tablet Commonly known as: CARDIZEM Take 1 tablet (30 mg total) by mouth every 6 (six) hours as needed (for sustained HR >130 for more than 15 mins. do not take if systolic blood pressure (top number) is lower than 100.).   folic acid 1 MG tablet Commonly known as: FOLVITE Take 1 mg by mouth in the morning.   gabapentin 300 MG capsule Commonly known as: NEURONTIN Take 300 mg by mouth 3 (three) times daily as needed (nerve pain/nerve damage).   losartan 100 MG tablet Commonly known as: COZAAR Take 100 mg by mouth in the morning.   methotrexate 2.5 MG tablet Commonly known as: RHEUMATREX Take 15 mg by mouth every Sunday.   metoprolol tartrate 100 MG tablet Commonly known as: LOPRESSOR Take 1 tablet (100 mg total) by mouth 2 (two) times daily.   polyethylene glycol 17 g packet Commonly known as: MIRALAX / GLYCOLAX Take 17 g by mouth daily as needed for moderate constipation.   predniSONE 10 MG tablet Commonly known as: DELTASONE 3 tabs po day1; 2 tabs po day2; 1 tab po day3,4; 1/2 tab po day5,6 Start taking on: June 13, 2022   Premarin vaginal cream Generic drug: conjugated estrogens SMARTSIG:1 Vaginal Daily   spironolactone 25 MG tablet Commonly known as: ALDACTONE Take 0.5 tablets (12.5 mg total) by mouth daily.   Synthroid 88 MCG tablet Generic drug: levothyroxine Take 88 mcg by mouth daily before breakfast.   Victoza 18 MG/3ML Sopn Generic drug: liraglutide Inject into the skin.        Follow-up Information     Alwyn Pea, MD. Go in 1 week(s).   Specialties: Cardiology, Internal Medicine Why: Appointment on Wednesday, 06/17/2022 at 8:30am. Contact information: [address] [place] [address] [phone]                 Discharge Exam: Ceasar Mons Weights   06/10/22 0444 06/11/22 0457 06/12/22 0456  Weight: 115 kg 113 kg 112 kg   Physical Exam HENT:     Head: Normocephalic.     Mouth/Throat:     Pharynx: No oropharyngeal exudate.  Eyes:     General: Lids are normal.     Conjunctiva/sclera: Conjunctivae normal.  Cardiovascular:     Rate and Rhythm: Tachycardia present. Rhythm irregularly irregular.     Heart sounds: Normal heart sounds, S1 normal and S2 normal.  Pulmonary:     Breath sounds: Examination of the right-lower field reveals decreased breath sounds. Examination of the left-lower field reveals decreased breath sounds. Decreased breath sounds present. No wheezing, rhonchi or rales.  Abdominal:     Palpations: Abdomen is soft.     Tenderness: There is no abdominal tenderness.  Musculoskeletal:     Right lower leg: Swelling present.     Left lower leg: Swelling present.  Skin:    General: Skin is warm.     Findings: No rash.  Neurological:     Mental Status: She is alert and oriented to  person, place, and time.      Condition at discharge: stable  The results of significant diagnostics from this hospitalization (including imaging, microbiology, ancillary and laboratory) are listed below for reference.   Imaging Studies: ECHOCARDIOGRAM COMPLETE  Result Date: 06/10/2022    ECHOCARDIOGRAM REPORT   Patient Name:   ELLA WARGEL Date of Exam: 06/09/2022 Medical Rec #:  [id number]          Height:       64.0 in Accession #:    [id number]         Weight:       241.6 lb Date of Birth:  1961/04/17          BSA:          2.120 m Patient Age:    61 years           BP:           125/73 mmHg Patient Gender: F                  HR:           99 bpm. Exam Location:  [facility] Procedure: 2D Echo, Cardiac Doppler and Color Doppler Indications:     I48.91 Atrial Fibrillation  History:         Patient has prior history of Echocardiogram examinations, most                  recent 12/06/2019. CHF; Risk  Factors:Former Smoker, Hypertension                  and Sleep Apnea.  Sonographer:     Daphine Deutscher RDCS Referring Phys:  [id number] Tonna Corner MICHELLE TANG Diagnosing Phys: Alwyn Pea MD IMPRESSIONS  1. Left ventricular ejection fraction, by estimation, is 40 to 45%. The left ventricle has mildly decreased function. The left ventricle demonstrates global hypokinesis. The left ventricular internal cavity size was moderately dilated. Left ventricular diastolic function could not be evaluated.  2. Right ventricular systolic function is low normal. The right ventricular size is mildly enlarged.  3. Left atrial size was mildly dilated.  4. Right atrial size was mildly dilated.  5. The mitral valve is normal in structure. Trivial mitral valve regurgitation.  6. The aortic valve is normal in structure. Aortic valve regurgitation is not visualized. FINDINGS  Left Ventricle: Left ventricular ejection fraction, by estimation, is 40 to 45%. The left ventricle has mildly decreased function. The left ventricle demonstrates global hypokinesis. Definity contrast agent was given IV to delineate the left ventricular  endocardial borders. The left ventricular internal cavity size was moderately dilated. There is no left ventricular hypertrophy. Left ventricular diastolic function could not be evaluated. Right Ventricle: The right ventricular size is mildly enlarged. No increase in right ventricular wall thickness. Right ventricular systolic function is low normal. Left Atrium: Left atrial size was mildly dilated. Right Atrium: Right atrial size was mildly dilated. Pericardium: There is no evidence of pericardial effusion. Mitral Valve: The mitral valve is normal in structure. Trivial mitral valve regurgitation. Tricuspid Valve: The tricuspid valve is normal in structure. Tricuspid valve regurgitation is mild. Aortic Valve: The aortic valve is normal in structure. Aortic valve regurgitation is not visualized. Pulmonic  Valve: The pulmonic valve was normal in structure. Pulmonic valve regurgitation is not visualized. Aorta: The ascending aorta was not well visualized. IAS/Shunts: No atrial level shunt detected by color flow Doppler.  LEFT VENTRICLE PLAX 2D LVIDd:  5.70 cm LVIDs:         4.40 cm LV PW:         1.10 cm LV IVS:        1.10 cm  RIGHT VENTRICLE            IVC RV Basal diam:  4.60 cm    IVC diam: 2.30 cm RV S prime:     9.91 cm/s TAPSE (M-mode): 2.6 cm LEFT ATRIUM              Index        RIGHT ATRIUM           Index LA diam:        4.60 cm  2.17 cm/m   RA Area:     15.00 cm LA Vol (A2C):   111.0 ml 52.36 ml/m  RA Volume:   43.00 ml  20.28 ml/m LA Vol (A4C):   54.7 ml  25.80 ml/m LA Biplane Vol: 84.9 ml  40.05 ml/m   AORTA Ao Root diam: 3.00 cm MV E velocity: 122.00 cm/s  TRICUSPID VALVE                             TR Peak grad:   9.5 mmHg                             TR Vmax:        154.00 cm/s Alwyn Pea MD Electronically signed by Alwyn Pea MD Signature Date/Time: 06/10/2022/8:20:24 AM    Final    CT Angio Chest PE W and/or Wo Contrast  Result Date: 06/08/2022 CLINICAL DATA:  Shortness of breath and nausea. 80% O2 sats at room air. EXAM: CT ANGIOGRAPHY CHEST WITH CONTRAST TECHNIQUE: Multidetector CT imaging of the chest was performed using the standard protocol during bolus administration of intravenous contrast. Multiplanar CT image reconstructions and MIPs were obtained to evaluate the vascular anatomy. RADIATION DOSE REDUCTION: This exam was performed according to the departmental dose-optimization program which includes automated exposure control, adjustment of the mA and/or kV according to patient size and/or use of iterative reconstruction technique. CONTRAST:  3mL OMNIPAQUE IOHEXOL 350 MG/ML SOLN COMPARISON:  Chest radiograph performed earlier on the same date FINDINGS: Cardiovascular: Satisfactory opacification of the pulmonary arteries to the segmental level. No evidence of  pulmonary embolism. Normal heart size. No pericardial effusion. Mediastinum/Nodes: No enlarged mediastinal, hilar, or axillary lymph nodes. Thyroid gland, trachea, and esophagus demonstrate no significant findings. Lungs/Pleura: Confluent lung opacities in the upper and lower lobes of the left lung as well as patchy lung opacities in the right lower and dependent portion of the right upper lobe consistent with bilateral pneumonia. Small bilateral pleural effusions, left greater than the right. Azygous right pulmonary lobe. Upper Abdomen: No acute abnormality. Musculoskeletal: No chest wall abnormality. No acute or significant osseous findings. Review of the MIP images confirms the above findings. IMPRESSION: 1.  No evidence of pulmonary embolism. 2. Confluent multifocal opacities in the left upper and lower lobes as well as scattered patchy lung opacities in the right upper and lower lobes consistent with multilobar pneumonia, left worse than the right. Follow-up examination to resolution is recommended. 2.  Small bilateral pleural effusions, left greater than the right. Electronically Signed   By: Larose Hires D.O.   On: 06/08/2022 16:11   DG Chest 2 View  Result Date:  06/08/2022 CLINICAL DATA:  Short of breath EXAM: CHEST - 2 VIEW COMPARISON:  04/04/2020 FINDINGS: Physis are present in the left lung with relative sparing of the apex. Patchy density is also present at the right lung base. Trace pleural effusions. Top-normal heart size. No pneumothorax. IMPRESSION: Pulmonary opacities primarily on the left suspicious for pneumonia. Electronically Signed   By: Guadlupe Spanish M.D.   On: 06/08/2022 12:17    Microbiology: Results for orders placed or performed during the hospital encounter of 06/08/22  Blood Culture (routine x 2)     Status: None (Preliminary result)   Collection Time: 06/08/22  3:05 PM   Specimen: BLOOD  Result Value Ref Range Status   Specimen Description BLOOD LEFT ANTECUBITAL  Final    Special Requests   Final    BOTTLES DRAWN AEROBIC AND ANAEROBIC Blood Culture results may not be optimal due to an inadequate volume of blood received in culture bottles   Culture   Final    NO GROWTH 4 DAYS Performed at [facility], [address], [place], [place] [address]    Report Status PENDING  Incomplete  Blood Culture (routine x 2)     Status: None (Preliminary result)   Collection Time: 06/08/22  3:30 PM   Specimen: BLOOD  Result Value Ref Range Status   Specimen Description BLOOD RIGHT ANTECUBITAL  Final   Special Requests   Final    BOTTLES DRAWN AEROBIC AND ANAEROBIC Blood Culture results may not be optimal due to an inadequate volume of blood received in culture bottles   Culture   Final    NO GROWTH 4 DAYS Performed at [facility], [address] [place], [place] [address]    Report Status PENDING  Incomplete  Resp Panel by RT-PCR (Flu A&B, Covid) Anterior Nasal Swab     Status: None   Collection Time: 06/08/22  3:50 PM   Specimen: Anterior Nasal Swab  Result Value Ref Range Status   SARS Coronavirus 2 by RT PCR NEGATIVE NEGATIVE Final    Comment: (NOTE) SARS-CoV-2 target nucleic acids are NOT DETECTED.  The SARS-CoV-2 RNA is generally detectable in upper respiratory specimens during the acute phase of infection. The lowest concentration of SARS-CoV-2 viral copies this assay can detect is 138 copies/mL. A negative result does not preclude SARS-Cov-2 infection and should not be used as the sole basis for treatment or other patient management decisions. A negative result may occur with  improper specimen collection/handling, submission of specimen other than nasopharyngeal swab, presence of viral mutation(s) within the areas targeted by this assay, and inadequate number of viral copies(<138 copies/mL). A negative result must be combined with clinical observations, patient history, and epidemiological information. The expected result is  Negative.  Fact Sheet for Patients:  BloggerCourse.com  Fact Sheet for Healthcare Providers:  SeriousBroker.it  This test is no t yet approved or cleared by the [place] FDA and  has been authorized for detection and/or diagnosis of SARS-CoV-2 by FDA under an Emergency Use Authorization (EUA). This EUA will remain  in effect (meaning this test can be used) for the duration of the COVID-19 declaration under Section 564(b)(1) of the Act, 21 U.S.C.section 360bbb-3(b)(1), unless the authorization is terminated  or revoked sooner.       Influenza A by PCR NEGATIVE NEGATIVE Final   Influenza B by PCR NEGATIVE NEGATIVE Final    Comment: (NOTE) The Xpert Xpress SARS-CoV-2/FLU/RSV plus assay is intended as an aid in the diagnosis  of influenza from Nasopharyngeal swab specimens and should not be used as a sole basis for treatment. Nasal washings and aspirates are unacceptable for Xpert Xpress SARS-CoV-2/FLU/RSV testing.  Fact Sheet for Patients: BloggerCourse.com  Fact Sheet for Healthcare Providers: SeriousBroker.it  This test is not yet approved or cleared by the [place] FDA and has been authorized for detection and/or diagnosis of SARS-CoV-2 by FDA under an Emergency Use Authorization (EUA). This EUA will remain in effect (meaning this test can be used) for the duration of the COVID-19 declaration under Section 564(b)(1) of the Act, 21 U.S.C. section 360bbb-3(b)(1), unless the authorization is terminated or revoked.  Performed at [facility], [address] [place], [place] [address]   MRSA Next Gen by PCR, Nasal     Status: None   Collection Time: 06/08/22  8:50 PM   Specimen: Nasal Mucosa; Nasal Swab  Result Value Ref Range Status   MRSA by PCR Next Gen NOT DETECTED NOT DETECTED Final    Comment: (NOTE) The GeneXpert MRSA Assay (FDA approved for NASAL  specimens only), is one component of a comprehensive MRSA colonization surveillance program. It is not intended to diagnose MRSA infection nor to guide or monitor treatment for MRSA infections. Test performance is not FDA approved in patients less than 63 years old. Performed at [facility], [address] [place], [place] [address]   Respiratory (~20 pathogens) panel by PCR     Status: None   Collection Time: 06/10/22  8:44 AM   Specimen: Nasopharyngeal Swab; Respiratory  Result Value Ref Range Status   Adenovirus NOT DETECTED NOT DETECTED Final   Coronavirus 229E NOT DETECTED NOT DETECTED Final    Comment: (NOTE) The Coronavirus on the Respiratory Panel, DOES NOT test for the novel  Coronavirus (2019 nCoV)    Coronavirus HKU1 NOT DETECTED NOT DETECTED Final   Coronavirus NL63 NOT DETECTED NOT DETECTED Final   Coronavirus OC43 NOT DETECTED NOT DETECTED Final   Metapneumovirus NOT DETECTED NOT DETECTED Final   Rhinovirus / Enterovirus NOT DETECTED NOT DETECTED Final   Influenza A NOT DETECTED NOT DETECTED Final   Influenza B NOT DETECTED NOT DETECTED Final   Parainfluenza Virus 1 NOT DETECTED NOT DETECTED Final   Parainfluenza Virus 2 NOT DETECTED NOT DETECTED Final   Parainfluenza Virus 3 NOT DETECTED NOT DETECTED Final   Parainfluenza Virus 4 NOT DETECTED NOT DETECTED Final   Respiratory Syncytial Virus NOT DETECTED NOT DETECTED Final   Bordetella pertussis NOT DETECTED NOT DETECTED Final   Bordetella Parapertussis NOT DETECTED NOT DETECTED Final   Chlamydophila pneumoniae NOT DETECTED NOT DETECTED Final   Mycoplasma pneumoniae NOT DETECTED NOT DETECTED Final    Comment: Performed at [facility], [address], [place], [place] [address]    Labs: CBC: Recent Labs  Lab 06/08/22 1134 06/09/22 0036 06/10/22 0836 06/11/22 0708 06/12/22 0446  WBC 14.9* 14.6* 11.1* 13.7* 16.5*  HGB 12.9 12.1 11.4* 12.2 11.6*  HCT 39.2 37.1 35.1* 37.2 35.6*  MCV 94.0  94.9 96.7 94.9 93.7  PLT 247 208 188 243 262   Basic Metabolic Panel: Recent Labs  Lab 06/08/22 1134 06/09/22 0036 06/10/22 0836 06/11/22 0708 06/12/22 0446  NA 137 137 138 139 140  K 4.1 3.6 3.7 4.6 4.1  CL 104 108 111 111 113*  CO2 24 23 23 22 23   GLUCOSE 119* 113* 133* 170* 118*  BUN 13 9 8 13 19   CREATININE 0.69 0.65 0.69 0.66 0.78  CALCIUM 8.9 8.5* 8.5* 9.0 9.0  MG  --  1.8  --  2.3  --    Liver Function Tests: Recent Labs  Lab 06/09/22 0036  AST 16  ALT 22  ALKPHOS 62  BILITOT 2.3*  PROT 6.3*  ALBUMIN 3.4*   CBG: Recent Labs  Lab 06/10/22 0745 06/11/22 0811 06/11/22 1114 06/11/22 1638 06/12/22 0751  GLUCAP 151* 130* 134* 151* 138*    Discharge time spent: greater than 30 minutes.  Signed: Alford Highland, MD [facility] 06/12/2022

## 2022-06-13 LAB — CULTURE, BLOOD (ROUTINE X 2)
Culture: NO GROWTH
Culture: NO GROWTH

## 2022-06-16 ENCOUNTER — Ambulatory Visit: Payer: 59 | Admitting: Orthopedic Surgery

## 2022-06-17 ENCOUNTER — Encounter: Payer: Self-pay | Admitting: Family

## 2022-06-17 ENCOUNTER — Ambulatory Visit: Payer: 59 | Attending: Family | Admitting: Family

## 2022-06-17 VITALS — BP 107/57 | HR 79 | Resp 18 | Ht 64.0 in | Wt 236.0 lb

## 2022-06-17 DIAGNOSIS — I10 Essential (primary) hypertension: Secondary | ICD-10-CM | POA: Diagnosis not present

## 2022-06-17 DIAGNOSIS — R0789 Other chest pain: Secondary | ICD-10-CM | POA: Insufficient documentation

## 2022-06-17 DIAGNOSIS — I5022 Chronic systolic (congestive) heart failure: Secondary | ICD-10-CM | POA: Diagnosis not present

## 2022-06-17 DIAGNOSIS — Z7901 Long term (current) use of anticoagulants: Secondary | ICD-10-CM | POA: Insufficient documentation

## 2022-06-17 DIAGNOSIS — R519 Headache, unspecified: Secondary | ICD-10-CM | POA: Insufficient documentation

## 2022-06-17 DIAGNOSIS — I509 Heart failure, unspecified: Secondary | ICD-10-CM | POA: Insufficient documentation

## 2022-06-17 DIAGNOSIS — Z87891 Personal history of nicotine dependence: Secondary | ICD-10-CM | POA: Diagnosis not present

## 2022-06-17 DIAGNOSIS — R42 Dizziness and giddiness: Secondary | ICD-10-CM | POA: Insufficient documentation

## 2022-06-17 DIAGNOSIS — E119 Type 2 diabetes mellitus without complications: Secondary | ICD-10-CM | POA: Diagnosis not present

## 2022-06-17 DIAGNOSIS — Z8616 Personal history of COVID-19: Secondary | ICD-10-CM | POA: Insufficient documentation

## 2022-06-17 DIAGNOSIS — G4730 Sleep apnea, unspecified: Secondary | ICD-10-CM | POA: Diagnosis not present

## 2022-06-17 DIAGNOSIS — I480 Paroxysmal atrial fibrillation: Secondary | ICD-10-CM | POA: Diagnosis not present

## 2022-06-17 DIAGNOSIS — I110 Hypertensive heart disease with heart failure: Secondary | ICD-10-CM | POA: Diagnosis not present

## 2022-06-17 NOTE — Patient Instructions (Addendum)
Continue weighing daily and call for an overnight weight gain of 3 pounds or more or a weekly weight gain of more than 5 pounds.   Continue to monitor weight and follow a low-sodium diet.   Wear compression stockings and remove at night.

## 2022-06-17 NOTE — Progress Notes (Signed)
Patient ID: Alexandria Moore, female    DOB: [birth year], 61 y.o.   MRN: [id number]  HPI  Alexandria Moore is a 61 y/o female with a history of HTN, PAF, thyroid disease, anxiety, GERD, sleep apnea, tobacco use and chronic heart failure.   ECHO 06/09/22 Left ventricular ejection fraction, by estimation, is 40 to 45%  Admitted 06/08/22 due to Dyspnea and  Community Acquired Pneumonia to left lung. Chest CT negative for PE. Found to be in atrial fibrillation. Cardiology consult obtained. Started on cardizem drip. Antibiotics given along with steroids. Weaned off oxygen. Discharged after 4 days.   She presents as new patient today with a chief complaint of SOB, swelling in left ankle and leg, and fatigue. Patient says her fatigue, headaches, dizziness and intermittent chest pain is chronic.   Monitors weight daily and keeps log. Follows a low-sodium diet and not adding salt to foods. Walking as tolerated and stationary bike. Does not wear compression hoses.  Past Medical History:  Diagnosis Date   Anxiety    Arthritis    RA   Borderline diabetes    CHF (congestive heart failure) ([place])    COVID-19 11/2019   Dyspnea    OFF AND ON SINCE COVID 2021   GERD (gastroesophageal reflux disease)    Goiter    Heart murmur    not being followed or treated. has lost weight with improvement of murmur   History of blood transfusion    unsure of when she had transfusion but states she had a terrible reaction and needed medication   Hypertension    Hyperthyroidism    Hypothyroidism    not on meds at this time   Medical history non-contributory    Pneumonia 11/2019   Sleep apnea    does not use cpap since weight loss   Past Surgical History:  Procedure Laterality Date   ANTERIOR CERVICAL DECOMP/DISCECTOMY FUSION N/A 01/18/2020   Procedure: ANTERIOR CERVICAL DECOMPRESSION/DISCECTOMY FUSION 1 LEVEL;  Surgeon: Meade Maw, MD;  Location: [facility] ORS;  Service: Neurosurgery;  Laterality: N/A;  C4-5    CESAREAN SECTION  1982   x 1   COLONOSCOPY WITH PROPOFOL N/A 03/12/2022   Procedure: COLONOSCOPY WITH PROPOFOL;  Surgeon: Lin Landsman, MD;  Location: [facility] ENDOSCOPY;  Service: Gastroenterology;  Laterality: N/A;   TOTAL HIP ARTHROPLASTY Left 05/12/2018   Procedure: TOTAL HIP ARTHROPLASTY ANTERIOR APPROACH;  Surgeon: Hessie Knows, MD;  Location: [facility] ORS;  Service: Orthopedics;  Laterality: Left;   TOTAL KNEE ARTHROPLASTY Right 01/28/2021   Procedure: TOTAL KNEE ARTHROPLASTY;  Surgeon: Hessie Knows, MD;  Location: [facility] ORS;  Service: Orthopedics;  Laterality: Right;   Family History  Problem Relation Age of Onset   Breast cancer Neg Hx    Social History   Tobacco Use   Smoking status: Former    Packs/day: 0.25    Types: Cigarettes    Quit date: 2014    Years since quitting: 9.6   Smokeless tobacco: Never   Tobacco comments:    SMOKED LESS THAN A YEAR   Substance Use Topics   Alcohol use: Yes    Comment: OCC WINE   Allergies  Allergen Reactions   Hydromorphone Anaphylaxis and Shortness Of Breath   Lisinopril Anaphylaxis, Nausea And Vomiting, Swelling and Other (See Comments)    LIP SWELLING   Meloxicam Anaphylaxis and Other (See Comments)    Weakness in Legs Other reaction(s): Other (See Comments) Weakness in Legs Weakness in Legs    Adhesive [  Tape] Other (See Comments)    Irritates skin. (band-aids) Paper tape is okay   Prior to Admission medications   Medication Sig Start Date End Date Taking? Authorizing Provider  amLODipine (NORVASC) 10 MG tablet Take 10 mg by mouth daily. 06/16/22  Yes [provider]  apixaban (ELIQUIS) 5 MG TABS tablet Take 1 tablet (5 mg total) by mouth 2 (two) times daily. 06/12/22  Yes Wieting, Richard, MD  cholecalciferol (VITAMIN D) 25 MCG (1000 UNIT) tablet Take 2,000 Units by mouth in the morning.   Yes [provider]  diltiazem (CARDIZEM) 30 MG tablet Take 1 tablet (30 mg total) by mouth every 6 (six) hours as needed  (for sustained HR >130 for more than 15 mins. do not take if systolic blood pressure (top number) is lower than 100.). 06/12/22  Yes Wieting, Richard, MD  folic acid (FOLVITE) 1 MG tablet Take 1 mg by mouth in the morning. 01/01/21  Yes [provider]  gabapentin (NEURONTIN) 300 MG capsule Take 300 mg by mouth 3 (three) times daily as needed (nerve pain/nerve damage). 11/06/19  Yes [provider]  losartan (COZAAR) 100 MG tablet Take 100 mg by mouth in the morning.   Yes [provider]  methotrexate (RHEUMATREX) 2.5 MG tablet Take 15 mg by mouth every Sunday. 01/02/21  Yes [provider]  metoprolol tartrate (LOPRESSOR) 100 MG tablet Take 1 tablet (100 mg total) by mouth 2 (two) times daily. 06/12/22  Yes Wieting, Richard, MD  polyethylene glycol (MIRALAX / GLYCOLAX) 17 g packet Take 17 g by mouth daily as needed for moderate constipation. 06/12/22  Yes Wieting, Richard, MD  predniSONE (DELTASONE) 10 MG tablet 3 tabs po day1; 2 tabs po day2; 1 tab po day3,4; 1/2 tab po day5,6 06/13/22  Yes Wieting, Richard, MD  spironolactone (ALDACTONE) 25 MG tablet Take 0.5 tablets (12.5 mg total) by mouth daily. 06/12/22  Yes Loletha Grayer, MD  SYNTHROID 88 MCG tablet Take 88 mcg by mouth daily before breakfast. 06/16/19  Yes [provider]  furosemide (LASIX) 20 MG tablet Take 20 mg by mouth daily.    [provider]  PREMARIN vaginal cream SMARTSIG:1 Vaginal Daily Patient not taking: Reported on 06/17/2022 03/24/22   [provider]  VICTOZA 18 MG/3ML SOPN Inject into the skin. Patient not taking: Reported on 06/17/2022 05/12/22   [provider]    Review of Systems  Constitutional:  Positive for fatigue (chronic). Negative for appetite change.  Eyes:  Positive for visual disturbance.  Respiratory:  Positive for shortness of breath. Negative for cough and wheezing.   Cardiovascular:  Positive for chest pain (intermittent) and leg swelling (left  ankle/leg). Negative for palpitations.  Gastrointestinal:  Negative for abdominal distention, abdominal pain, constipation, diarrhea, nausea and vomiting.  Genitourinary:  Negative for difficulty urinating.  Musculoskeletal:  Positive for arthralgias, back pain (Right side) and joint swelling.  Neurological:  Positive for dizziness, weakness (some in joints and hands) and headaches.  Psychiatric/Behavioral:  Positive for sleep disturbance (three pillows).    Vitals:   06/17/22 1053  BP: (!) 107/57  Pulse: 79  Resp: 18  SpO2: 100%   Wt Readings from Last 3 Encounters:  06/17/22 236 lb (107 kg)  06/12/22 246 lb 14.6 oz (112 kg)  03/12/22 232 lb (105.2 kg)    Lab Results  Component Value Date   CREATININE 0.78 06/12/2022   CREATININE 0.66 06/11/2022   CREATININE 0.69 06/10/2022     Physical Exam  Vitals reviewed.  Cardiovascular:     Rate and Rhythm: Normal rate. Rhythm irregular.     Heart sounds: Normal heart sounds.  Pulmonary:     Effort: Pulmonary effort is normal.     Breath sounds: Normal breath sounds. No wheezing.  Abdominal:     Palpations: Abdomen is soft.  Musculoskeletal:     Right lower leg: No edema.     Left lower leg: 1+ Edema present.  Skin:    General: Skin is warm and dry.  Neurological:     Mental Status: She is alert and oriented to person, place, and time.  Psychiatric:        Mood and Affect: Mood normal.        Behavior: Behavior normal.    Assessment and Plan:  1: Chronic Heart Failure with mildly reduced ejection fraction- - NYHA II - Continue weighing daily and instructed to call for an overnight weight gain of 3 pounds or more or a weekly weight gain of more than 5 pounds.  - Discussed low-sodium diet and not adding salt to foods - Discussed wearing compression stockings daily with removal at bedtime - Will discuss adding SGLT2 next visit  - saw cardiology [facility]) earlier today and started furosemide but patient can't remember the  dose - BNP 06/08/22 529.4  2: A-Fib- - in afib today - taking apixaban and metoprolol; diltiazem is PRN  3: HTN- - BP looks good today(107/57) - BMP 06/12/22 Sodium 140 , Potassium 4.4, Creatinine 0.78 , eGFR >60 - Saw PCP Duanne Moron) 11/26/21  4: DM- - Does not monitor blood sugar and no longer on metformin - A1C 5.5 06/08/22   Medications reviewed with patient and daughter.  Follow up 1 months, sooner if needed

## 2022-06-24 ENCOUNTER — Ambulatory Visit (HOSPITAL_COMMUNITY): Payer: 59 | Admitting: Nurse Practitioner

## 2022-06-25 ENCOUNTER — Ambulatory Visit (HOSPITAL_COMMUNITY): Payer: 59 | Admitting: Nurse Practitioner

## 2022-07-07 ENCOUNTER — Other Ambulatory Visit: Payer: Self-pay | Admitting: Internal Medicine

## 2022-07-07 DIAGNOSIS — I509 Heart failure, unspecified: Secondary | ICD-10-CM

## 2022-07-07 DIAGNOSIS — R0602 Shortness of breath: Secondary | ICD-10-CM

## 2022-07-07 DIAGNOSIS — I208 Other forms of angina pectoris: Secondary | ICD-10-CM

## 2022-07-09 ENCOUNTER — Telehealth (HOSPITAL_COMMUNITY): Payer: Self-pay | Admitting: *Deleted

## 2022-07-09 NOTE — Telephone Encounter (Signed)
Attempted to call patient regarding upcoming cardiac CT appointment. °Left message on voicemail with name and callback number ° °Meilah Delrosario RN Navigator Cardiac Imaging °[facility] °[phone] Office °[phone] Cell ° °

## 2022-07-10 ENCOUNTER — Telehealth (HOSPITAL_COMMUNITY): Payer: Self-pay | Admitting: *Deleted

## 2022-07-10 NOTE — Telephone Encounter (Signed)
Reaching out to patient to offer assistance regarding upcoming cardiac imaging study; pt verbalizes understanding of appt date/time, parking situation and where to check in, and medications ordered, and verified current allergies; name and call back number provided for further questions should they arise  [facility] [phone] office [phone] cell  After consulting with Dr. Garen Lah, patient to take 100mg  metoprolol tartrate two hours prior to her scan and 60mg  Cardizem 1 hour prior to her scan. She is to hold her diuretics.

## 2022-07-13 ENCOUNTER — Ambulatory Visit
Admission: RE | Admit: 2022-07-13 | Discharge: 2022-07-13 | Disposition: A | Discharge status: Home / Self Care | Payer: No Typology Code available for payment source | Source: Ambulatory Visit | Attending: Internal Medicine | Admitting: Internal Medicine

## 2022-07-13 ENCOUNTER — Other Ambulatory Visit: Payer: Self-pay | Admitting: Internal Medicine

## 2022-07-13 DIAGNOSIS — R0602 Shortness of breath: Secondary | ICD-10-CM | POA: Diagnosis present

## 2022-07-13 DIAGNOSIS — I2089 Other forms of angina pectoris: Secondary | ICD-10-CM

## 2022-07-13 DIAGNOSIS — I509 Heart failure, unspecified: Secondary | ICD-10-CM | POA: Diagnosis present

## 2022-07-13 MED ORDER — NITROGLYCERIN 0.4 MG SL SUBL
0.8000 mg | SUBLINGUAL_TABLET | Freq: Once | SUBLINGUAL | Status: AC
  Administered 2022-07-13: 0.8 mg via SUBLINGUAL
  Filled 2022-07-13: qty 25

## 2022-07-13 MED ORDER — IOHEXOL 350 MG/ML SOLN
100.0000 mL | Freq: Once | INTRAVENOUS | Status: AC | PRN
  Administered 2022-07-13: 100 mL via INTRAVENOUS

## 2022-07-13 MED ORDER — METOPROLOL TARTRATE 5 MG/5ML IV SOLN
10.0000 mg | Freq: Once | INTRAVENOUS | Status: AC
  Administered 2022-07-13: 10 mg via INTRAVENOUS
  Filled 2022-07-13: qty 10

## 2022-07-13 MED ORDER — METOPROLOL TARTRATE 5 MG/5ML IV SOLN
10.0000 mg | Freq: Once | INTRAVENOUS | Status: AC
Start: 2022-07-13 — End: 2022-07-13
  Administered 2022-07-13: 10 mg via INTRAVENOUS
  Filled 2022-07-13 (×2): qty 10

## 2022-07-13 NOTE — Progress Notes (Signed)
Pt IV infiltrated, radiologist at bedside to evaluate pt. Ice pack placed on pt's infiltrated left arm, arm elevated. Pt placed on stretcher for observation.

## 2022-07-15 ENCOUNTER — Other Ambulatory Visit: Payer: Self-pay | Admitting: Cardiology

## 2022-07-15 DIAGNOSIS — R072 Precordial pain: Secondary | ICD-10-CM

## 2022-07-17 ENCOUNTER — Ambulatory Visit: Payer: 59 | Admitting: Family

## 2022-07-19 NOTE — Progress Notes (Unsigned)
Patient ID: Alexandria Moore, female    DOB: 05/28/1961, 61 y.o.   MRN: [id number]  HPI  Alexandria Moore is a 61 y/o female with a history of HTN, PAF, thyroid disease, anxiety, GERD, sleep apnea, tobacco use and chronic heart failure.   ECHO 06/09/22 Left ventricular ejection fraction, by estimation, is 40 to 45%  Admitted 06/08/22 due to Dyspnea and  Community Acquired Pneumonia to left lung. Chest CT negative for PE. Found to be in atrial fibrillation. Cardiology consult obtained. Started on cardizem drip. Antibiotics given along with steroids. Weaned off oxygen. Discharged after 4 days.   Alexandria Moore presents for a follow-up patient today with a chief complaint of minimal fatigue upon moderate exertion. Describes this as chronic in nature. Alexandria Moore has associated blurry vision, shortness of breath (improving), pedal edema (around left ankle), dizziness (with position changes), headaches and weakness along with this. Alexandria Moore denies any difficulty sleeping, abdominal distention, palpitations, chest pain, wheezing, cough or weight gain.   Alexandria Moore says that Alexandria Moore has been referred for a sleep study and this is currently pending.   Past Medical History:  Diagnosis Date   Anxiety    Arrhythmia    PAF   Arthritis    RA   Borderline diabetes    CHF (congestive heart failure) ([place])    COVID-19 11/2019   Dyspnea    OFF AND ON SINCE COVID 2021   GERD (gastroesophageal reflux disease)    Goiter    Heart murmur    not being followed or treated. has lost weight with improvement of murmur   History of blood transfusion    unsure of when Alexandria Moore had transfusion but states Alexandria Moore had a terrible reaction and needed medication   Hypertension    Hyperthyroidism    Hypothyroidism    not on meds at this time   Medical history non-contributory    Pneumonia 11/2019   Rheumatoid arthritis ([place])    Sleep apnea    does not use cpap since weight loss   Past Surgical History:  Procedure Laterality Date   ANTERIOR CERVICAL  DECOMP/DISCECTOMY FUSION N/A 01/18/2020   Procedure: ANTERIOR CERVICAL DECOMPRESSION/DISCECTOMY FUSION 1 LEVEL;  Surgeon: Meade Maw, MD;  Location: [facility] ORS;  Service: Neurosurgery;  Laterality: N/A;  C4-5   CESAREAN SECTION  1982   x 1   COLONOSCOPY WITH PROPOFOL N/A 03/12/2022   Procedure: COLONOSCOPY WITH PROPOFOL;  Surgeon: Lin Landsman, MD;  Location: [facility] ENDOSCOPY;  Service: Gastroenterology;  Laterality: N/A;   TOTAL HIP ARTHROPLASTY Left 05/12/2018   Procedure: TOTAL HIP ARTHROPLASTY ANTERIOR APPROACH;  Surgeon: Hessie Knows, MD;  Location: [facility] ORS;  Service: Orthopedics;  Laterality: Left;   TOTAL KNEE ARTHROPLASTY Right 01/28/2021   Procedure: TOTAL KNEE ARTHROPLASTY;  Surgeon: Hessie Knows, MD;  Location: [facility] ORS;  Service: Orthopedics;  Laterality: Right;   Family History  Problem Relation Age of Onset   Breast cancer Neg Hx    Social History   Tobacco Use   Smoking status: Former    Packs/day: 0.25    Types: Cigarettes    Quit date: 2014    Years since quitting: 9.7   Smokeless tobacco: Never   Tobacco comments:    SMOKED LESS THAN A YEAR   Substance Use Topics   Alcohol use: Yes    Comment: OCC WINE   Allergies  Allergen Reactions   Hydromorphone Anaphylaxis and Shortness Of Breath   Lisinopril Anaphylaxis, Nausea And Vomiting, Swelling and Other (See Comments)  LIP SWELLING   Meloxicam Anaphylaxis and Other (See Comments)    Weakness in Legs Other reaction(s): Other (See Comments) Weakness in Legs Weakness in Legs    Adhesive [Tape] Other (See Comments)    Irritates skin. (band-aids) Paper tape is okay   Prior to Admission medications   Medication Sig Start Date End Date Taking? Authorizing Provider  apixaban (ELIQUIS) 5 MG TABS tablet Take 1 tablet (5 mg total) by mouth 2 (two) times daily. 06/12/22  Yes Wieting, Richard, MD  cholecalciferol (VITAMIN D) 25 MCG (1000 UNIT) tablet Take 2,000 Units by mouth in the morning.   Yes [provider]  diltiazem (CARDIZEM) 30 MG tablet Take 1 tablet (30 mg total) by mouth every 6 (six) hours as needed (for sustained HR >130 for more than 15 mins. do not take if systolic blood pressure (top number) is lower than 100.). 06/12/22  Yes Wieting, Richard, MD  folic acid (FOLVITE) 1 MG tablet Take 1 mg by mouth in the morning. 01/01/21  Yes [provider]  furosemide (LASIX) 20 MG tablet Take 20 mg by mouth daily.   Yes [provider]  gabapentin (NEURONTIN) 300 MG capsule Take 300 mg by mouth 3 (three) times daily as needed (nerve pain/nerve damage). 11/06/19  Yes [provider]  isosorbide-hydrALAZINE (BIDIL) 20-37.5 MG tablet Take 1 tablet by mouth 3 (three) times daily. 07/07/22 07/07/23 Yes [provider]  liothyronine (CYTOMEL) 5 MCG tablet Take 5 mcg by mouth daily. 07/01/22  Yes [provider]  methotrexate (RHEUMATREX) 2.5 MG tablet Take 15 mg by mouth every Sunday. 01/02/21  Yes [provider]  metoprolol tartrate (LOPRESSOR) 100 MG tablet Take 1 tablet (100 mg total) by mouth 2 (two) times daily. 06/12/22  Yes Wieting, Richard, MD  polyethylene glycol (MIRALAX / GLYCOLAX) 17 g packet Take 17 g by mouth daily as needed for moderate constipation. 06/12/22  Yes Wieting, Richard, MD  spironolactone (ALDACTONE) 25 MG tablet Take 0.5 tablets (12.5 mg total) by mouth daily. 06/12/22  Yes Loletha Grayer, MD  SYNTHROID 88 MCG tablet Take 88 mcg by mouth daily before breakfast. 06/16/19  Yes [provider]  PREMARIN vaginal cream SMARTSIG:1 Vaginal Daily Patient not taking: Reported on 06/17/2022 03/24/22   [provider]     Review of Systems  Constitutional:  Positive for fatigue (chronic). Negative for appetite change.  HENT:  Negative for congestion, postnasal drip and sore throat.   Eyes:  Positive for visual disturbance (blurry vision).  Respiratory:  Positive for shortness of breath (improving). Negative for cough  and wheezing.   Cardiovascular:  Positive for leg swelling (left ankle/leg). Negative for chest pain and palpitations.  Gastrointestinal:  Negative for abdominal distention, abdominal pain, diarrhea and nausea.  Endocrine: Negative.   Genitourinary: Negative.   Musculoskeletal:  Positive for joint swelling (left ankle). Negative for arthralgias and back pain.  Allergic/Immunologic: Negative.   Neurological:  Positive for dizziness (with position changes), weakness (some in joints and hands) and headaches.  Hematological:  Negative for adenopathy. Does not bruise/bleed easily.  Psychiatric/Behavioral:  Negative for dysphoric mood and sleep disturbance (three pillows). The patient is not nervous/anxious.    Vitals:   07/20/22 1011  BP: 120/84  Pulse: 97  Resp: 16  SpO2: 100%  Weight: 232 lb 4 oz (105.3 kg)  Height: _0  (1.626 m)   Wt Readings from Last 3 Encounters:  07/20/22 232 lb 4 oz (105.3 kg)  06/17/22 236 lb (107  kg)  06/12/22 246 lb 14.6 oz (112 kg)   Lab Results  Component Value Date   CREATININE 0.78 06/12/2022   CREATININE 0.66 06/11/2022   CREATININE 0.69 06/10/2022   Physical Exam Vitals and nursing note reviewed. Exam conducted with a chaperone present (daughter).  Cardiovascular:     Rate and Rhythm: Normal rate. Rhythm irregular.     Heart sounds: Normal heart sounds.  Pulmonary:     Effort: Pulmonary effort is normal.     Breath sounds: Normal breath sounds. No wheezing.  Abdominal:     Palpations: Abdomen is soft.  Musculoskeletal:     Right lower leg: No edema.     Left lower leg: Edema (trace pitting) present.  Skin:    General: Skin is warm and dry.  Neurological:     Mental Status: Alexandria Moore is alert and oriented to person, place, and time.  Psychiatric:        Mood and Affect: Mood normal.        Behavior: Behavior normal.    Assessment and Plan:  1: Chronic Heart Failure with mildly reduced ejection fraction- - NYHA II - weighing daily;  reminded to call for an overnight weight gain of 3 pounds or more or a weekly weight gain of more than 5 pounds.  - weight down 4 pounds from last visit here 1 month ago - on GDMT of bidil and spironolactone; was took off losartan due to angioedema with lisinopril - will start jardiance 42m daily; 14 day voucher provided and 14 days of samples provided as well - once Alexandria Moore starts the jardiance Alexandria Moore will only take the lasix PRN for above weight gain or worsening swelling or SOB - will check BMP next visit - not adding salt to her food - Discussed wearing compression stockings daily with removal at bedtime - BNP 06/08/22 529.4  2: A-Fib- - in afib today - saw cardiology (Clayborn Bigness 07/07/22; losartan was stopped and bidil added - taking apixaban and metoprolol; diltiazem is PRN  3: HTN- - BP looks good (120/84) - BMP 06/12/22 reviewed and showed Sodium 140 , Potassium 4.4, Creatinine 0.78 , eGFR >60 - Saw PCP (Duanne Moron 11/26/21  4: DM- - Does not monitor blood sugar and no longer on metformin - A1C 5.5 06/08/22  5: Sleep apnea- - saw pulmonology (Raul Del 07/16/22 - has sleep study pending; Alexandria Moore is waiting to hear from them   Medications reviewed with patient and daughter.

## 2022-07-20 ENCOUNTER — Ambulatory Visit: Payer: No Typology Code available for payment source | Attending: Family | Admitting: Family

## 2022-07-20 ENCOUNTER — Encounter: Payer: Self-pay | Admitting: Family

## 2022-07-20 VITALS — BP 120/84 | HR 97 | Resp 16 | Ht 64.0 in | Wt 232.2 lb

## 2022-07-20 DIAGNOSIS — Z7901 Long term (current) use of anticoagulants: Secondary | ICD-10-CM | POA: Insufficient documentation

## 2022-07-20 DIAGNOSIS — I10 Essential (primary) hypertension: Secondary | ICD-10-CM | POA: Diagnosis not present

## 2022-07-20 DIAGNOSIS — H538 Other visual disturbances: Secondary | ICD-10-CM | POA: Diagnosis not present

## 2022-07-20 DIAGNOSIS — T783XXA Angioneurotic edema, initial encounter: Secondary | ICD-10-CM | POA: Insufficient documentation

## 2022-07-20 DIAGNOSIS — K219 Gastro-esophageal reflux disease without esophagitis: Secondary | ICD-10-CM | POA: Diagnosis not present

## 2022-07-20 DIAGNOSIS — I5022 Chronic systolic (congestive) heart failure: Secondary | ICD-10-CM | POA: Diagnosis not present

## 2022-07-20 DIAGNOSIS — E119 Type 2 diabetes mellitus without complications: Secondary | ICD-10-CM | POA: Insufficient documentation

## 2022-07-20 DIAGNOSIS — I480 Paroxysmal atrial fibrillation: Secondary | ICD-10-CM | POA: Insufficient documentation

## 2022-07-20 DIAGNOSIS — G4730 Sleep apnea, unspecified: Secondary | ICD-10-CM | POA: Insufficient documentation

## 2022-07-20 DIAGNOSIS — I110 Hypertensive heart disease with heart failure: Secondary | ICD-10-CM | POA: Insufficient documentation

## 2022-07-20 DIAGNOSIS — I509 Heart failure, unspecified: Secondary | ICD-10-CM | POA: Diagnosis present

## 2022-07-20 DIAGNOSIS — G4733 Obstructive sleep apnea (adult) (pediatric): Secondary | ICD-10-CM

## 2022-07-20 MED ORDER — EMPAGLIFLOZIN 10 MG PO TABS: 10.0000 mg | ORAL_TABLET | Freq: Every day | ORAL | 0 refills | Status: DC

## 2022-07-20 NOTE — Patient Instructions (Addendum)
Continue weighing daily and call for an overnight weight gain of 3 pounds or more or a weekly weight gain of more than 5 pounds.   If you have voicemail, please make sure your mailbox is cleaned out so that we may leave a message and please make sure to listen to any voicemails.    You can have up to 2000mg  of sodium a day   Begin taking jardiance as 1 tablet every morning. When you start taking this, you will only take your lasix if you need it for above weight gain or worsening swelling.

## 2022-08-11 ENCOUNTER — Other Ambulatory Visit: Payer: Self-pay | Admitting: Family

## 2022-08-13 ENCOUNTER — Encounter: Payer: Self-pay | Admitting: Internal Medicine

## 2022-08-13 ENCOUNTER — Ambulatory Visit: Payer: No Typology Code available for payment source | Admitting: Registered Nurse

## 2022-08-13 ENCOUNTER — Ambulatory Visit
Admission: RE | Admit: 2022-08-13 | Discharge: 2022-08-13 | Disposition: A | Discharge status: Home / Self Care | Payer: No Typology Code available for payment source | Attending: Internal Medicine | Admitting: Internal Medicine

## 2022-08-13 ENCOUNTER — Encounter
Admission: RE | Disposition: A | Discharge status: Home / Self Care | Payer: Self-pay | Source: Home / Self Care | Attending: Internal Medicine

## 2022-08-13 DIAGNOSIS — Z87891 Personal history of nicotine dependence: Secondary | ICD-10-CM | POA: Insufficient documentation

## 2022-08-13 DIAGNOSIS — I2510 Atherosclerotic heart disease of native coronary artery without angina pectoris: Secondary | ICD-10-CM | POA: Insufficient documentation

## 2022-08-13 DIAGNOSIS — E039 Hypothyroidism, unspecified: Secondary | ICD-10-CM | POA: Diagnosis not present

## 2022-08-13 DIAGNOSIS — K219 Gastro-esophageal reflux disease without esophagitis: Secondary | ICD-10-CM | POA: Diagnosis not present

## 2022-08-13 DIAGNOSIS — I110 Hypertensive heart disease with heart failure: Secondary | ICD-10-CM | POA: Insufficient documentation

## 2022-08-13 DIAGNOSIS — Z8616 Personal history of COVID-19: Secondary | ICD-10-CM | POA: Insufficient documentation

## 2022-08-13 DIAGNOSIS — I480 Paroxysmal atrial fibrillation: Secondary | ICD-10-CM

## 2022-08-13 DIAGNOSIS — I4891 Unspecified atrial fibrillation: Secondary | ICD-10-CM | POA: Insufficient documentation

## 2022-08-13 DIAGNOSIS — I509 Heart failure, unspecified: Secondary | ICD-10-CM | POA: Diagnosis not present

## 2022-08-13 DIAGNOSIS — G4733 Obstructive sleep apnea (adult) (pediatric): Secondary | ICD-10-CM | POA: Diagnosis not present

## 2022-08-13 DIAGNOSIS — J189 Pneumonia, unspecified organism: Secondary | ICD-10-CM

## 2022-08-13 DIAGNOSIS — E785 Hyperlipidemia, unspecified: Secondary | ICD-10-CM | POA: Diagnosis not present

## 2022-08-13 HISTORY — PX: CARDIOVERSION: SHX1299

## 2022-08-13 SURGERY — CARDIOVERSION
Anesthesia: General

## 2022-08-13 MED ORDER — ACETAMINOPHEN 500 MG PO TABS
ORAL_TABLET | ORAL | Status: AC
  Filled 2022-08-13: qty 2

## 2022-08-13 MED ORDER — SODIUM CHLORIDE 0.9 % IV SOLN: INTRAVENOUS | Status: DC

## 2022-08-13 MED ORDER — ACETAMINOPHEN 500 MG PO TABS
1000.0000 mg | ORAL_TABLET | Freq: Once | ORAL | Status: AC
  Administered 2022-08-13: 1000 mg via ORAL

## 2022-08-13 MED ORDER — ACETAMINOPHEN 325 MG PO TABS: 650.0000 mg | ORAL_TABLET | Freq: Four times a day (QID) | ORAL | Status: DC | PRN

## 2022-08-13 MED ORDER — PROPOFOL 10 MG/ML IV BOLUS
INTRAVENOUS | Status: DC | PRN
  Administered 2022-08-13: 10 mg via INTRAVENOUS
  Administered 2022-08-13: 70 mg via INTRAVENOUS
  Administered 2022-08-13 (×2): 10 mg via INTRAVENOUS

## 2022-08-13 NOTE — Anesthesia Procedure Notes (Signed)
Date/Time: 08/13/2022 7:30 AM  Performed by: Doreen Salvage, CRNAPre-anesthesia Checklist: Patient identified, Emergency Drugs available, Suction available and Patient being monitored Patient Re-evaluated:Patient Re-evaluated prior to induction Oxygen Delivery Method: Nasal cannula Induction Type: IV induction Dental Injury: Teeth and Oropharynx as per pre-operative assessment  Comments: Nasal cannula with etCO2 monitoring

## 2022-08-13 NOTE — Transfer of Care (Signed)
Immediate Anesthesia Transfer of Care Note  Patient: Alexandria Moore  Procedure(s) Performed: Procedure(s): CARDIOVERSION (N/A)  Patient Location: PACU and Short Stay  Anesthesia Type:General  Level of Consciousness: awake, alert  and oriented  Airway & Oxygen Therapy: Patient Spontanous Breathing and Patient connected to nasal cannula oxygen  Post-op Assessment: Report given to RN and Post -op Vital signs reviewed and stable  Post vital signs: Reviewed and stable  Last Vitals:  Vitals:   08/13/22 0747 08/13/22 0748  BP:  121/85  Pulse: 89   Resp: 18 14  Temp:    SpO2: 79% 89%    Complications: No apparent anesthesia complications

## 2022-08-13 NOTE — Anesthesia Preprocedure Evaluation (Signed)
Anesthesia Evaluation  Patient identified by MRN, date of birth, ID band Patient awake    Reviewed: Allergy & Precautions, NPO status , Patient's Chart, lab work & pertinent test results  History of Anesthesia Complications Negative for: history of anesthetic complications  Airway Mallampati: III  TM Distance: >3 FB Neck ROM: full    Dental  (+) Chipped, Dental Advidsory Given   Pulmonary shortness of breath (from a fib), sleep apnea , neg COPD, neg recent URI, former smoker   Pulmonary exam normal        Cardiovascular Exercise Tolerance: Good hypertension, (-) angina +CHF  (-) Past MI and (-) Cardiac Stents + dysrhythmias Atrial Fibrillation + Valvular Problems/Murmurs      Neuro/Psych  PSYCHIATRIC DISORDERS Anxiety     negative neurological ROS     GI/Hepatic Neg liver ROS,GERD  Controlled,,  Endo/Other    Renal/GU negative Renal ROS  negative genitourinary   Musculoskeletal   Abdominal   Peds  Hematology negative hematology ROS (+)   Anesthesia Other Findings Past Medical History: No date: Anxiety No date: Arthritis     Comment:  RA No date: Borderline diabetes No date: CHF (congestive heart failure) ([facility]) 11/2019: COVID-19 No date: Dyspnea     Comment:  OFF AND ON SINCE COVID 2021 No date: GERD (gastroesophageal reflux disease) No date: Goiter No date: Heart murmur     Comment:  not being followed or treated. has lost weight with               improvement of murmur No date: History of blood transfusion     Comment:  unsure of when she had transfusion but states she had a               terrible reaction and needed medication No date: Hypertension No date: Hyperthyroidism No date: Hypothyroidism     Comment:  not on meds at this time No date: Medical history non-contributory 11/2019: Pneumonia No date: Sleep apnea     Comment:  does not use cpap since weight loss  Past Surgical  History: 01/18/2020: ANTERIOR CERVICAL DECOMP/DISCECTOMY FUSION; N/A     Comment:  Procedure: ANTERIOR CERVICAL DECOMPRESSION/DISCECTOMY               FUSION 1 LEVEL;  Surgeon: Venetia Night, MD;                Location: [facility] ORS;  Service: Neurosurgery;  Laterality:               N/A;  C4-5 1982: CESAREAN SECTION     Comment:  x 1 05/12/2018: TOTAL HIP ARTHROPLASTY; Left     Comment:  Procedure: TOTAL HIP ARTHROPLASTY ANTERIOR APPROACH;                Surgeon: Kennedy Bucker, MD;  Location: [facility] ORS;                Service: Orthopedics;  Laterality: Left; 01/28/2021: TOTAL KNEE ARTHROPLASTY; Right     Comment:  Procedure: TOTAL KNEE ARTHROPLASTY;  Surgeon: Kennedy Bucker, MD;  Location: [facility] ORS;  Service: Orthopedics;               Laterality: Right;  BMI    Body Mass Index: 39.82 kg/m      Reproductive/Obstetrics negative OB ROS  Anesthesia Physical Anesthesia Plan  ASA: 3  Anesthesia Plan: General   Post-op Pain Management:    Induction: Intravenous  PONV Risk Score and Plan: Propofol infusion and TIVA  Airway Management Planned: Natural Airway and Nasal Cannula  Additional Equipment:   Intra-op Plan:   Post-operative Plan:   Informed Consent: I have reviewed the patients History and Physical, chart, labs and discussed the procedure including the risks, benefits and alternatives for the proposed anesthesia with the patient or authorized representative who has indicated his/her understanding and acceptance.     Dental Advisory Given  Plan Discussed with: Anesthesiologist, CRNA and Surgeon  Anesthesia Plan Comments: (Patient consented for risks of anesthesia including but not limited to:  - adverse reactions to medications - risk of airway placement if required - damage to eyes, teeth, lips or other oral mucosa - nerve damage due to positioning  - sore throat or hoarseness - Damage to heart,  brain, nerves, lungs, other parts of body or loss of life  Patient voiced understanding.)         Anesthesia Quick Evaluation

## 2022-08-14 ENCOUNTER — Telehealth: Payer: Self-pay | Admitting: Family

## 2022-08-14 ENCOUNTER — Encounter: Payer: Self-pay | Admitting: Internal Medicine

## 2022-08-14 NOTE — Telephone Encounter (Signed)
Returned patient's call about a message she had left about weight gain. LM that the provider and office staff were out of the office today (08/14/22) but that if she needed to, she could take an extra furosemide for a day or two. LM that if she was having worsening symptoms (SOB, swelling etc) in addition to her weight gain, to reach out to Dr. Etta Quill office for further instructions since our office was closed today. Asked her to call our office on Monday 08/17/22 if she felt she needed to be seen.

## 2022-08-18 NOTE — Anesthesia Postprocedure Evaluation (Signed)
Anesthesia Post Note  Patient: Alexandria Moore  Procedure(s) Performed: CARDIOVERSION  Patient location during evaluation: PACU Anesthesia Type: General Level of consciousness: awake and alert Pain management: pain level controlled Vital Signs Assessment: post-procedure vital signs reviewed and stable Respiratory status: spontaneous breathing, nonlabored ventilation, respiratory function stable and patient connected to nasal cannula oxygen Cardiovascular status: blood pressure returned to baseline and stable Postop Assessment: no apparent nausea or vomiting Anesthetic complications: no   No notable events documented.   Last Vitals:  Vitals:   08/13/22 0830 08/13/22 0845  BP: 112/80 108/75  Pulse:    Resp: 15 16  Temp:    SpO2:      Last Pain:  Vitals:   08/13/22 0845  TempSrc:   PainSc: 2                  Martha Clan

## 2022-08-20 NOTE — Progress Notes (Signed)
Patient ID: Alexandria Moore, female    DOB: Jun 18, 1961, 61 y.o.   MRN: [id number]  HPI  Alexandria Moore is a 61 y/o female with a history of HTN, PAF, thyroid disease, anxiety, GERD, sleep apnea, tobacco use and chronic heart failure.   ECHO 06/09/22 Left ventricular ejection fraction, by estimation, is 40 to 45%  Admitted 06/08/22 due to Dyspnea and  Community Acquired Pneumonia to left lung. Chest CT negative for PE. Found to be in atrial fibrillation. Cardiology consult obtained. Started on cardizem drip. Antibiotics given along with steroids. Weaned off oxygen. Discharged after 4 days.   Alexandria Moore presents for a follow-up today with a chief complaint of moderate fatigue with moderate exertion. Alexandria Moore describes this as chronic in nature having been present for several years. Alexandria Moore has associated cough, shortness of breath, pedal edema, headache, light-headedness & generalized weakness. Alexandria Moore denies any difficulty sleeping, abdominal distention, palpitations, chest pain or weight gain.   Says that Alexandria Moore's tolerated the jardiance without known side effects.   Had her home sleep study done last night.   Past Medical History:  Diagnosis Date   Anxiety    Arrhythmia    PAF   Arthritis    RA   Borderline diabetes    CHF (congestive heart failure) ([place])    COVID-19 11/2019   Dyspnea    OFF AND ON SINCE COVID 2021   GERD (gastroesophageal reflux disease)    Goiter    Heart murmur    not being followed or treated. has lost weight with improvement of murmur   History of blood transfusion    unsure of when Alexandria Moore had transfusion but states Alexandria Moore had a terrible reaction and needed medication   Hypertension    Hyperthyroidism    Hypothyroidism    not on meds at this time   Medical history non-contributory    Pneumonia 11/2019   Rheumatoid arthritis ([place])    Sleep apnea    does not use cpap since weight loss   Past Surgical History:  Procedure Laterality Date   ANTERIOR CERVICAL DECOMP/DISCECTOMY FUSION  N/A 01/18/2020   Procedure: ANTERIOR CERVICAL DECOMPRESSION/DISCECTOMY FUSION 1 LEVEL;  Surgeon: Meade Maw, MD;  Location: [facility] ORS;  Service: Neurosurgery;  Laterality: N/A;  C4-5   CARDIOVERSION N/A 08/13/2022   Procedure: CARDIOVERSION;  Surgeon: Yolonda Kida, MD;  Location: [facility] ORS;  Service: Cardiovascular;  Laterality: N/A;   CESAREAN SECTION  1982   x 1   COLONOSCOPY WITH PROPOFOL N/A 03/12/2022   Procedure: COLONOSCOPY WITH PROPOFOL;  Surgeon: Lin Landsman, MD;  Location: [facility] ENDOSCOPY;  Service: Gastroenterology;  Laterality: N/A;   TOTAL HIP ARTHROPLASTY Left 05/12/2018   Procedure: TOTAL HIP ARTHROPLASTY ANTERIOR APPROACH;  Surgeon: Hessie Knows, MD;  Location: [facility] ORS;  Service: Orthopedics;  Laterality: Left;   TOTAL KNEE ARTHROPLASTY Right 01/28/2021   Procedure: TOTAL KNEE ARTHROPLASTY;  Surgeon: Hessie Knows, MD;  Location: [facility] ORS;  Service: Orthopedics;  Laterality: Right;   Family History  Problem Relation Age of Onset   Breast cancer Neg Hx    Social History   Tobacco Use   Smoking status: Former    Packs/day: 0.25    Types: Cigarettes    Quit date: 2014    Years since quitting: 9.8   Smokeless tobacco: Never   Tobacco comments:    SMOKED LESS THAN A YEAR   Substance Use Topics   Alcohol use: Yes    Comment: OCC WINE   Allergies  Allergen Reactions   Hydromorphone Anaphylaxis and Shortness Of Breath   Lisinopril Anaphylaxis, Nausea And Vomiting, Swelling and Other (See Comments)    LIP SWELLING   Meloxicam Anaphylaxis and Other (See Comments)    Weakness in Legs Other reaction(s): Other (See Comments) Weakness in Legs Weakness in Legs    Adhesive [Tape] Other (See Comments)    Irritates skin. (band-aids) Paper tape is okay   Prior to Admission medications   Medication Sig Start Date End Date Taking? Authorizing Provider  apixaban (ELIQUIS) 5 MG TABS tablet Take 1 tablet (5 mg total) by mouth 2 (two) times daily. 06/12/22  Yes  Wieting, Richard, MD  cholecalciferol (VITAMIN D) 25 MCG (1000 UNIT) tablet Take 2,000 Units by mouth in the morning.   Yes [provider]  diltiazem (CARDIZEM) 30 MG tablet Take 1 tablet (30 mg total) by mouth every 6 (six) hours as needed (for sustained HR >130 for more than 15 mins. do not take if systolic blood pressure (top number) is lower than 100.). 06/12/22  Yes Wieting, Richard, MD  empagliflozin (JARDIANCE) 10 MG TABS tablet Take 1 tablet (10 mg total) by mouth daily before breakfast. 08/11/22  Yes Darylene Price A, FNP  folic acid (FOLVITE) 1 MG tablet Take 1 mg by mouth in the morning. 01/01/21  Yes [provider]  furosemide (LASIX) 20 MG tablet Take 20 mg by mouth daily.   Yes [provider]  gabapentin (NEURONTIN) 300 MG capsule Take 300 mg by mouth 3 (three) times daily as needed (nerve pain/nerve damage). 11/06/19  Yes [provider]  liothyronine (CYTOMEL) 5 MCG tablet Take 5 mcg by mouth daily. 07/01/22  Yes [provider]  methotrexate (RHEUMATREX) 2.5 MG tablet Take 15 mg by mouth every Sunday. 01/02/21  Yes [provider]  metoprolol tartrate (LOPRESSOR) 100 MG tablet Take 1 tablet (100 mg total) by mouth 2 (two) times daily. 06/12/22  Yes Wieting, Richard, MD  spironolactone (ALDACTONE) 25 MG tablet Take 0.5 tablets (12.5 mg total) by mouth daily. 06/12/22  Yes Loletha Grayer, MD  SYNTHROID 88 MCG tablet Take 88 mcg by mouth daily before breakfast. 06/16/19  Yes [provider]  polyethylene glycol (MIRALAX / GLYCOLAX) 17 g packet Take 17 g by mouth daily as needed for moderate constipation. Patient not taking: Reported on 08/21/2022 06/12/22   Loletha Grayer, MD   Review of Systems  Constitutional:  Positive for fatigue (chronic). Negative for appetite change.  HENT:  Negative for congestion, postnasal drip and sore throat.   Eyes:  Positive for visual disturbance (blurry vision).  Respiratory:  Positive for cough  and shortness of breath. Negative for wheezing.   Cardiovascular:  Positive for leg swelling (left ankle). Negative for chest pain and palpitations.  Gastrointestinal:  Negative for abdominal distention, abdominal pain, diarrhea and nausea.  Endocrine: Negative.   Genitourinary: Negative.   Musculoskeletal:  Positive for arthralgias (left foot pain). Negative for back pain.  Allergic/Immunologic: Negative.   Neurological:  Positive for weakness (some in joints and hands), light-headedness and headaches. Negative for dizziness.  Hematological:  Negative for adenopathy. Does not bruise/bleed easily.  Psychiatric/Behavioral:  Negative for dysphoric mood and sleep disturbance (three pillows). The patient is not nervous/anxious.    Vitals:   08/21/22 1027  BP: 108/81  Pulse: 87  Resp: 20  SpO2: 100%  Weight: 233 lb 4 oz (105.8 kg)  Height: _0  (1.626 m)   Wt Readings from Last 3 Encounters:  08/21/22  233 lb 4 oz (105.8 kg)  08/13/22 232 lb (105.2 kg)  07/20/22 232 lb 4 oz (105.3 kg)   Lab Results  Component Value Date   CREATININE 1.12 (H) 08/21/2022   CREATININE 0.78 06/12/2022   CREATININE 0.66 06/11/2022   Physical Exam Vitals and nursing note reviewed. Exam conducted with a chaperone present (daughter).  Cardiovascular:     Rate and Rhythm: Normal rate. Rhythm irregular.     Heart sounds: Normal heart sounds.  Pulmonary:     Effort: Pulmonary effort is normal.     Breath sounds: Normal breath sounds. No wheezing.  Abdominal:     Palpations: Abdomen is soft.  Musculoskeletal:     Right lower leg: No edema.     Left lower leg: Edema (trace pitting) present.  Skin:    General: Skin is warm and dry.  Neurological:     Mental Status: Alexandria Moore is alert and oriented to person, place, and time.  Psychiatric:        Mood and Affect: Mood normal.        Behavior: Behavior normal.    Assessment and Plan:  1: Chronic Heart Failure with mildly reduced ejection fraction- - NYHA  II - weighing daily; reminded to call for an overnight weight gain of 3 pounds or more or a weekly weight gain of more than 5 pounds.  - weight stable from last visit here 1 month ago - on GDMT of  jardiance and spironolactone; was took off losartan due to angioedema with lisinopril - was unable to tolerate bidil so it has been stopped - will check BMP today since jardiance was started at last visit - not adding salt to her food - Alexandria Moore has tried wearing compression socks in the past but says that they cut into her legs; advised her to trying using ACE wraps for compression - elevate legs when sitting for long periods of time - consider compression boots if edema persists - BNP 06/08/22 529.4 - received flu vaccine for this season  2: A-Fib- - saw cardiology Clayborn Bigness) 07/31/22 - taking apixaban and metoprolol; diltiazem is PRN and Alexandria Moore hasn't had to take it recently - cardioverted 08/13/22  3: HTN- - BP looks good (108/81) - BMP 06/12/22 reviewed and showed Sodium 140 , Potassium 4.4, Creatinine 0.78 , eGFR >60 - Saw PCP Duanne Moron) 11/26/21  4: DM- - Does not monitor blood sugar and no longer on metformin - A1C 5.5 06/08/22  5: Sleep apnea- - saw pulmonology Raul Del) 08/06/22 - had her home sleep study finished   Medications reviewed with patient and daughter.  Return in 2 months, sooner if needed.

## 2022-08-21 ENCOUNTER — Other Ambulatory Visit
Admission: RE | Admit: 2022-08-21 | Discharge: 2022-08-21 | Disposition: A | Discharge status: Home / Self Care | Payer: No Typology Code available for payment source | Source: Ambulatory Visit | Attending: Family | Admitting: Family

## 2022-08-21 ENCOUNTER — Ambulatory Visit (HOSPITAL_BASED_OUTPATIENT_CLINIC_OR_DEPARTMENT_OTHER): Payer: No Typology Code available for payment source | Admitting: Family

## 2022-08-21 ENCOUNTER — Encounter: Payer: Self-pay | Admitting: Family

## 2022-08-21 VITALS — BP 108/81 | HR 87 | Resp 20 | Ht 64.0 in | Wt 233.2 lb

## 2022-08-21 DIAGNOSIS — I10 Essential (primary) hypertension: Secondary | ICD-10-CM

## 2022-08-21 DIAGNOSIS — G4733 Obstructive sleep apnea (adult) (pediatric): Secondary | ICD-10-CM

## 2022-08-21 DIAGNOSIS — E119 Type 2 diabetes mellitus without complications: Secondary | ICD-10-CM

## 2022-08-21 DIAGNOSIS — I5022 Chronic systolic (congestive) heart failure: Secondary | ICD-10-CM

## 2022-08-21 DIAGNOSIS — I480 Paroxysmal atrial fibrillation: Secondary | ICD-10-CM | POA: Diagnosis not present

## 2022-08-21 LAB — BASIC METABOLIC PANEL
Anion gap: 7 (ref 5–15)
BUN: 25 mg/dL — ABNORMAL HIGH (ref 8–23)
CO2: 27 mmol/L (ref 22–32)
Calcium: 9.5 mg/dL (ref 8.9–10.3)
Chloride: 109 mmol/L (ref 98–111)
Creatinine, Ser: 1.12 mg/dL — ABNORMAL HIGH (ref 0.44–1.00)
GFR, Estimated: 55 mL/min — ABNORMAL LOW (ref >60)
Glucose, Bld: 85 mg/dL (ref 70–99)
Potassium: 4 mmol/L (ref 3.5–5.1)
Sodium: 143 mmol/L (ref 135–145)

## 2022-08-21 NOTE — Patient Instructions (Addendum)
Continue weighing daily and call for an overnight weight gain of 3 pounds or more or a weekly weight gain of more than 5 pounds.   If you have voicemail, please make sure your mailbox is cleaned out so that we may leave a message and please make sure to listen to any voicemails.    Try using ACE wraps on legs

## 2022-09-01 NOTE — CV Procedure (Signed)
Electrical Cardioversion Procedure Note   Procedure: Electrical Cardioversion Indications:  Atrial Fibrillation  Procedure Details Consent: Risks of procedure as well as the alternatives and risks of each were explained to the (patient/caregiver).  Consent for procedure obtained. Time Out: Verified patient identification, verified procedure, site/side was marked, verified correct patient position, special equipment/implants available, medications/allergies/relevent history reviewed, required imaging and test results available.  Performed  Patient placed on cardiac monitor, pulse oximetry, supplemental oxygen as necessary.  Sedation given:  As per anesthesia propofol Pacer pads placed anterior and posterior chest.  Cardioverted  5  time(s).  360J Cardioverted at unsuccessful.  Evaluation Findings: Post procedure EKG shows: Atrial Fibrillation Complications: None Patient did tolerate procedure well.  Conclusion Unsuccessful electrical cardioversion   Dorothyann Peng MD 08/13/22

## 2022-09-06 IMAGING — MG MM DIGITAL SCREENING BILAT W/ TOMO AND CAD
8 series · 8 of 24 positions shown · non-contrast
Comparison: Previous exam(s).

CLINICAL DATA: Screening.

EXAM:
DIGITAL SCREENING BILATERAL MAMMOGRAM WITH TOMOSYNTHESIS AND CAD
TECHNIQUE: Bilateral screening digital craniocaudal and mediolateral oblique
mammograms were obtained. Bilateral screening digital breast
tomosynthesis was performed. The images were evaluated with
computer-aided detection.

[L MLO synth-2D]
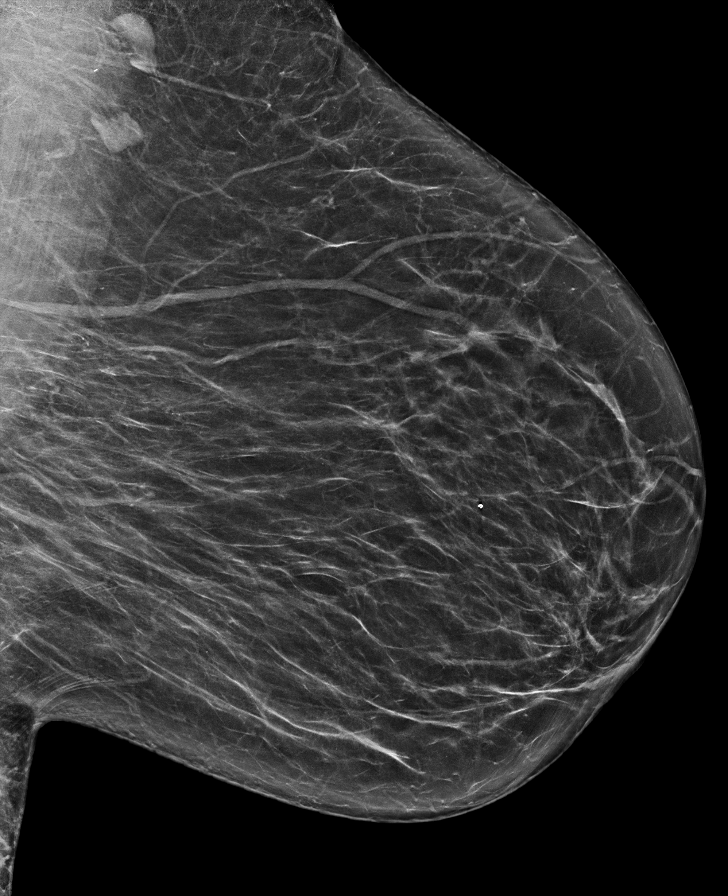

[L CC synth-2D]
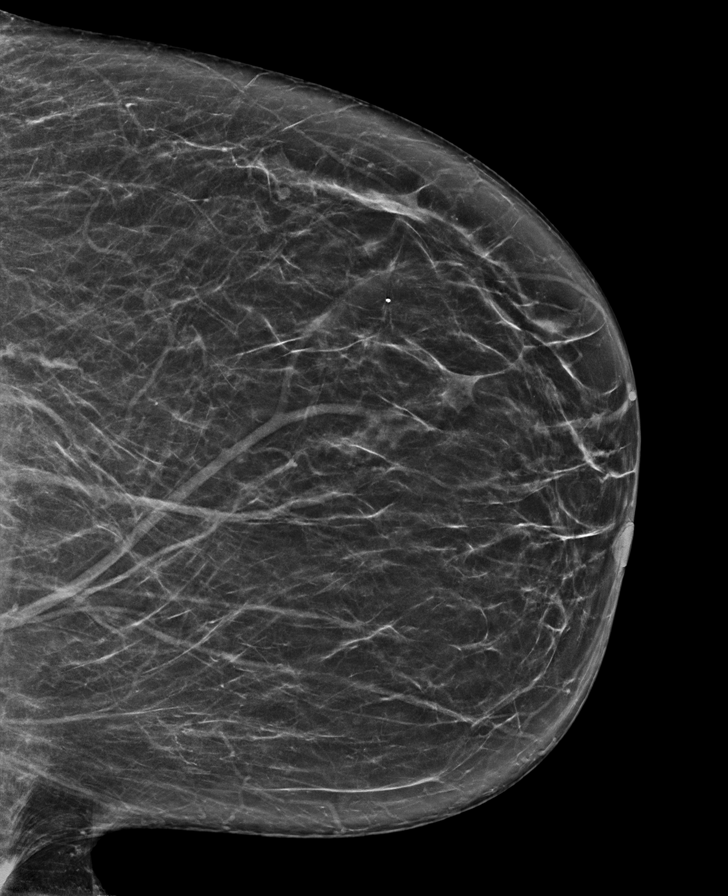

[R CC synth-2D]
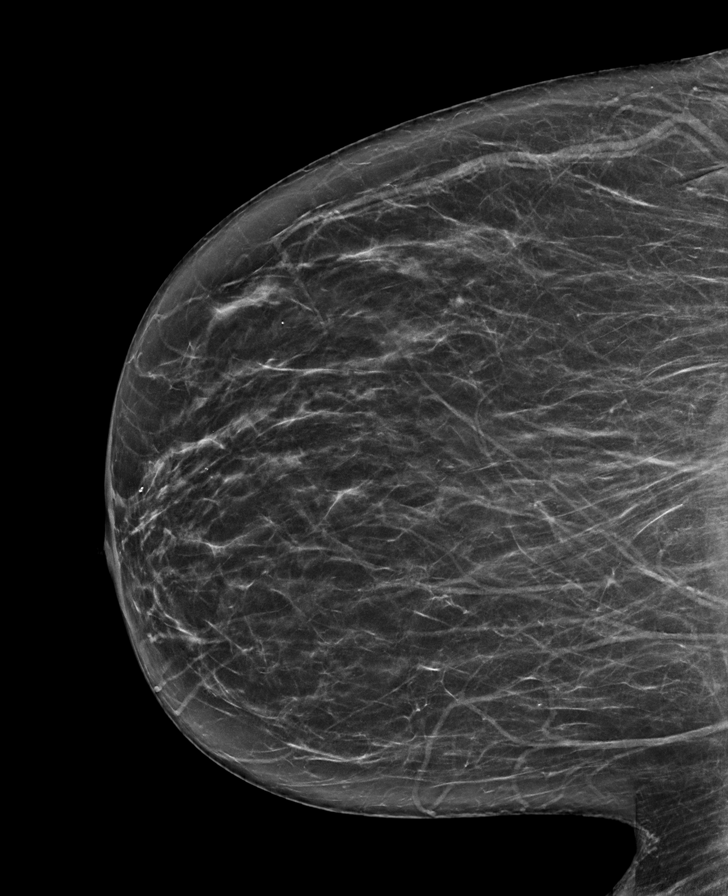

[R MLO synth-2D]
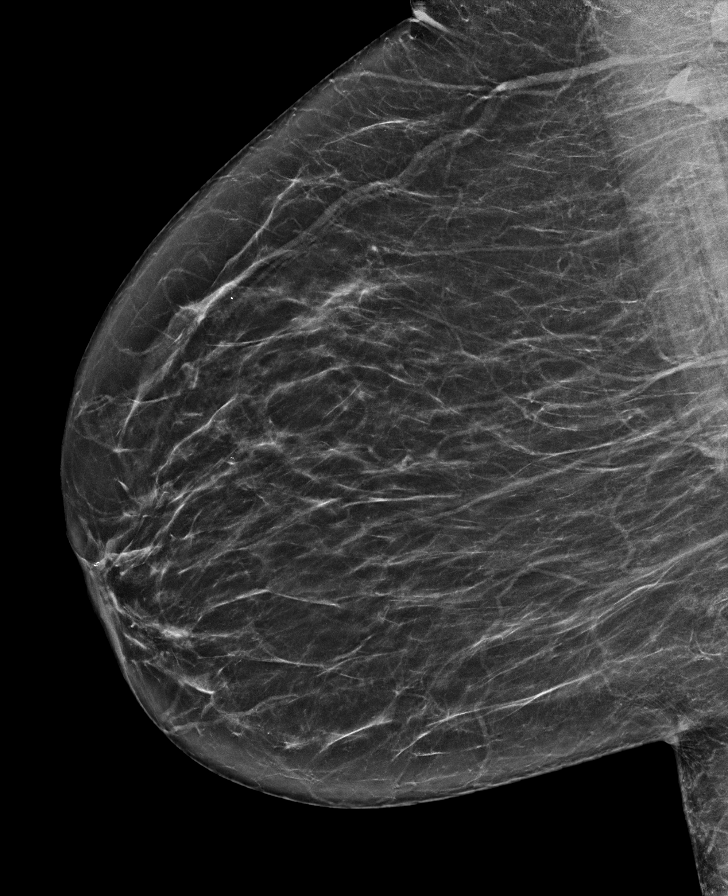

[L CC tomo · tomo slice 35/68.0]
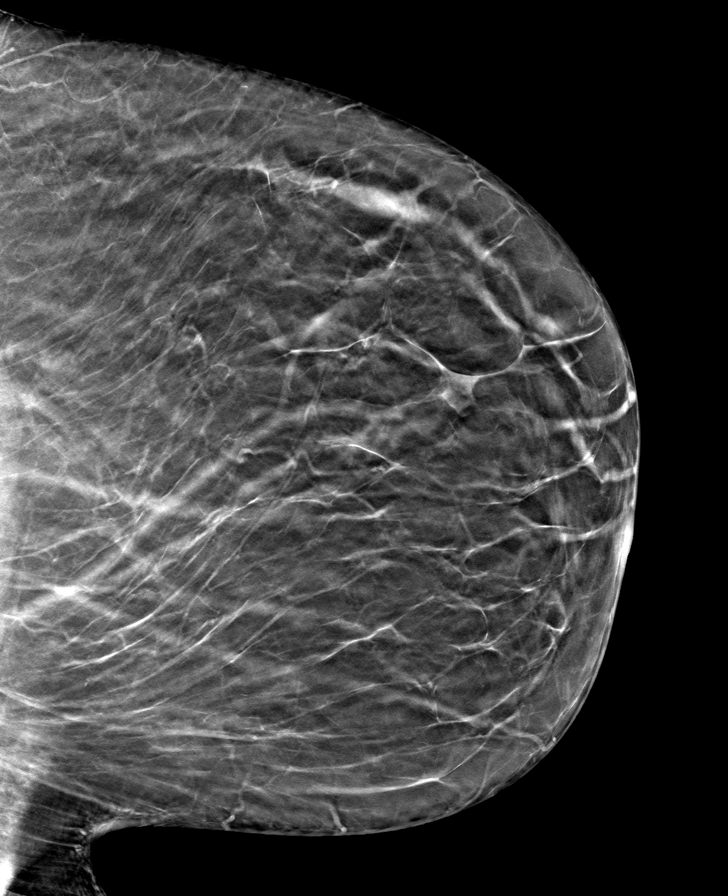

[R MLO tomo · tomo slice 34/67.0]
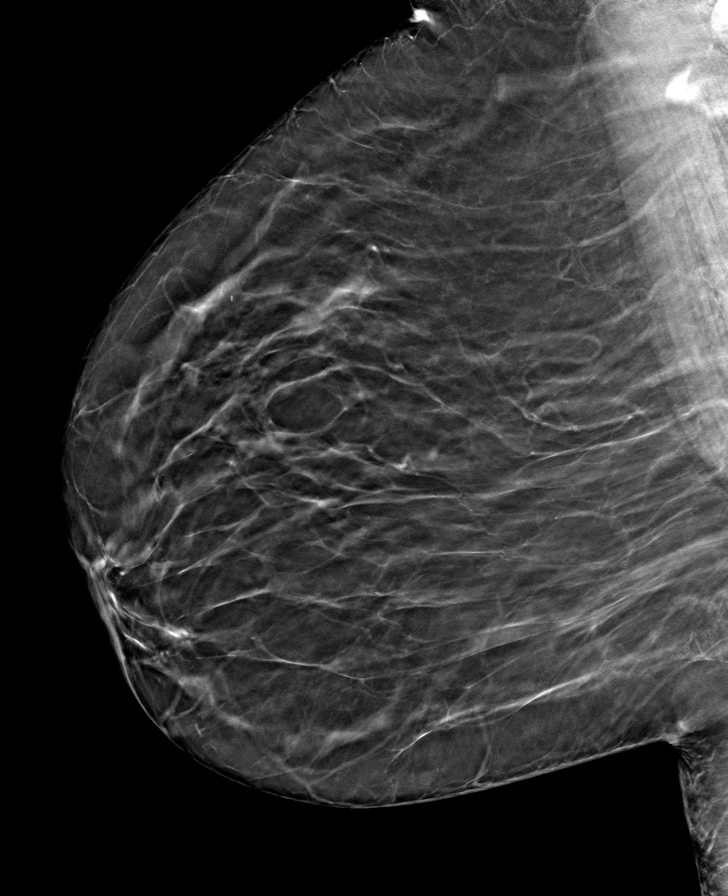

[L MLO tomo · tomo slice 37/74.0]
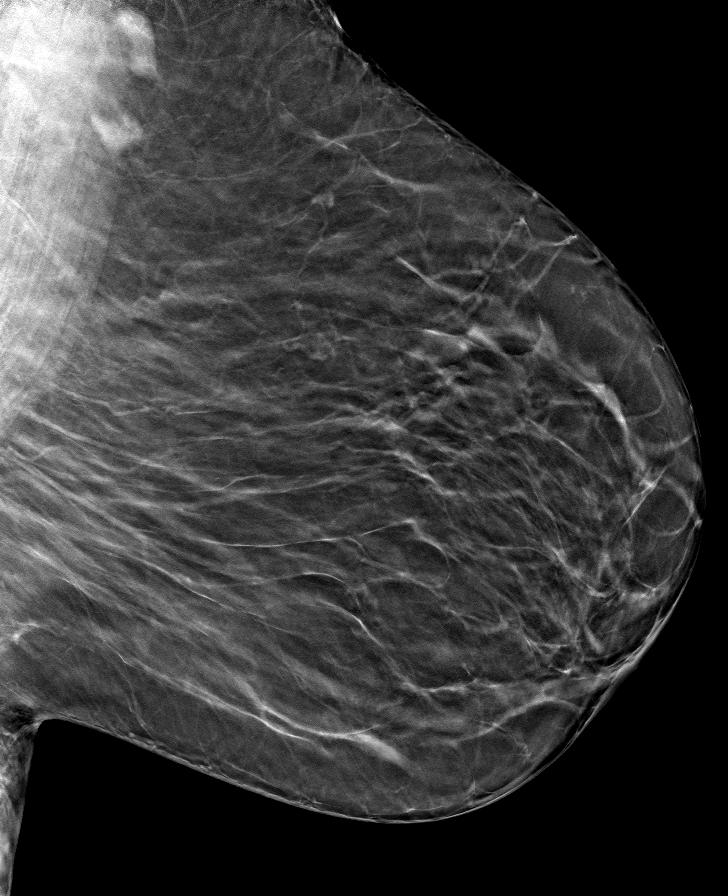

[R CC tomo · tomo slice 33/65.0]
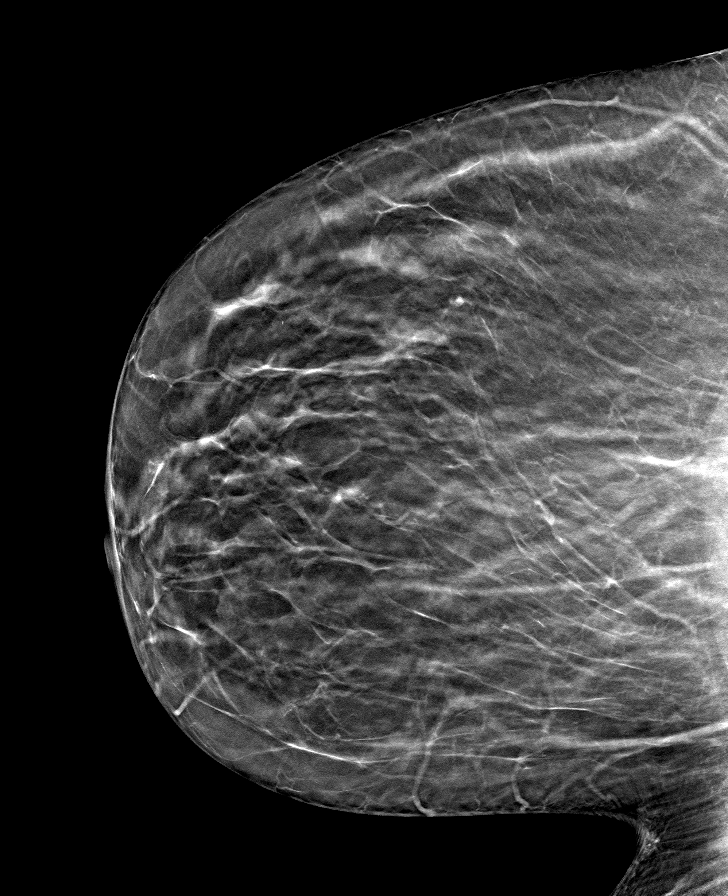

[8 of 24 positions shown; findings below may reference images not displayed]

ACR Breast Density Category b: There are scattered areas of
fibroglandular density.
FINDINGS: There are no findings suspicious for malignancy.
IMPRESSION: No mammographic evidence of malignancy. A result letter of this
screening mammogram will be mailed directly to the patient.

RECOMMENDATION:
Screening mammogram in one year. (Code:51-O-LD2)

BI-RADS CATEGORY  1: Negative.

## 2022-10-21 ENCOUNTER — Ambulatory Visit
Admission: RE | Admit: 2022-10-21 | Discharge: 2022-10-21 | Disposition: A | Discharge status: Home / Self Care | Payer: 59 | Attending: Internal Medicine | Admitting: Internal Medicine

## 2022-10-21 ENCOUNTER — Ambulatory Visit: Payer: 59 | Admitting: Urgent Care

## 2022-10-21 ENCOUNTER — Encounter
Admission: RE | Disposition: A | Discharge status: Home / Self Care | Payer: Self-pay | Source: Home / Self Care | Attending: Internal Medicine

## 2022-10-21 ENCOUNTER — Ambulatory Visit: Payer: No Typology Code available for payment source | Admitting: Family

## 2022-10-21 ENCOUNTER — Encounter: Payer: Self-pay | Admitting: Internal Medicine

## 2022-10-21 DIAGNOSIS — I4891 Unspecified atrial fibrillation: Secondary | ICD-10-CM | POA: Insufficient documentation

## 2022-10-21 DIAGNOSIS — Z7901 Long term (current) use of anticoagulants: Secondary | ICD-10-CM | POA: Insufficient documentation

## 2022-10-21 DIAGNOSIS — I480 Paroxysmal atrial fibrillation: Secondary | ICD-10-CM

## 2022-10-21 DIAGNOSIS — E785 Hyperlipidemia, unspecified: Secondary | ICD-10-CM | POA: Diagnosis not present

## 2022-10-21 DIAGNOSIS — R7303 Prediabetes: Secondary | ICD-10-CM | POA: Diagnosis not present

## 2022-10-21 DIAGNOSIS — M069 Rheumatoid arthritis, unspecified: Secondary | ICD-10-CM | POA: Insufficient documentation

## 2022-10-21 DIAGNOSIS — E039 Hypothyroidism, unspecified: Secondary | ICD-10-CM | POA: Diagnosis not present

## 2022-10-21 DIAGNOSIS — Z6839 Body mass index (BMI) 39.0-39.9, adult: Secondary | ICD-10-CM | POA: Insufficient documentation

## 2022-10-21 DIAGNOSIS — K219 Gastro-esophageal reflux disease without esophagitis: Secondary | ICD-10-CM | POA: Insufficient documentation

## 2022-10-21 DIAGNOSIS — G4733 Obstructive sleep apnea (adult) (pediatric): Secondary | ICD-10-CM | POA: Insufficient documentation

## 2022-10-21 DIAGNOSIS — E669 Obesity, unspecified: Secondary | ICD-10-CM | POA: Insufficient documentation

## 2022-10-21 DIAGNOSIS — Z87891 Personal history of nicotine dependence: Secondary | ICD-10-CM | POA: Diagnosis not present

## 2022-10-21 DIAGNOSIS — Z79899 Other long term (current) drug therapy: Secondary | ICD-10-CM | POA: Insufficient documentation

## 2022-10-21 DIAGNOSIS — I110 Hypertensive heart disease with heart failure: Secondary | ICD-10-CM | POA: Diagnosis not present

## 2022-10-21 DIAGNOSIS — I4819 Other persistent atrial fibrillation: Secondary | ICD-10-CM | POA: Diagnosis not present

## 2022-10-21 DIAGNOSIS — I509 Heart failure, unspecified: Secondary | ICD-10-CM | POA: Insufficient documentation

## 2022-10-21 HISTORY — PX: CARDIOVERSION: SHX1299

## 2022-10-21 LAB — PROTIME-INR
INR: 1.2 (ref 0.8–1.2)
Prothrombin Time: 15.2 seconds (ref 11.4–15.2)

## 2022-10-21 SURGERY — CARDIOVERSION
Anesthesia: General

## 2022-10-21 MED ORDER — ACETAMINOPHEN 160 MG/5ML PO SOLN: 325.0000 mg | ORAL | Status: DC | PRN

## 2022-10-21 MED ORDER — ACETAMINOPHEN 325 MG PO TABS: 650.0000 mg | ORAL_TABLET | Freq: Once | ORAL | Status: DC | PRN

## 2022-10-21 MED ORDER — SODIUM CHLORIDE 0.9 % IV SOLN: INTRAVENOUS | Status: DC

## 2022-10-21 MED ORDER — PROPOFOL 10 MG/ML IV BOLUS
INTRAVENOUS | Status: DC | PRN
  Administered 2022-10-21 (×3): 10 mg via INTRAVENOUS
  Administered 2022-10-21: 90 mg via INTRAVENOUS

## 2022-10-21 MED ORDER — ONDANSETRON HCL 4 MG/2ML IJ SOLN: 4.0000 mg | Freq: Once | INTRAMUSCULAR | Status: DC | PRN

## 2022-10-21 MED ORDER — ACETAMINOPHEN 500 MG PO TABS
ORAL_TABLET | ORAL | Status: AC
  Filled 2022-10-21: qty 2

## 2022-10-21 MED ORDER — ACETAMINOPHEN 500 MG PO TABS
1000.0000 mg | ORAL_TABLET | Freq: Once | ORAL | Status: AC
  Administered 2022-10-21: 1000 mg via ORAL

## 2022-10-21 NOTE — Progress Notes (Unsigned)
Patient ID: Alexandria Moore, female    DOB: 04-Sep-1961, 62 y.o.   MRN: [id number]  HPI  Alexandria Moore is a 62 y/o female with a history of HTN, PAF, thyroid disease, anxiety, GERD, sleep apnea, tobacco use and chronic heart failure.   ECHO 06/09/22 Left ventricular ejection fraction, by estimation, is 40 to 45%  Admitted 06/08/22 due to Dyspnea and  Community Acquired Pneumonia to left lung. Chest CT negative for PE. Found to be in atrial fibrillation. Cardiology consult obtained. Started on cardizem drip. Antibiotics given along with steroids. Weaned off oxygen. Discharged after 4 days.   Cardioversion done 10/21/22.  She presents for a follow-up today with a chief complaint of   Past Medical History:  Diagnosis Date   Anxiety    Arrhythmia    PAF   Arthritis    RA   Borderline diabetes    CHF (congestive heart failure) ([place])    COVID-19 11/2019   Dyspnea    OFF AND ON SINCE COVID 2021   GERD (gastroesophageal reflux disease)    Goiter    Heart murmur    not being followed or treated. has lost weight with improvement of murmur   History of blood transfusion    unsure of when she had transfusion but states she had a terrible reaction and needed medication   Hypertension    Hyperthyroidism    Hypothyroidism    not on meds at this time   Medical history non-contributory    Pneumonia 11/2019   Rheumatoid arthritis ([place])    Sleep apnea    does not use cpap since weight loss   Past Surgical History:  Procedure Laterality Date   ANTERIOR CERVICAL DECOMP/DISCECTOMY FUSION N/A 01/18/2020   Procedure: ANTERIOR CERVICAL DECOMPRESSION/DISCECTOMY FUSION 1 LEVEL;  Surgeon: Meade Maw, MD;  Location: [facility] ORS;  Service: Neurosurgery;  Laterality: N/A;  C4-5   CARDIOVERSION N/A 08/13/2022   Procedure: CARDIOVERSION;  Surgeon: Yolonda Kida, MD;  Location: [facility] ORS;  Service: Cardiovascular;  Laterality: N/A;   CESAREAN SECTION  1982   x 1   COLONOSCOPY WITH PROPOFOL N/A  03/12/2022   Procedure: COLONOSCOPY WITH PROPOFOL;  Surgeon: Lin Landsman, MD;  Location: [facility] ENDOSCOPY;  Service: Gastroenterology;  Laterality: N/A;   TOTAL HIP ARTHROPLASTY Left 05/12/2018   Procedure: TOTAL HIP ARTHROPLASTY ANTERIOR APPROACH;  Surgeon: Hessie Knows, MD;  Location: [facility] ORS;  Service: Orthopedics;  Laterality: Left;   TOTAL KNEE ARTHROPLASTY Right 01/28/2021   Procedure: TOTAL KNEE ARTHROPLASTY;  Surgeon: Hessie Knows, MD;  Location: [facility] ORS;  Service: Orthopedics;  Laterality: Right;   Family History  Problem Relation Age of Onset   Breast cancer Neg Hx    Social History   Tobacco Use   Smoking status: Former    Packs/day: 0.25    Types: Cigarettes    Quit date: 2014    Years since quitting: 10.0   Smokeless tobacco: Never   Tobacco comments:    SMOKED LESS THAN A YEAR   Substance Use Topics   Alcohol use: Yes    Comment: OCC WINE   Allergies  Allergen Reactions   Hydromorphone Anaphylaxis and Shortness Of Breath   Lisinopril Anaphylaxis, Nausea And Vomiting, Swelling and Other (See Comments)    LIP SWELLING   Meloxicam Anaphylaxis and Other (See Comments)    Weakness in Legs Other reaction(s): Other (See Comments) Weakness in Legs Weakness in Legs    Adhesive [Tape] Other (See Comments)  Irritates skin. (band-aids) Paper tape is okay    Review of Systems  Constitutional:  Positive for fatigue (chronic). Negative for appetite change.  HENT:  Negative for congestion, postnasal drip and sore throat.   Eyes:  Positive for visual disturbance (blurry vision).  Respiratory:  Positive for cough and shortness of breath. Negative for wheezing.   Cardiovascular:  Positive for leg swelling (left ankle). Negative for chest pain and palpitations.  Gastrointestinal:  Negative for abdominal distention, abdominal pain, diarrhea and nausea.  Endocrine: Negative.   Genitourinary: Negative.   Musculoskeletal:  Positive for arthralgias (left foot pain).  Negative for back pain.  Allergic/Immunologic: Negative.   Neurological:  Positive for weakness (some in joints and hands), light-headedness and headaches. Negative for dizziness.  Hematological:  Negative for adenopathy. Does not bruise/bleed easily.  Psychiatric/Behavioral:  Negative for dysphoric mood and sleep disturbance (three pillows). The patient is not nervous/anxious.      Physical Exam Vitals and nursing note reviewed. Exam conducted with a chaperone present (daughter).  Cardiovascular:     Rate and Rhythm: Normal rate. Rhythm irregular.     Heart sounds: Normal heart sounds.  Pulmonary:     Effort: Pulmonary effort is normal.     Breath sounds: Normal breath sounds. No wheezing.  Abdominal:     Palpations: Abdomen is soft.  Musculoskeletal:     Right lower leg: No edema.     Left lower leg: Edema (trace pitting) present.  Skin:    General: Skin is warm and dry.  Neurological:     Mental Status: She is alert and oriented to person, place, and time.  Psychiatric:        Mood and Affect: Mood normal.        Behavior: Behavior normal.    Assessment and Plan:  1: Chronic Heart Failure with mildly reduced ejection fraction- - NYHA II - weighing daily; reminded to call for an overnight weight gain of 3 pounds or more or a weekly weight gain of more than 5 pounds.  - weight 233.4 from last visit here 2 months ago - on GDMT of  jardiance and spironolactone; was took off losartan due to angioedema with lisinopril - was unable to tolerate bidil so it has been stopped - not adding salt to her food - she has tried wearing compression socks in the past but says that they cut into her legs; advised her to trying using ACE wraps for compression - elevate legs when sitting for long periods of time - consider compression boots if edema persists - BNP 06/08/22 529.4 - received flu vaccine for this season  2: A-Fib- - saw cardiology Clayborn Bigness) 09/23/22 - taking apixaban and  metoprolol; diltiazem is PRN and she hasn't had to take it recently - cardioverted 08/13/22 & again 10/21/22  3: HTN- - BP  - Saw PCP Duanne Moron) 11/26/21 - BMP 08/21/22 showed sodium 143, potassium 4.0, creatinine 1.12 & GFR 55  4: DM- - Does not monitor blood sugar and no longer on metformin - A1C 5.5 06/08/22  5: Sleep apnea- - saw pulmonology Raul Del) 08/06/22 - had her home sleep study finished   Medications reviewed with patient and daughter.

## 2022-10-21 NOTE — Anesthesia Preprocedure Evaluation (Addendum)
Anesthesia Evaluation  Patient identified by MRN, date of birth, ID band Patient awake    Reviewed: Allergy & Precautions, NPO status , Patient's Chart, lab work & pertinent test results  History of Anesthesia Complications Negative for: history of anesthetic complications  Airway Mallampati: III   Neck ROM: Full    Dental  (+) Missing, Loose   Pulmonary shortness of breath (2/2 a fib), sleep apnea , former smoker (quit 2014)   Pulmonary exam normal breath sounds clear to auscultation       Cardiovascular hypertension, +CHF (EF 40-45%)  + dysrhythmias (a fib on Eliquis)  Rhythm:Irregular Rate:Normal  Echo 06/09/22: 1. Left ventricular ejection fraction, by estimation, is 40 to 45%. The left ventricle has mildly decreased function. The left ventricle demonstrates global hypokinesis. The left ventricular internal cavity size was moderately dilated. Left ventricular diastolic function could not be evaluated.  2. Right ventricular systolic function is low normal. The right ventricular size is mildly enlarged.  3. Left atrial size was mildly dilated.  4. Right atrial size was mildly dilated.  5. The mitral valve is normal in structure. Trivial mitral valve regurgitation.  6. The aortic valve is normal in structure. Aortic valve regurgitation is not visualized.   Myocardial Perfusion 06/30/22: Mildly abnormal myocardial scan no clear evidence of stress-induced myocardial ischemia mildly depressed left ventricular function around 43% with LVH, conclusion intermediate scan    Neuro/Psych  PSYCHIATRIC DISORDERS Anxiety     negative neurological ROS     GI/Hepatic ,GERD  ,,  Endo/Other  Prediabetes, obesity  Renal/GU negative Renal ROS     Musculoskeletal  (+) Arthritis , Rheumatoid disorders,    Abdominal   Peds  Hematology negative hematology ROS (+)   Anesthesia Other Findings Cardiology note 09/23/22:  62 y.o. female with   1. Congestive heart failure, unspecified HF chronicity, unspecified heart failure type (CMS-HCC)  2. SOB (shortness of breath)  3. A-fib 4. Hypertension 5. Hyperlipidemia 6. Bilateral edema of lower extremity 7. Obesity 8. GERD 10. OSA 11. CM   Plan   CHF, most recent echo showed decreased heart function, EF=40-45%, continue spirolactone, Eliquis, and Entresto.  A-fib, EKG today showed a-fib, continue Eliquis for anticoagulation therapy, decrease amiodarone to 200 mg once daliy, recommend cardioversion. Decrease metoprolol to 50 mg twice daily SOB, echo from (05/2022), showed decreased heart function, continue medical therapy above CM, recommend medical therapy above Edema, recommend BLE elevation and support stockings.  Hypertension, controlled, BP today is 124/78, continue current medical therapy. Hyperlipidemia, recommend healthy diet, continuing current management Obesity, recommend exercise, portion control and weight loss GERD, management per PCP OSA, recommend sleep study, CPAP if indicated, and weight loss Have a patient follow up in 1 month    Reproductive/Obstetrics                             Anesthesia Physical Anesthesia Plan  ASA: 3  Anesthesia Plan: General   Post-op Pain Management:    Induction: Intravenous  PONV Risk Score and Plan: 3 and Propofol infusion, TIVA and Treatment may vary due to age or medical condition  Airway Management Planned: Natural Airway  Additional Equipment:   Intra-op Plan:   Post-operative Plan:   Informed Consent: I have reviewed the patients History and Physical, chart, labs and discussed the procedure including the risks, benefits and alternatives for the proposed anesthesia with the patient or authorized representative who has indicated his/her understanding and acceptance.  Plan Discussed with: CRNA  Anesthesia Plan Comments: (LMA/GETA backup discussed.  Patient consented for risks  of anesthesia including but not limited to:  - adverse reactions to medications - damage to eyes, teeth, lips or other oral mucosa - nerve damage due to positioning  - sore throat or hoarseness - damage to heart, brain, nerves, lungs, other parts of body or loss of life  Informed patient about role of CRNA in peri- and intra-operative care.  Patient voiced understanding.)        Anesthesia Quick Evaluation

## 2022-10-21 NOTE — Anesthesia Postprocedure Evaluation (Signed)
Anesthesia Post Note  Patient: Alexandria Moore  Procedure(s) Performed: CARDIOVERSION  Patient location during evaluation: PACU Anesthesia Type: General Level of consciousness: awake and alert, oriented and patient cooperative Pain management: pain level controlled Vital Signs Assessment: post-procedure vital signs reviewed and stable Respiratory status: spontaneous breathing, nonlabored ventilation and respiratory function stable Cardiovascular status: blood pressure returned to baseline and stable Postop Assessment: adequate PO intake Anesthetic complications: no   No notable events documented.   Last Vitals:  Vitals:   10/21/22 0805 10/21/22 0810  BP:  (!) 124/93  Pulse: 71 73  Resp:  14  Temp:    SpO2: 100% 98%    Last Pain:  Vitals:   10/21/22 0810  TempSrc:   PainSc: 0-No pain                 Darrin Nipper

## 2022-10-21 NOTE — Transfer of Care (Signed)
Immediate Anesthesia Transfer of Care Note  Patient: Alexandria Moore  Procedure(s) Performed: CARDIOVERSION  Patient Location: PACU  Anesthesia Type:General  Level of Consciousness: awake, alert , and oriented  Airway & Oxygen Therapy: Patient Spontanous Breathing  Post-op Assessment: Report given to RN and Post -op Vital signs reviewed and stable  Post vital signs: Reviewed and stable  Last Vitals:  Vitals Value Taken Time  BP 129/87 10/21/22 0803  Temp    Pulse 71 10/21/22 0805  Resp 15 10/21/22 0804  SpO2 100 % 10/21/22 0805    Last Pain:  Vitals:   10/21/22 0715  TempSrc: Oral  PainSc: 0-No pain         Complications: No notable events documented.

## 2022-10-22 ENCOUNTER — Encounter: Payer: Self-pay | Admitting: Family

## 2022-10-22 ENCOUNTER — Ambulatory Visit (HOSPITAL_BASED_OUTPATIENT_CLINIC_OR_DEPARTMENT_OTHER): Payer: 59 | Admitting: Family

## 2022-10-22 ENCOUNTER — Encounter: Payer: Self-pay | Admitting: Pharmacy Technician

## 2022-10-22 ENCOUNTER — Other Ambulatory Visit
Admission: RE | Admit: 2022-10-22 | Discharge: 2022-10-22 | Disposition: A | Discharge status: Home / Self Care | Payer: 59 | Source: Ambulatory Visit | Attending: Family | Admitting: Family

## 2022-10-22 VITALS — BP 129/80 | HR 68 | Wt 233.5 lb

## 2022-10-22 DIAGNOSIS — Z7984 Long term (current) use of oral hypoglycemic drugs: Secondary | ICD-10-CM | POA: Insufficient documentation

## 2022-10-22 DIAGNOSIS — R0602 Shortness of breath: Secondary | ICD-10-CM | POA: Insufficient documentation

## 2022-10-22 DIAGNOSIS — G4730 Sleep apnea, unspecified: Secondary | ICD-10-CM | POA: Insufficient documentation

## 2022-10-22 DIAGNOSIS — I5022 Chronic systolic (congestive) heart failure: Secondary | ICD-10-CM | POA: Insufficient documentation

## 2022-10-22 DIAGNOSIS — Z79899 Other long term (current) drug therapy: Secondary | ICD-10-CM | POA: Insufficient documentation

## 2022-10-22 DIAGNOSIS — Z8616 Personal history of COVID-19: Secondary | ICD-10-CM | POA: Insufficient documentation

## 2022-10-22 DIAGNOSIS — I110 Hypertensive heart disease with heart failure: Secondary | ICD-10-CM | POA: Insufficient documentation

## 2022-10-22 DIAGNOSIS — I480 Paroxysmal atrial fibrillation: Secondary | ICD-10-CM | POA: Insufficient documentation

## 2022-10-22 DIAGNOSIS — Z87891 Personal history of nicotine dependence: Secondary | ICD-10-CM | POA: Insufficient documentation

## 2022-10-22 DIAGNOSIS — I10 Essential (primary) hypertension: Secondary | ICD-10-CM | POA: Diagnosis not present

## 2022-10-22 DIAGNOSIS — I5032 Chronic diastolic (congestive) heart failure: Secondary | ICD-10-CM | POA: Insufficient documentation

## 2022-10-22 DIAGNOSIS — E119 Type 2 diabetes mellitus without complications: Secondary | ICD-10-CM | POA: Insufficient documentation

## 2022-10-22 DIAGNOSIS — G4733 Obstructive sleep apnea (adult) (pediatric): Secondary | ICD-10-CM

## 2022-10-22 LAB — BASIC METABOLIC PANEL
Anion gap: 9 (ref 5–15)
BUN: 23 mg/dL (ref 8–23)
CO2: 25 mmol/L (ref 22–32)
Calcium: 9.2 mg/dL (ref 8.9–10.3)
Chloride: 106 mmol/L (ref 98–111)
Creatinine, Ser: 1.08 mg/dL — ABNORMAL HIGH (ref 0.44–1.00)
GFR, Estimated: 58 mL/min — ABNORMAL LOW (ref >60)
Glucose, Bld: 75 mg/dL (ref 70–99)
Potassium: 4.1 mmol/L (ref 3.5–5.1)
Sodium: 140 mmol/L (ref 135–145)

## 2022-10-22 LAB — LIPID PANEL
Cholesterol: 238 mg/dL — ABNORMAL HIGH (ref 0–200)
HDL: 75 mg/dL (ref >40)
LDL Cholesterol: 154 mg/dL — ABNORMAL HIGH (ref 0–99)
Total CHOL/HDL Ratio: 3.2 RATIO
Triglycerides: 47 mg/dL (ref <150)
VLDL: 9 mg/dL (ref 0–40)

## 2022-10-22 NOTE — Progress Notes (Signed)
Alexandria Moore - PHARMACIST COUNSELING NOTE  Guideline-Directed Medical Therapy/Evidence Based Medicine  ACE/ARB/ARNI:  None Beta Blocker: Metoprolol tartrate 100 mg twice daily Aldosterone Antagonist: Spironolactone 12.5 mg daily Diuretic: Furosemide 20 mg daily SGLT2i: Empagliflozin 10 mg daily  Adherence Assessment  Do you ever forget to take your medication? [] Yes [x] No  Do you ever skip doses due to side effects? [] Yes [x] No  Do you have trouble affording your medicines? [] Yes [x] No  Are you ever unable to pick up your medication due to transportation difficulties? [] Yes [x] No  Do you ever stop taking your medications because you don't believe they are helping? [] Yes [x] No  Do you check your weight daily? [x] Yes [] No   Adherence strategy: Pill box  Barriers to obtaining medications: None, recently changed insurances  Vital signs: HR 63, BP 129/80, weight (pounds) 234 ECHO: Date 05/2022, EF 40-45%, notes LV demonstrates global hypokinesis. No LVH. Trivial MV regurg.     Latest Ref Rng & Units 08/21/2022   11:10 AM 06/12/2022    4:46 AM 06/11/2022    7:08 AM  BMP  Glucose 70 - 99 mg/dL 85  118  170   BUN 8 - 23 mg/dL 25  19  13    Creatinine 0.44 - 1.00 mg/dL 1.12  0.78  0.66   Sodium 135 - 145 mmol/L 143  140  139   Potassium 3.5 - 5.1 mmol/L 4.0  4.1  4.6   Chloride 98 - 111 mmol/L 109  113  111   CO2 22 - 32 mmol/L 27  23  22    Calcium 8.9 - 10.3 mg/dL 9.5  9.0  9.0     Past Medical History:  Diagnosis Date   Anxiety    Arrhythmia    PAF   Arthritis    RA   Borderline diabetes    CHF (congestive heart failure) ([place])    COVID-19 11/2019   Dyspnea    OFF AND ON SINCE COVID 2021   GERD (gastroesophageal reflux disease)    Goiter    Heart murmur    not being followed or treated. has lost weight with improvement of murmur   History of blood transfusion    unsure of when she had transfusion but states she had a  terrible reaction and needed medication   Hypertension    Hyperthyroidism    Hypothyroidism    not on meds at this time   Medical history non-contributory    Pneumonia 11/2019   Rheumatoid arthritis ([place])    Sleep apnea    does not use cpap since weight loss    ASSESSMENT 62 year old female with PMH HTN, Afib, HLD who presents to the HF clinic for follow-up. Last ECHO 05/2022 shows EF 40-45%. Regarding GDMT, patient is on Jardiance 10 mg and Spironolactone 12.5 mg daily. Patient's baseline Scr is 0.8, but last visit had increased to 1.13. Patient also has a history of HLD noted in chart by cardiology with the recommendation to continue current management, but patient not taking any cholesterol medication and no lipid checks have been performed per chart review. Notably, patient was cardioverted yesterday which has enabled her to stop amiodarone and liothyronine.  Recent ED Visit (past 6 months): Date - 05/2022, [facility] - Dyspnea  PLAN Recommend checking BMP and lipid panel.  Time spent: 20 minutes  Will M. Ouida Sills, PharmD PGY-1 Pharmacy Resident 10/22/2022 11:39 AM   Current Outpatient Medications:    apixaban (ELIQUIS) 5  MG TABS tablet, Take 1 tablet (5 mg total) by mouth 2 (two) times daily., Disp: 60 tablet, Rfl: 0   cholecalciferol (VITAMIN D) 25 MCG (1000 UNIT) tablet, Take 2,000 Units by mouth in the morning., Disp: , Rfl:    diltiazem (CARDIZEM) 30 MG tablet, Take 1 tablet (30 mg total) by mouth every 6 (six) hours as needed (for sustained HR >130 for more than 15 mins. do not take if systolic blood pressure (top number) is lower than 100.)., Disp: 60 tablet, Rfl: 0   empagliflozin (JARDIANCE) 10 MG TABS tablet, Take 1 tablet (10 mg total) by mouth daily before breakfast., Disp: 30 tablet, Rfl: 5   folic acid (FOLVITE) 1 MG tablet, Take 1 mg by mouth in the morning., Disp: , Rfl:    furosemide (LASIX) 20 MG tablet, Take 20 mg by mouth daily., Disp: , Rfl:    gabapentin (NEURONTIN)  300 MG capsule, Take 300 mg by mouth 3 (three) times daily as needed (nerve pain/nerve damage)., Disp: , Rfl:    liothyronine (CYTOMEL) 5 MCG tablet, Take 5 mcg by mouth daily., Disp: , Rfl:    methotrexate (RHEUMATREX) 2.5 MG tablet, Take 15 mg by mouth every Sunday., Disp: , Rfl:    metoprolol tartrate (LOPRESSOR) 100 MG tablet, Take 1 tablet (100 mg total) by mouth 2 (two) times daily., Disp: 60 tablet, Rfl: 0   polyethylene glycol (MIRALAX / GLYCOLAX) 17 g packet, Take 17 g by mouth daily as needed for moderate constipation. (Patient not taking: Reported on 08/21/2022), Disp: 30 each, Rfl: 0   spironolactone (ALDACTONE) 25 MG tablet, Take 0.5 tablets (12.5 mg total) by mouth daily., Disp: 15 tablet, Rfl: 0   SYNTHROID 88 MCG tablet, Take 88 mcg by mouth daily before breakfast., Disp: , Rfl:    DRUGS TO CAUTION IN HEART FAILURE  Drug or Class Mechanism  Analgesics NSAIDs COX-2 inhibitors Glucocorticoids  Sodium and water retention, increased systemic vascular resistance, decreased response to diuretics   Diabetes Medications Metformin Thiazolidinediones Rosiglitazone (Avandia) Pioglitazone (Actos) DPP4 Inhibitors Saxagliptin (Onglyza) Sitagliptin (Januvia)   Lactic acidosis Possible calcium channel blockade   Unknown  Antiarrhythmics Class I  Flecainide Disopyramide Class III Sotalol Other Dronedarone  Negative inotrope, proarrhythmic   Proarrhythmic, beta blockade  Negative inotrope  Antihypertensives Alpha Blockers Doxazosin Calcium Channel Blockers Diltiazem Verapamil Nifedipine Central Alpha Adrenergics Moxonidine Peripheral Vasodilators Minoxidil  Increases renin and aldosterone  Negative inotrope    Possible sympathetic withdrawal  Unknown  Anti-infective Itraconazole Amphotericin B  Negative inotrope Unknown  Hematologic Anagrelide Cilostazol   Possible inhibition of PD IV Inhibition of PD III causing arrhythmias   Neurologic/Psychiatric Stimulants Anti-Seizure Drugs Carbamazepine Pregabalin Antidepressants Tricyclics Citalopram Parkinsons Bromocriptine Pergolide Pramipexole Antipsychotics Clozapine Antimigraine Ergotamine Methysergide Appetite suppressants Bipolar Lithium  Peripheral alpha and beta agonist activity  Negative inotrope and chronotrope Calcium channel blockade  Negative inotrope, proarrhythmic Dose-dependent QT prolongation  Excessive serotonin activity/valvular damage Excessive serotonin activity/valvular damage Unknown  IgE mediated hypersensitivy, calcium channel blockade  Excessive serotonin activity/valvular damage Excessive serotonin activity/valvular damage Valvular damage  Direct myofibrillar degeneration, adrenergic stimulation  Antimalarials Chloroquine Hydroxychloroquine Intracellular inhibition of lysosomal enzymes  Urologic Agents Alpha Blockers Doxazosin Prazosin Tamsulosin Terazosin  Increased renin and aldosterone  Adapted from Page Carleene Overlie, et al. "Drugs That May Cause or Exacerbate Heart Failure: A Scientific Statement from the American Heart  Association." Circulation 2016; 134:e32-e69. DOI: 10.1161/CIR.0000000000000426   MEDICATION ADHERENCES TIPS AND STRATEGIES Taking medication as prescribed improves patient outcomes in heart failure (  reduces hospitalizations, improves symptoms, increases survival) Side effects of medications can be managed by decreasing doses, switching agents, stopping drugs, or adding additional therapy. Please let someone in the Heart Failure Clinic know if you have having bothersome side effects so we can modify your regimen. Do not alter your medication regimen without talking to [place].  Medication reminders can help patients remember to take drugs on time. If you are missing or forgetting doses you can try linking behaviors, using pill boxes, or an electronic reminder like an alarm on your phone or an app. Some people  can also get automated phone calls as medication reminders.

## 2022-10-22 NOTE — Patient Instructions (Addendum)
Continue weighing daily and call for an overnight weight gain of 3 pounds or more or a weekly weight gain of more than 5 pounds.   If you have voicemail, please make sure your mailbox is cleaned out so that we may leave a message and please make sure to listen to any voicemails.    Alcohol use could potentially make your symptoms worse with your atrial fibrillation.

## 2022-11-01 NOTE — CV Procedure (Signed)
Electrical Cardioversion Procedure Note   Procedure: Electrical Cardioversion Indications:  Atrial Fibrillation  Procedure Details Consent: Risks of procedure as well as the alternatives and risks of each were explained to the (patient/caregiver).  Consent for procedure obtained. Time Out: Verified patient identification, verified procedure, site/side was marked, verified correct patient position, special equipment/implants available, medications/allergies/relevent history reviewed, required imaging and test results available.  Performed  Patient placed on cardiac monitor, pulse oximetry, supplemental oxygen as necessary.  Sedation given:  Propofol as per anesthesia Pacer pads placed anterior and posterior chest.  Cardioverted 4 time(s).  Cardioverted at unsuccessful.  Evaluation Findings: Post procedure EKG shows: Atrial Fibrillation Complications: None Patient did tolerate procedure well.   Lujean Amel MD 10/21/2022

## 2022-11-04 DIAGNOSIS — E042 Nontoxic multinodular goiter: Secondary | ICD-10-CM | POA: Diagnosis not present

## 2022-11-04 DIAGNOSIS — I10 Essential (primary) hypertension: Secondary | ICD-10-CM | POA: Diagnosis not present

## 2022-11-04 DIAGNOSIS — E039 Hypothyroidism, unspecified: Secondary | ICD-10-CM | POA: Diagnosis not present

## 2022-11-04 DIAGNOSIS — E063 Autoimmune thyroiditis: Secondary | ICD-10-CM | POA: Diagnosis not present

## 2022-11-12 DIAGNOSIS — R600 Localized edema: Secondary | ICD-10-CM | POA: Diagnosis not present

## 2022-11-12 DIAGNOSIS — E669 Obesity, unspecified: Secondary | ICD-10-CM | POA: Diagnosis not present

## 2022-11-12 DIAGNOSIS — I480 Paroxysmal atrial fibrillation: Secondary | ICD-10-CM | POA: Diagnosis not present

## 2022-11-12 DIAGNOSIS — Z9289 Personal history of other medical treatment: Secondary | ICD-10-CM | POA: Diagnosis not present

## 2022-11-12 DIAGNOSIS — I509 Heart failure, unspecified: Secondary | ICD-10-CM | POA: Diagnosis not present

## 2022-11-12 DIAGNOSIS — I10 Essential (primary) hypertension: Secondary | ICD-10-CM | POA: Diagnosis not present

## 2022-11-12 DIAGNOSIS — E785 Hyperlipidemia, unspecified: Secondary | ICD-10-CM | POA: Diagnosis not present

## 2022-11-12 DIAGNOSIS — M0609 Rheumatoid arthritis without rheumatoid factor, multiple sites: Secondary | ICD-10-CM | POA: Diagnosis not present

## 2022-11-12 DIAGNOSIS — R0602 Shortness of breath: Secondary | ICD-10-CM | POA: Diagnosis not present

## 2022-11-12 DIAGNOSIS — I4891 Unspecified atrial fibrillation: Secondary | ICD-10-CM | POA: Diagnosis not present

## 2022-11-16 ENCOUNTER — Telehealth: Payer: Self-pay | Admitting: Family

## 2022-11-16 NOTE — Telephone Encounter (Signed)
Patient called to give update on health. She says that she's been noticing some blurry vision. Her amiodarone had previously been stopped and she says that her thyroid levels were "through the roof" and are slowly coming down. Has upcoming PCP appt as well as EP appt Marcello Moores) on 11/27/22.   Just wanted to give the update and keep [place] informed. Thanked her for the update.

## 2022-11-17 DIAGNOSIS — I480 Paroxysmal atrial fibrillation: Secondary | ICD-10-CM | POA: Diagnosis not present

## 2022-11-17 DIAGNOSIS — G4489 Other headache syndrome: Secondary | ICD-10-CM | POA: Diagnosis not present

## 2022-11-17 DIAGNOSIS — Z0130 Encounter for examination of blood pressure without abnormal findings: Secondary | ICD-10-CM | POA: Diagnosis not present

## 2022-11-17 DIAGNOSIS — R7309 Other abnormal glucose: Secondary | ICD-10-CM | POA: Diagnosis not present

## 2022-11-17 DIAGNOSIS — M79672 Pain in left foot: Secondary | ICD-10-CM | POA: Diagnosis not present

## 2022-11-17 DIAGNOSIS — Z0131 Encounter for examination of blood pressure with abnormal findings: Secondary | ICD-10-CM | POA: Diagnosis not present

## 2022-11-17 DIAGNOSIS — Z1389 Encounter for screening for other disorder: Secondary | ICD-10-CM | POA: Diagnosis not present

## 2022-11-17 DIAGNOSIS — E039 Hypothyroidism, unspecified: Secondary | ICD-10-CM | POA: Diagnosis not present

## 2022-11-19 ENCOUNTER — Encounter (HOSPITAL_COMMUNITY): Payer: Self-pay | Admitting: *Deleted

## 2022-11-27 DIAGNOSIS — Z23 Encounter for immunization: Secondary | ICD-10-CM | POA: Diagnosis not present

## 2022-11-27 NOTE — Progress Notes (Addendum)
Referring Physician:  Freddy Finner, NP [address] [place],  [place] [address]  Primary Physician:  Freddy Finner, NP  History of Present Illness: 12/02/2022 Alexandria Moore has a history of RA, HTN, afib, thyroid disease, GERD, sleep apnea, and chronic heart failure.   She sees rheumatology and is on methotrexate.   She had ACDF C4-C5 by Dr. Izora Ribas for cervical myelopathy on 01/18/20. He last saw her on 10/03/20. Looks like she had some persistent right sided weakness.   Saw neurology back in 2022 for numbness/pain in right hand. She had EMG on 11/19/21 that showed mild right carpal tunnel syndrome. She had right carpal tunnel injection on 11/26/21.   She has intermittent neck pain that radiates into both shoulders x 3-4 weeks. No arm pain. Pain also can radiate into back of her head and sometimes around front of her head- feels like a headache. She feels like a weight is on her shoulders. She has some numbness and tingling in right hand. Still with some weakness in right arm since surgery. No change in balance.   Neck pain is feeling better over last few days.   She is on ELIQUIS.   Conservative measures:  Physical therapy: none recent Multimodal medical therapy including regular antiinflammatories: neurontin  Injections: No recent epidural steroid injections  Past Surgery:  ACDF C4-C5 by Dr. Izora Ribas for cervical myelopathy on 01/18/20  Dimas Chyle has no symptoms of cervical myelopathy. Has persistent weakness right arm since surgery. No new balance issues.   The symptoms are causing a significant impact on the patient's life.   Review of Systems:  A 10 point review of systems is negative, except for the pertinent positives and negatives detailed in the HPI.  Past Medical History: Past Medical History:  Diagnosis Date   Anxiety    Arrhythmia    PAF   Arthritis    RA   Borderline diabetes    CHF (congestive heart failure) ([place])    COVID-19  11/2019   Dyspnea    OFF AND ON SINCE COVID 2021   GERD (gastroesophageal reflux disease)    Goiter    Heart murmur    not being followed or treated. has lost weight with improvement of murmur   History of blood transfusion    unsure of when she had transfusion but states she had a terrible reaction and needed medication   Hypertension    Hyperthyroidism    Hypothyroidism    not on meds at this time   Medical history non-contributory    Pneumonia 11/2019   Rheumatoid arthritis ([place])    Sleep apnea    does not use cpap since weight loss    Past Surgical History: Past Surgical History:  Procedure Laterality Date   ANTERIOR CERVICAL DECOMP/DISCECTOMY FUSION N/A 01/18/2020   Procedure: ANTERIOR CERVICAL DECOMPRESSION/DISCECTOMY FUSION 1 LEVEL;  Surgeon: Meade Maw, MD;  Location: [facility] ORS;  Service: Neurosurgery;  Laterality: N/A;  C4-5   CARDIOVERSION N/A 08/13/2022   Procedure: CARDIOVERSION;  Surgeon: Yolonda Kida, MD;  Location: [facility] ORS;  Service: Cardiovascular;  Laterality: N/A;   CARDIOVERSION N/A 10/21/2022   Procedure: CARDIOVERSION;  Surgeon: Yolonda Kida, MD;  Location: [facility] ORS;  Service: Cardiovascular;  Laterality: N/A;   CESAREAN SECTION  1982   x 1   COLONOSCOPY WITH PROPOFOL N/A 03/12/2022   Procedure: COLONOSCOPY WITH PROPOFOL;  Surgeon: Lin Landsman, MD;  Location: [facility];  Service: Gastroenterology;  Laterality: N/A;  TOTAL HIP ARTHROPLASTY Left 05/12/2018   Procedure: TOTAL HIP ARTHROPLASTY ANTERIOR APPROACH;  Surgeon: Hessie Knows, MD;  Location: [facility] ORS;  Service: Orthopedics;  Laterality: Left;   TOTAL KNEE ARTHROPLASTY Right 01/28/2021   Procedure: TOTAL KNEE ARTHROPLASTY;  Surgeon: Hessie Knows, MD;  Location: [facility] OR[facility];  Service: Orthopedics;  Laterality: Right;    Allergies: Allergies as of 12/02/2022 - Review Complete 10/22/2022  Allergen Reaction Noted   Hydromorphone Anaphylaxis and Shortness Of Breath 06/03/2016    Lisinopril Anaphylaxis, Nausea And Vomiting, Swelling, and Other (See Comments) 06/03/2016   Meloxicam Anaphylaxis and Other (See Comments) 06/03/2016   Adhesive [tape] Other (See Comments) 05/04/2018    Medications: Outpatient Encounter Medications as of 12/02/2022  Medication Sig   apixaban (ELIQUIS) 5 MG TABS tablet Take 1 tablet (5 mg total) by mouth 2 (two) times daily.   cholecalciferol (VITAMIN D) 25 MCG (1000 UNIT) tablet Take 2,000 Units by mouth in the morning.   diltiazem (CARDIZEM) 30 MG tablet Take 1 tablet (30 mg total) by mouth every 6 (six) hours as needed (for sustained HR >130 for more than 15 mins. do not take if systolic blood pressure (top number) is lower than 100.).   empagliflozin (JARDIANCE) 10 MG TABS tablet Take 1 tablet (10 mg total) by mouth daily before breakfast.   folic acid (FOLVITE) 1 MG tablet Take 1 mg by mouth in the morning.   furosemide (LASIX) 20 MG tablet Take 20 mg by mouth daily.   gabapentin (NEURONTIN) 300 MG capsule Take 300 mg by mouth 3 (three) times daily as needed (nerve pain/nerve damage).   methotrexate (RHEUMATREX) 2.5 MG tablet Take 20 mg by mouth every Sunday.   metoprolol tartrate (LOPRESSOR) 100 MG tablet Take 1 tablet (100 mg total) by mouth 2 (two) times daily. (Patient taking differently: Take 50 mg by mouth 2 (two) times daily.)   polyethylene glycol (MIRALAX / GLYCOLAX) 17 g packet Take 17 g by mouth daily as needed for moderate constipation.   spironolactone (ALDACTONE) 25 MG tablet Take 0.5 tablets (12.5 mg total) by mouth daily.   SYNTHROID 88 MCG tablet Take 88 mcg by mouth daily before breakfast.   No facility-administered encounter medications on file as of 12/02/2022.    Social History: Social History   Tobacco Use   Smoking status: Former    Packs/day: 0.25    Types: Cigarettes    Quit date: 2014    Years since quitting: 10.1   Smokeless tobacco: Never   Tobacco comments:    SMOKED LESS THAN A YEAR   Vaping Use    Vaping Use: Never used  Substance Use Topics   Alcohol use: Yes    Comment: OCC WINE   Drug use: Never    Family Medical History: Family History  Problem Relation Age of Onset   Breast cancer Neg Hx     Physical Examination: There were no vitals filed for this visit.  General: Patient is well developed, well nourished, calm, collected, and in no apparent distress. Attention to examination is appropriate.  Respiratory: Patient is breathing without any difficulty.   NEUROLOGICAL:     Awake, alert, oriented to person, place, and time.  Speech is clear and fluent. Fund of knowledge is appropriate.   Cranial Nerves: Pupils equal round and reactive to light.  Facial tone is symmetric.    No posterior cervical tenderness. Mild tenderness in bilateral trapezial region.   Well healed cervical incision  No abnormal lesions on exposed skin.  Strength: Side Biceps Triceps Deltoid Interossei Grip Wrist Ext. Wrist Flex.  R 5 4+ '5 5 5 5 5  '$ L '5 5 5 5 5 5 5   '$ Side Iliopsoas Quads Hamstring PF DF EHL  R '5 5 5 5 5 5  '$ L '5 5 5 5 5 5   '$ Reflexes are 2+ and symmetric at the biceps, triceps, brachioradialis, patella and achilles.   Hoffman's is positive.  Clonus is not present.   Bilateral upper and lower extremity sensation is intact to light touch.     Gait is normal.    Medical Decision Making  Imaging: MRI of cervical spine dated 10/15/21:  FINDINGS: Alignment: Mild reversal of the normal cervical lordosis, C3-C5. No significant listhesis.   Vertebrae: No acute fracture or suspicious osseous lesion. Status post ACDF C4-C5.   Cord: Redemonstrated focal T2 hyperintense signal in the right dorsal lateral aspect of the spinal cord (series 8, image 11-12), which appears slightly more well-defined compared to the prior exam, with a suggestion of mild volume loss in this area. Spinal cord is otherwise normal in caliber and signal.   Posterior Fossa, vertebral arteries, paraspinal  tissues: Negative.   Disc levels:   C2-C3: No significant disc bulge. Mild left-greater-than-right facet arthropathy. No spinal canal stenosis or neural foraminal narrowing.   C3-C4: Left-greater-than-right disc osteophyte complex. Facet arthropathy. Mild spinal canal stenosis, unchanged. Moderate to severe left neural foraminal narrowing, unchanged.   C4-C5: Status post fusion. No spinal canal stenosis. Uncovertebral and facet arthropathy. Mild left neural foraminal narrowing, unchanged.   C5-C6: Moderate disc bulge. Uncovertebral and facet arthropathy. Mild spinal canal stenosis, unchanged. Moderate left neural foraminal narrowing, unchanged.   C6-C7: Moderate disc bulge. Uncovertebral and facet arthropathy. Mild spinal canal stenosis, unchanged. Mild bilateral neural foraminal narrowing, unchanged.   C7-T1: No significant disc bulge. No spinal canal stenosis or neuroforaminal narrowing.   IMPRESSION: 1. Redemonstrated T2 hyperintense signal in the spinal cord on the right at C4-C5, which may also demonstrate some mild volume loss, consistent with chronic myelomalacia. 2. Mild spinal canal stenosis at C3-C4, C5-C6, and C6-C7. 3. Multilevel uncovertebral and facet arthropathy, which causes moderate to severe neural foraminal narrowing on the left at C3-C4 and moderate left neural foraminal narrowing at C5-C6. Additional, milder neural foraminal narrowing is described above.     Electronically Signed   By: Merilyn Baba M.D.   On: 10/15/2021 13:35  I have personally reviewed the images and agree with the above interpretation.  Assessment and Plan: Ms. Pereda is a pleasant 62 y.o. female has intermittent neck pain that radiates into both shoulders x 3-4 weeks. No arm pain. Pain also can radiate into back of her head and sometimes around front of her head- feels like a headache and like a weight is on her shoulders. Still with some weakness in right arm since surgery. No  change in balance.  MRI from January of 2023 showed chronic myelomalacia at C4-C5, mild spinal stenosis C3-C4 and C5-C7, left foraminal stenosis C3-C4 and C5-C6. Current pain appears more myofascial in nature. Not sure if headaches are related to cervical spine.   Treatment options discussed with patient and following plan made:   - Xrays of cervical spine. Will call her with results.  - Discussed PT for cervical spine. She declines for now.  - Referral back to neurology (has seen Manuella Ghazi previously as above) for evaluation of headaches.  - MRI shows chronic myelomalacia- still still some weakness in right arm and  has positive hoffmans on exam. No changes in balance or dexterity. Will follow.  - Follow up with me in 6 weeks for phone visit.   I spent a total of 20 minutes in face-to-face and non-face-to-face activities related to this patient's care today including review of outside records, review of imaging, review of symptoms, physical exam, discussion of differential diagnosis, discussion of treatment options, and documentation.   Thank you for involving me in the care of this patient.   ADDENDUM 12/04/22:  Cervical xrays dated 12/02/22:  FINDINGS: Anterior cervical spinal fusion C4-5. Small amount of nonspecific perihardware lucency involving the C5 vertebral body screw. Grade 1 anterolisthesis of C2 on C3. Multilevel degenerative disc disease and facet disease throughout the visualized cervical spine. No evidence for acute osseous abnormality. Lateral masses articulate appropriately with the dens. Lung apices are clear.   IMPRESSION: 1. Anterior cervical spinal fusion C4-5. Small amount of nonspecific perihardware lucency involving the C5 vertebral body screw. 2. Multilevel degenerative disc and facet disease.     Electronically Signed   By: Lovey Newcomer M.D.   On: 12/04/2022 06:45  I have personally reviewed the images and agree with the above interpretation.    Cervical xrays  reviewed with Dr. Izora Ribas. Fusion is solid. Nothing further needs to be done for possible lucency around C5 screw.   Patient called with xray results. No change in above plan.   Geronimo Boot PA-C Dept. of Neurosurgery

## 2022-12-02 ENCOUNTER — Ambulatory Visit
Admission: RE | Admit: 2022-12-02 | Discharge: 2022-12-02 | Disposition: A | Discharge status: Home / Self Care | Payer: 59 | Source: Ambulatory Visit | Attending: Orthopedic Surgery | Admitting: Orthopedic Surgery

## 2022-12-02 ENCOUNTER — Ambulatory Visit: Payer: 59 | Admitting: Orthopedic Surgery

## 2022-12-02 ENCOUNTER — Encounter: Payer: Self-pay | Admitting: Orthopedic Surgery

## 2022-12-02 ENCOUNTER — Ambulatory Visit
Admission: RE | Admit: 2022-12-02 | Discharge: 2022-12-02 | Disposition: A | Discharge status: Home / Self Care | Payer: 59 | Attending: Orthopedic Surgery | Admitting: Orthopedic Surgery

## 2022-12-02 VITALS — BP 122/78 | Ht 64.0 in | Wt 231.0 lb

## 2022-12-02 DIAGNOSIS — M542 Cervicalgia: Secondary | ICD-10-CM | POA: Diagnosis not present

## 2022-12-02 DIAGNOSIS — Z981 Arthrodesis status: Secondary | ICD-10-CM | POA: Insufficient documentation

## 2022-12-02 DIAGNOSIS — M4312 Spondylolisthesis, cervical region: Secondary | ICD-10-CM | POA: Diagnosis not present

## 2022-12-02 NOTE — Patient Instructions (Signed)
It was so nice to see you today. Thank you so much for coming in.    MRI of your neck from last year showed some wear and tear (arthritis).   I ordered xrays of your neck. You can go across the street to get these at [place] (building with the white pillars). You do not need any appointment. I will call you with results.   I put in a referral to see neurology at [facility] for your headaches (Dr. Manuella Ghazi). They should call you or you can call them at [phone].   If you want to start physical therapy for your neck, then call me so I can order it.   You are scheduled for a phone visit in 6 weeks,   Please do not hesitate to call if you have any questions or concerns. You can also message me in [place].   Geronimo Boot PA-C [phone]

## 2022-12-10 DIAGNOSIS — Z96651 Presence of right artificial knee joint: Secondary | ICD-10-CM | POA: Diagnosis not present

## 2022-12-10 DIAGNOSIS — Z7901 Long term (current) use of anticoagulants: Secondary | ICD-10-CM | POA: Diagnosis not present

## 2022-12-10 DIAGNOSIS — Z01810 Encounter for preprocedural cardiovascular examination: Secondary | ICD-10-CM | POA: Diagnosis not present

## 2022-12-10 DIAGNOSIS — Z8679 Personal history of other diseases of the circulatory system: Secondary | ICD-10-CM | POA: Diagnosis not present

## 2022-12-10 DIAGNOSIS — I480 Paroxysmal atrial fibrillation: Secondary | ICD-10-CM | POA: Diagnosis not present

## 2022-12-10 DIAGNOSIS — Z6841 Body Mass Index (BMI) 40.0 and over, adult: Secondary | ICD-10-CM | POA: Diagnosis not present

## 2022-12-10 DIAGNOSIS — I10 Essential (primary) hypertension: Secondary | ICD-10-CM | POA: Diagnosis not present

## 2022-12-10 DIAGNOSIS — Z96642 Presence of left artificial hip joint: Secondary | ICD-10-CM | POA: Diagnosis not present

## 2022-12-11 DIAGNOSIS — I4819 Other persistent atrial fibrillation: Secondary | ICD-10-CM | POA: Diagnosis not present

## 2022-12-11 DIAGNOSIS — M0609 Rheumatoid arthritis without rheumatoid factor, multiple sites: Secondary | ICD-10-CM | POA: Diagnosis not present

## 2022-12-11 DIAGNOSIS — K219 Gastro-esophageal reflux disease without esophagitis: Secondary | ICD-10-CM | POA: Diagnosis not present

## 2022-12-11 DIAGNOSIS — Z01818 Encounter for other preprocedural examination: Secondary | ICD-10-CM | POA: Diagnosis not present

## 2022-12-11 DIAGNOSIS — E039 Hypothyroidism, unspecified: Secondary | ICD-10-CM | POA: Diagnosis not present

## 2022-12-11 DIAGNOSIS — I10 Essential (primary) hypertension: Secondary | ICD-10-CM | POA: Diagnosis not present

## 2022-12-16 DIAGNOSIS — Z01818 Encounter for other preprocedural examination: Secondary | ICD-10-CM | POA: Diagnosis not present

## 2022-12-16 DIAGNOSIS — G4733 Obstructive sleep apnea (adult) (pediatric): Secondary | ICD-10-CM | POA: Diagnosis not present

## 2022-12-16 DIAGNOSIS — M0579 Rheumatoid arthritis with rheumatoid factor of multiple sites without organ or systems involvement: Secondary | ICD-10-CM | POA: Diagnosis not present

## 2022-12-16 DIAGNOSIS — I5020 Unspecified systolic (congestive) heart failure: Secondary | ICD-10-CM | POA: Diagnosis not present

## 2022-12-16 DIAGNOSIS — I4891 Unspecified atrial fibrillation: Secondary | ICD-10-CM | POA: Diagnosis not present

## 2022-12-16 DIAGNOSIS — K219 Gastro-esophageal reflux disease without esophagitis: Secondary | ICD-10-CM | POA: Diagnosis not present

## 2022-12-16 DIAGNOSIS — Z9989 Dependence on other enabling machines and devices: Secondary | ICD-10-CM | POA: Diagnosis not present

## 2022-12-16 DIAGNOSIS — E039 Hypothyroidism, unspecified: Secondary | ICD-10-CM | POA: Diagnosis not present

## 2022-12-16 DIAGNOSIS — I4811 Longstanding persistent atrial fibrillation: Secondary | ICD-10-CM | POA: Diagnosis not present

## 2022-12-16 DIAGNOSIS — I110 Hypertensive heart disease with heart failure: Secondary | ICD-10-CM | POA: Diagnosis not present

## 2022-12-17 DIAGNOSIS — I4819 Other persistent atrial fibrillation: Secondary | ICD-10-CM | POA: Diagnosis not present

## 2022-12-28 DIAGNOSIS — M170 Bilateral primary osteoarthritis of knee: Secondary | ICD-10-CM | POA: Diagnosis not present

## 2022-12-28 DIAGNOSIS — M0609 Rheumatoid arthritis without rheumatoid factor, multiple sites: Secondary | ICD-10-CM | POA: Diagnosis not present

## 2022-12-28 DIAGNOSIS — Z79899 Other long term (current) drug therapy: Secondary | ICD-10-CM | POA: Diagnosis not present

## 2022-12-29 DIAGNOSIS — I10 Essential (primary) hypertension: Secondary | ICD-10-CM | POA: Diagnosis not present

## 2022-12-29 DIAGNOSIS — M069 Rheumatoid arthritis, unspecified: Secondary | ICD-10-CM | POA: Diagnosis not present

## 2022-12-29 DIAGNOSIS — E039 Hypothyroidism, unspecified: Secondary | ICD-10-CM | POA: Diagnosis not present

## 2022-12-29 DIAGNOSIS — I509 Heart failure, unspecified: Secondary | ICD-10-CM | POA: Diagnosis not present

## 2022-12-29 DIAGNOSIS — E042 Nontoxic multinodular goiter: Secondary | ICD-10-CM | POA: Diagnosis not present

## 2022-12-29 DIAGNOSIS — I4891 Unspecified atrial fibrillation: Secondary | ICD-10-CM | POA: Diagnosis not present

## 2022-12-29 DIAGNOSIS — E063 Autoimmune thyroiditis: Secondary | ICD-10-CM | POA: Diagnosis not present

## 2023-01-07 NOTE — Progress Notes (Signed)
   Telephone Visit- Progress Note: Referring Physician:  Freddy Finner, NP [address] [place],  [place] [address]  Primary Physician:  Freddy Finner, NP  This visit was performed via telephone.  Patient location: home Provider location: office  I spent a total of 5 minutes non-face-to-face activities for this visit on the date of this encounter including review of current clinical condition and response to treatment.    Patient has given verbal consent to this telephone visits and we reviewed the limitations of a telephone visit. Patient wishes to proceed.    Chief Complaint:  recheck visit  History of Present Illness: Alexandria Moore is a 62 y.o. female has a history of RA, HTN, afib, thyroid disease, GERD, sleep apnea, and chronic heart failure.    She sees rheumatology and is on methotrexate.    She had ACDF C4-C5 by Dr. Izora Ribas for cervical myelopathy on 01/18/20. He last saw her on 10/03/20. Looks like she had some persistent right sided weakness.    Last seen by me on 12/02/22 for intermittent neck pain that radiates into both shoulders. No arm pain. Updated cervical xrays looked okay. She has known multilevel DDD/spondylosis.   We discussed PT and she declined. She was sent back to neurology Manuella Ghazi) for her headaches (has not seen him yet).   Phone visit scheduled for follow up.   She is doing great! She has no neck pain or headaches- they have both resolved. She has her mild baseline stiffness in the neck, but overall feels good. She had some persistent weakness in right arm since surgery and this is the same. No new balance issues.   Exam: No exam done as this was a telephone encounter.     Imaging: none  I have personally reviewed the images and agree with the above interpretation.  Assessment and Plan: [name] is a pleasant 62 y.o. female with improvement since last visit. She has no neck pain or headaches! No change in balance. She has  persistent right arm weakness since surgery.   MRI from January of 2023 showed chronic myelomalacia at C4-C5, mild spinal stenosis C3-C4 and C5-C7, left foraminal stenosis C3-C4 and C5-C6. Recent cervical xrays showed multilevel DDD/spondylosis.   Treatment options discussed with patient and following plan made:   - Continue to follow up with neurology regarding headaches.  - Follow up with [place] as needed.   Geronimo Boot PA-C Neurosurgery

## 2023-01-08 ENCOUNTER — Encounter: Payer: Self-pay | Admitting: Orthopedic Surgery

## 2023-01-08 ENCOUNTER — Ambulatory Visit (INDEPENDENT_AMBULATORY_CARE_PROVIDER_SITE_OTHER): Payer: 59 | Admitting: Orthopedic Surgery

## 2023-01-08 DIAGNOSIS — M47812 Spondylosis without myelopathy or radiculopathy, cervical region: Secondary | ICD-10-CM | POA: Diagnosis not present

## 2023-01-08 DIAGNOSIS — Z981 Arthrodesis status: Secondary | ICD-10-CM

## 2023-01-18 DIAGNOSIS — G4733 Obstructive sleep apnea (adult) (pediatric): Secondary | ICD-10-CM | POA: Diagnosis not present

## 2023-01-21 ENCOUNTER — Ambulatory Visit: Payer: 59 | Attending: Family | Admitting: Family

## 2023-01-21 ENCOUNTER — Encounter: Payer: Self-pay | Admitting: Family

## 2023-01-21 VITALS — BP 135/79 | HR 55 | Wt 230.0 lb

## 2023-01-21 DIAGNOSIS — X58XXXA Exposure to other specified factors, initial encounter: Secondary | ICD-10-CM | POA: Diagnosis not present

## 2023-01-21 DIAGNOSIS — E119 Type 2 diabetes mellitus without complications: Secondary | ICD-10-CM | POA: Insufficient documentation

## 2023-01-21 DIAGNOSIS — K219 Gastro-esophageal reflux disease without esophagitis: Secondary | ICD-10-CM | POA: Insufficient documentation

## 2023-01-21 DIAGNOSIS — I509 Heart failure, unspecified: Secondary | ICD-10-CM | POA: Diagnosis not present

## 2023-01-21 DIAGNOSIS — T783XXA Angioneurotic edema, initial encounter: Secondary | ICD-10-CM | POA: Diagnosis not present

## 2023-01-21 DIAGNOSIS — G4730 Sleep apnea, unspecified: Secondary | ICD-10-CM | POA: Diagnosis not present

## 2023-01-21 DIAGNOSIS — I110 Hypertensive heart disease with heart failure: Secondary | ICD-10-CM | POA: Insufficient documentation

## 2023-01-21 DIAGNOSIS — I5022 Chronic systolic (congestive) heart failure: Secondary | ICD-10-CM | POA: Diagnosis not present

## 2023-01-21 DIAGNOSIS — G4733 Obstructive sleep apnea (adult) (pediatric): Secondary | ICD-10-CM

## 2023-01-21 DIAGNOSIS — Z7901 Long term (current) use of anticoagulants: Secondary | ICD-10-CM | POA: Diagnosis not present

## 2023-01-21 DIAGNOSIS — I10 Essential (primary) hypertension: Secondary | ICD-10-CM | POA: Diagnosis not present

## 2023-01-21 DIAGNOSIS — I480 Paroxysmal atrial fibrillation: Secondary | ICD-10-CM | POA: Diagnosis not present

## 2023-01-21 NOTE — Progress Notes (Signed)
Patient ID: Alexandria Moore, female    DOB: 18-May-1961, 62 y.o.   MRN: [id number]  Primary cardiologist: Dorothyann Peng, MD (last seen 02/24; returns 08/24) PCP: Sandrea Hughs, NP (last seen 03/24  HPI  Alexandria Moore is a 62 y/o female with a history of HTN, PAF, thyroid disease, anxiety, GERD, sleep apnea, tobacco use and chronic heart failure.   ECHO 06/09/22 Left ventricular ejection fraction, by estimation, is 40 to 45%  Has not been admitted or been in the ED in the last 6 months.   Cardioversion done 10/21/22 but she remained in AF. Ablation done 12/16/22  She presents for a HF follow-up today with a chief complaint of minimal fatigue with moderate exertion. Chronic in nature. Has SOB, pedal edema (left ankle), occasional palpitations and chronic knee pain along with this. Denies difficulty sleeping, abdominal distention, chest pain, dizziness, cough or headaches.   Has had her ablation done since last here and reports feeling better. Has slowly increased how many steps she walks and started at 500 steps daily and is now walking 3000 steps daily.   Past Medical History:  Diagnosis Date   Anxiety    Arrhythmia    PAF   Arthritis    RA   Borderline diabetes    CHF (congestive heart failure) (HCC)    COVID-19 11/2019   Dyspnea    OFF AND ON SINCE COVID 2021   GERD (gastroesophageal reflux disease)    Goiter    Heart murmur    not being followed or treated. has lost weight with improvement of murmur   History of blood transfusion    unsure of when she had transfusion but states she had a terrible reaction and needed medication   Hypertension    Hyperthyroidism    Hypothyroidism    not on meds at this time   Medical history non-contributory    Pneumonia 11/2019   Rheumatoid arthritis (HCC)    Sleep apnea    does not use cpap since weight loss   Past Surgical History:  Procedure Laterality Date   ANTERIOR CERVICAL DECOMP/DISCECTOMY FUSION N/A 01/18/2020   Procedure:  ANTERIOR CERVICAL DECOMPRESSION/DISCECTOMY FUSION 1 LEVEL;  Surgeon: Venetia Night, MD;  Location: [facility] OR[facility];  Service: Neurosurgery;  Laterality: N/A;  C4-5   CARDIOVERSION N/A 08/13/2022   Procedure: CARDIOVERSION;  Surgeon: Alwyn Pea, MD;  Location: [facility];  Service: Cardiovascular;  Laterality: N/A;   CARDIOVERSION N/A 10/21/2022   Procedure: CARDIOVERSION;  Surgeon: Alwyn Pea, MD;  Location: [facility];  Service: Cardiovascular;  Laterality: N/A;   CESAREAN SECTION  1982   x 1   COLONOSCOPY WITH PROPOFOL N/A 03/12/2022   Procedure: COLONOSCOPY WITH PROPOFOL;  Surgeon: Toney Reil, MD;  Location: [facility];  Service: Gastroenterology;  Laterality: N/A;   TOTAL HIP ARTHROPLASTY Left 05/12/2018   Procedure: TOTAL HIP ARTHROPLASTY ANTERIOR APPROACH;  Surgeon: Kennedy Bucker, MD;  Location: [facility];  Service: Orthopedics;  Laterality: Left;   TOTAL KNEE ARTHROPLASTY Right 01/28/2021   Procedure: TOTAL KNEE ARTHROPLASTY;  Surgeon: Kennedy Bucker, MD;  Location: [facility];  Service: Orthopedics;  Laterality: Right;   Family History  Problem Relation Age of Onset   Breast cancer Neg Hx    Social History   Tobacco Use   Smoking status: Former    Packs/day: .25    Types: Cigarettes    Quit date: 2014    Years since quitting: 10.2   Smokeless tobacco: Never   Tobacco  comments:    SMOKED LESS THAN A YEAR   Substance Use Topics   Alcohol use: Yes    Comment: OCC WINE   Allergies  Allergen Reactions   Hydromorphone Anaphylaxis and Shortness Of Breath   Lisinopril Anaphylaxis, Nausea And Vomiting, Swelling and Other (See Comments)    LIP SWELLING   Meloxicam Anaphylaxis and Other (See Comments)    Weakness in Legs Other reaction(s): Other (See Comments) Weakness in Legs Weakness in Legs    Adhesive [Tape] Other (See Comments)    Irritates skin. (band-aids) Paper tape is okay   Prior to Admission medications   Medication Sig Start Date End Date Taking?  Authorizing Provider  amoxicillin-clavulanate (AUGMENTIN) 875-125 MG tablet Take 1 tablet by mouth 2 (two) times daily. 01/19/23 01/26/23 Yes [provider]  apixaban (ELIQUIS) 5 MG TABS tablet Take 1 tablet (5 mg total) by mouth 2 (two) times daily. 06/12/22  Yes Wieting, Richard, MD  cholecalciferol (VITAMIN D) 25 MCG (1000 UNIT) tablet Take 2,000 Units by mouth in the morning.   Yes [provider]  diltiazem (CARDIZEM) 30 MG tablet Take 1 tablet (30 mg total) by mouth every 6 (six) hours as needed (for sustained HR >130 for more than 15 mins. do not take if systolic blood pressure (top number) is lower than 100.). 06/12/22  Yes Alford Highland, MD  empagliflozin (JARDIANCE) 10 MG TABS tablet Take 1 tablet (10 mg total) by mouth daily before breakfast. 08/11/22  Yes Clarisa Kindred A, FNP  flecainide (TAMBOCOR) 100 MG tablet Take 100 mg by mouth 2 (two) times daily.   Yes [provider]  folic acid (FOLVITE) 1 MG tablet Take 1 mg by mouth in the morning. 01/01/21  Yes [provider]  furosemide (LASIX) 20 MG tablet Take 20 mg by mouth daily.   Yes [provider]  gabapentin (NEURONTIN) 300 MG capsule Take 300 mg by mouth at bedtime. 11/06/19  Yes [provider]  methotrexate (RHEUMATREX) 2.5 MG tablet Take 20 mg by mouth every Sunday. 01/02/21  Yes [provider]  metoprolol tartrate (LOPRESSOR) 100 MG tablet Take 1 tablet (100 mg total) by mouth 2 (two) times daily. Patient taking differently: Take 50 mg by mouth 2 (two) times daily. 06/12/22  Yes Wieting, Richard, MD  omeprazole (PRILOSEC) 20 MG capsule Take 20 mg by mouth daily as needed.   Yes [provider]  polyethylene glycol (MIRALAX / GLYCOLAX) 17 g packet Take 17 g by mouth daily as needed for moderate constipation. 06/12/22  Yes Wieting, Richard, MD  spironolactone (ALDACTONE) 25 MG tablet Take 0.5 tablets (12.5 mg total) by mouth daily. 06/12/22  Yes Alford Highland, MD   SYNTHROID 88 MCG tablet Take 88 mcg by mouth daily before breakfast. 06/16/19  Yes [provider]   Review of Systems  Constitutional:  Positive for fatigue (chronic). Negative for appetite change.  HENT:  Negative for congestion, postnasal drip and sore throat.   Eyes:  Positive for visual disturbance (blurry vision).  Respiratory:  Positive for shortness of breath (at times). Negative for cough and wheezing.   Cardiovascular:  Positive for palpitations (sometimes in the mornings) and leg swelling (left ankle). Negative for chest pain.  Gastrointestinal:  Positive for constipation. Negative for abdominal distention, abdominal pain, diarrhea and nausea.  Endocrine: Negative.   Genitourinary: Negative.   Musculoskeletal:  Positive for arthralgias (both knees). Negative for back pain.  Skin: Negative.   Allergic/Immunologic: Negative.   Neurological:  Negative  for dizziness, light-headedness and headaches.  Hematological:  Negative for adenopathy. Does not bruise/bleed easily.  Psychiatric/Behavioral:  Negative for dysphoric mood and sleep disturbance (three pillows). The patient is not nervous/anxious.    Vitals:   01/21/23 1047  BP: 135/79  Pulse: (!) 55  SpO2: 98%  Weight: 230 lb (104.3 kg)   Wt Readings from Last 3 Encounters:  01/21/23 230 lb (104.3 kg)  12/02/22 231 lb (104.8 kg)  10/22/22 233 lb 8 oz (105.9 kg)   Lab Results  Component Value Date   CREATININE 1.08 (H) 10/22/2022   CREATININE 1.12 (H) 08/21/2022   CREATININE 0.78 06/12/2022   Physical Exam Vitals and nursing note reviewed.  Cardiovascular:     Rate and Rhythm: Regular rhythm. Bradycardia present.     Heart sounds: Normal heart sounds.  Pulmonary:     Effort: Pulmonary effort is normal.     Breath sounds: Normal breath sounds. No wheezing.  Abdominal:     Palpations: Abdomen is soft.  Musculoskeletal:     Right lower leg: No edema.     Left lower leg: Edema (trace pitting) present.   Skin:    General: Skin is warm and dry.  Neurological:     Mental Status: She is alert and oriented to person, place, and time.  Psychiatric:        Mood and Affect: Mood normal.        Behavior: Behavior normal.    Assessment and Plan:  1: Chronic Heart Failure with mildly reduced ejection fraction- - NYHA II - encouraged to resume weighing daily; reminded to call for an overnight weight gain of 3 pounds or more or a weekly weight gain of more than 5 pounds.  - weight down 3 pounds from last visit here 5 months ago - Echo 06/09/22 Left ventricular ejection fraction, by estimation, is 40 to 45% - continue jardiance 10mg  daily - continue metoprolol tartrate 50mg  BID - continue spironolactone 12.5mg  daily - was took off losartan due to angioedema with lisinopril - was unable to tolerate bidil so it was stopped - not adding salt to her food - saw cardiology Juliann Pares([place]) 02/24 - continue her walking; now walking 3000 steps daily - BNP 06/08/22 529.4 - PharmD reconciled meds w/ patient  2: A-Fib- - saw EP Maisie Fus([place]) 02/24 - continue apixaban 5mg  BID - continue metoprolol tartrate 50mg  BID - continue diltiazem as PRN  - cardioverted 08/13/22 & again 10/21/22 - ablation done 12/16/22 with initiation of flecainide 100mg  BID  3: HTN- - BP 135/79 - saw PCP Seward Carol([place]) 03/24 - BMP 10/22/22 showed sodium 140, potassium 4.1, creatinine 1.08 & GFR 58  4: DM- - Does not monitor blood sugar and is no longer on metformin - A1C 5.5% on 06/08/22  5: Sleep apnea- - saw pulmonology Meredeth Ide([place]) 08/06/22 - wearing CPAP nighlty   Due to HF stability, will not make a return appointment at this time. Advised patient that she could call back at anytime for questions or to make an appointment. Emphasized close follow-up with cardiologist and PCP.  She was comfortable with this plan.   .Marland Kitchen

## 2023-01-21 NOTE — Progress Notes (Signed)
[facility] - HEART FAILURE CLINIC - PHARMACIST COUNSELING NOTE  Guideline-Directed Medical Therapy/Evidence Based Medicine  ACE/ARB/ARNI:  None.  Beta Blocker: Metoprolol tartrate 10 mg twice daily Aldosterone Antagonist: Spironolactone 25 mg daily Diuretic: Furosemide 20 mg daily SGLT2i: Empagliflozin 10 mg daily  Adherence Assessment  Do you ever forget to take your medication? [] Yes [x] No  Do you ever skip doses due to side effects? [] Yes [x] No  Do you have trouble affording your medicines? Yes [x] [] No  Are you ever unable to pick up your medication due to transportation difficulties? [] Yes [x] No  Do you ever stop taking your medications because you don't believe they are helping? [] Yes [x] No  Do you check your weight daily? [] Yes [x] No   Adherence strategy: patient states to be compliant with medications. Using pill bottles to organize timing.   Barriers to obtaining medications: None identified at this visit. Patient stated jardiance was $40. I tried to get a co-pay card and it says there is already one activated.   Vital signs: HR 55, BP 135/79, weight (pounds) 230 ECHO: Date 05/2022, EF 40-45, notes no LVH, right ventricular mildly enlarged. Some mitral valve regurgitation,       Latest Ref Rng & Units 10/22/2022   10:38 AM 08/21/2022   11:10 AM 06/12/2022    4:46 AM  BMP  Glucose 70 - 99 mg/dL 75  85  [id number]   BUN 8 - 23 mg/dL 23  25  19    Creatinine 0.44 - 1.00 mg/dL 8.111.08  9.141.12  7.820.78   Sodium 135 - 145 mmol/L 140  143  140   Potassium 3.5 - 5.1 mmol/L 4.1  4.0  4.1   Chloride 98 - 111 mmol/L 106  109  113   CO2 22 - 32 mmol/L 25  27  23    Calcium 8.9 - 10.3 mg/dL 9.2  9.5  9.0     Past Medical History:  Diagnosis Date   Anxiety    Arrhythmia    PAF   Arthritis    RA   Borderline diabetes    CHF (congestive heart failure)    COVID-19 11/2019   Dyspnea    OFF AND ON SINCE COVID 2021   GERD (gastroesophageal reflux disease)    Goiter     Heart murmur    not being followed or treated. has lost weight with improvement of murmur   History of blood transfusion    unsure of when she had transfusion but states she had a terrible reaction and needed medication   Hypertension    Hyperthyroidism    Hypothyroidism    not on meds at this time   Medical history non-contributory    Pneumonia 11/2019   Rheumatoid arthritis    Sleep apnea    does not use cpap since weight loss    ASSESSMENT 62 year old female who presents to the HF clinic for follow up appointment. Patient recently had catheter ablation for afib. They started her on flecainide and anticoagulation. Patient has no complaints during this visit and states she is feeling a lot better. Blood pressure is well controled and weight is stable.   Recent ED Visit (past6 months): Date - 05/2022, [facility] - SOB and nausea   PLAN CHF/HTN - continue metoprolol tartrate, spironolactone, lasix 20 mg daily, and jaridance.   Afib - continue flecainide, metoprolol, and apixaban. Pt has a hx of CHF, so flecainide may not be the best agent for afib. [facility]  cardiology to adjust medications. Patient to follow up 90 days post ablation.     Time spent: 20 minutes  Ronnald Ramp, Pharm.D. Clinical Pharmacist 01/21/2023 11:35 AM    Current Outpatient Medications:    amoxicillin-clavulanate (AUGMENTIN) 875-125 MG tablet, Take 1 tablet by mouth 2 (two) times daily., Disp: , Rfl:    apixaban (ELIQUIS) 5 MG TABS tablet, Take 1 tablet (5 mg total) by mouth 2 (two) times daily., Disp: 60 tablet, Rfl: 0   cholecalciferol (VITAMIN D) 25 MCG (1000 UNIT) tablet, Take 2,000 Units by mouth in the morning., Disp: , Rfl:    diltiazem (CARDIZEM) 30 MG tablet, Take 1 tablet (30 mg total) by mouth every 6 (six) hours as needed (for sustained HR >130 for more than 15 mins. do not take if systolic blood pressure (top number) is lower than 100.)., Disp: 60 tablet, Rfl: 0   empagliflozin  (JARDIANCE) 10 MG TABS tablet, Take 1 tablet (10 mg total) by mouth daily before breakfast., Disp: 30 tablet, Rfl: 5   flecainide (TAMBOCOR) 100 MG tablet, Take 100 mg by mouth 2 (two) times daily., Disp: , Rfl:    folic acid (FOLVITE) 1 MG tablet, Take 1 mg by mouth in the morning., Disp: , Rfl:    furosemide (LASIX) 20 MG tablet, Take 20 mg by mouth daily., Disp: , Rfl:    gabapentin (NEURONTIN) 300 MG capsule, Take 300 mg by mouth at bedtime., Disp: , Rfl:    methotrexate (RHEUMATREX) 2.5 MG tablet, Take 20 mg by mouth every Sunday., Disp: , Rfl:    metoprolol tartrate (LOPRESSOR) 100 MG tablet, Take 1 tablet (100 mg total) by mouth 2 (two) times daily. (Patient taking differently: Take 50 mg by mouth 2 (two) times daily.), Disp: 60 tablet, Rfl: 0   omeprazole (PRILOSEC) 20 MG capsule, Take 20 mg by mouth daily as needed., Disp: , Rfl:    polyethylene glycol (MIRALAX / GLYCOLAX) 17 g packet, Take 17 g by mouth daily as needed for moderate constipation., Disp: 30 each, Rfl: 0   spironolactone (ALDACTONE) 25 MG tablet, Take 0.5 tablets (12.5 mg total) by mouth daily., Disp: 15 tablet, Rfl: 0   SYNTHROID 88 MCG tablet, Take 88 mcg by mouth daily before breakfast., Disp: , Rfl:    COUNSELING POINTS/CLINICAL PEARLS    DRUGS TO CAUTION IN HEART FAILURE  Drug or Class Mechanism  Analgesics NSAIDs COX-2 inhibitors Glucocorticoids  Sodium and water retention, increased systemic vascular resistance, decreased response to diuretics   Diabetes Medications Metformin Thiazolidinediones Rosiglitazone (Avandia) Pioglitazone (Actos) DPP4 Inhibitors Saxagliptin (Onglyza) Sitagliptin (Januvia)   Lactic acidosis Possible calcium channel blockade   Unknown  Antiarrhythmics Class I  Flecainide Disopyramide Class III Sotalol Other Dronedarone  Negative inotrope, proarrhythmic   Proarrhythmic, beta blockade  Negative inotrope  Antihypertensives Alpha Blockers Doxazosin Calcium  Channel Blockers Diltiazem Verapamil Nifedipine Central Alpha Adrenergics Moxonidine Peripheral Vasodilators Minoxidil  Increases renin and aldosterone  Negative inotrope    Possible sympathetic withdrawal  Unknown  Anti-infective Itraconazole Amphotericin B  Negative inotrope Unknown  Hematologic Anagrelide Cilostazol   Possible inhibition of PD IV Inhibition of PD III causing arrhythmias  Neurologic/Psychiatric Stimulants Anti-Seizure Drugs Carbamazepine Pregabalin Antidepressants Tricyclics Citalopram Parkinsons Bromocriptine Pergolide Pramipexole Antipsychotics Clozapine Antimigraine Ergotamine Methysergide Appetite suppressants Bipolar Lithium  Peripheral alpha and beta agonist activity  Negative inotrope and chronotrope Calcium channel blockade  Negative inotrope, proarrhythmic Dose-dependent QT prolongation  Excessive serotonin activity/valvular damage Excessive serotonin activity/valvular damage Unknown  IgE mediated hypersensitivy,  calcium channel blockade  Excessive serotonin activity/valvular damage Excessive serotonin activity/valvular damage Valvular damage  Direct myofibrillar degeneration, adrenergic stimulation  Antimalarials Chloroquine Hydroxychloroquine Intracellular inhibition of lysosomal enzymes  Urologic Agents Alpha Blockers Doxazosin Prazosin Tamsulosin Terazosin  Increased renin and aldosterone  Adapted from Page Williemae Natter, et al. "Drugs That May Cause or Exacerbate Heart Failure: A Scientific Statement from the American Heart  Association." Circulation 2016; 134:e32-e69. DOI: 10.1161/CIR.0000000000000426   MEDICATION ADHERENCES TIPS AND STRATEGIES Taking medication as prescribed improves patient outcomes in heart failure (reduces hospitalizations, improves symptoms, increases survival) Side effects of medications can be managed by decreasing doses, switching agents, stopping drugs, or adding additional therapy.  Please let someone in the Heart Failure Clinic know if you have having bothersome side effects so we can modify your regimen. Do not alter your medication regimen without talking to [place].  Medication reminders can help patients remember to take drugs on time. If you are missing or forgetting doses you can try linking behaviors, using pill boxes, or an electronic reminder like an alarm on your phone or an app. Some people can also get automated phone calls as medication reminders.

## 2023-01-21 NOTE — Patient Instructions (Signed)
Call us in the future if you need us for anything 

## 2023-01-26 ENCOUNTER — Other Ambulatory Visit: Payer: Self-pay | Admitting: Family

## 2023-02-08 ENCOUNTER — Other Ambulatory Visit: Payer: Self-pay | Admitting: Primary Care

## 2023-02-08 DIAGNOSIS — Z1231 Encounter for screening mammogram for malignant neoplasm of breast: Secondary | ICD-10-CM

## 2023-02-16 DIAGNOSIS — R0602 Shortness of breath: Secondary | ICD-10-CM | POA: Diagnosis not present

## 2023-02-16 DIAGNOSIS — R942 Abnormal results of pulmonary function studies: Secondary | ICD-10-CM | POA: Diagnosis not present

## 2023-02-16 DIAGNOSIS — I420 Dilated cardiomyopathy: Secondary | ICD-10-CM | POA: Diagnosis not present

## 2023-02-16 DIAGNOSIS — G4733 Obstructive sleep apnea (adult) (pediatric): Secondary | ICD-10-CM | POA: Diagnosis not present

## 2023-02-17 DIAGNOSIS — Z0131 Encounter for examination of blood pressure with abnormal findings: Secondary | ICD-10-CM | POA: Diagnosis not present

## 2023-02-17 DIAGNOSIS — G4733 Obstructive sleep apnea (adult) (pediatric): Secondary | ICD-10-CM | POA: Diagnosis not present

## 2023-02-17 DIAGNOSIS — I509 Heart failure, unspecified: Secondary | ICD-10-CM | POA: Diagnosis not present

## 2023-02-17 DIAGNOSIS — Z0130 Encounter for examination of blood pressure without abnormal findings: Secondary | ICD-10-CM | POA: Diagnosis not present

## 2023-02-17 DIAGNOSIS — Z124 Encounter for screening for malignant neoplasm of cervix: Secondary | ICD-10-CM | POA: Diagnosis not present

## 2023-02-17 DIAGNOSIS — Z1389 Encounter for screening for other disorder: Secondary | ICD-10-CM | POA: Diagnosis not present

## 2023-02-18 DIAGNOSIS — Z7901 Long term (current) use of anticoagulants: Secondary | ICD-10-CM | POA: Diagnosis not present

## 2023-02-18 DIAGNOSIS — I517 Cardiomegaly: Secondary | ICD-10-CM | POA: Diagnosis not present

## 2023-02-18 DIAGNOSIS — Z8679 Personal history of other diseases of the circulatory system: Secondary | ICD-10-CM | POA: Diagnosis not present

## 2023-02-18 DIAGNOSIS — I5189 Other ill-defined heart diseases: Secondary | ICD-10-CM | POA: Diagnosis not present

## 2023-02-18 DIAGNOSIS — K8020 Calculus of gallbladder without cholecystitis without obstruction: Secondary | ICD-10-CM | POA: Diagnosis not present

## 2023-02-18 DIAGNOSIS — I10 Essential (primary) hypertension: Secondary | ICD-10-CM | POA: Diagnosis not present

## 2023-02-18 DIAGNOSIS — I480 Paroxysmal atrial fibrillation: Secondary | ICD-10-CM | POA: Diagnosis not present

## 2023-02-18 DIAGNOSIS — Z01810 Encounter for preprocedural cardiovascular examination: Secondary | ICD-10-CM | POA: Diagnosis not present

## 2023-02-18 DIAGNOSIS — I083 Combined rheumatic disorders of mitral, aortic and tricuspid valves: Secondary | ICD-10-CM | POA: Diagnosis not present

## 2023-02-18 DIAGNOSIS — Z96651 Presence of right artificial knee joint: Secondary | ICD-10-CM | POA: Diagnosis not present

## 2023-02-18 DIAGNOSIS — Z96642 Presence of left artificial hip joint: Secondary | ICD-10-CM | POA: Diagnosis not present

## 2023-02-18 DIAGNOSIS — Z6841 Body Mass Index (BMI) 40.0 and over, adult: Secondary | ICD-10-CM | POA: Diagnosis not present

## 2023-02-25 DIAGNOSIS — E039 Hypothyroidism, unspecified: Secondary | ICD-10-CM | POA: Diagnosis not present

## 2023-02-25 DIAGNOSIS — I509 Heart failure, unspecified: Secondary | ICD-10-CM | POA: Diagnosis not present

## 2023-02-25 DIAGNOSIS — I4891 Unspecified atrial fibrillation: Secondary | ICD-10-CM | POA: Diagnosis not present

## 2023-02-25 DIAGNOSIS — D6869 Other thrombophilia: Secondary | ICD-10-CM | POA: Diagnosis not present

## 2023-02-25 DIAGNOSIS — I130 Hypertensive heart and chronic kidney disease with heart failure and stage 1 through stage 4 chronic kidney disease, or unspecified chronic kidney disease: Secondary | ICD-10-CM | POA: Diagnosis not present

## 2023-02-25 DIAGNOSIS — Z8249 Family history of ischemic heart disease and other diseases of the circulatory system: Secondary | ICD-10-CM | POA: Diagnosis not present

## 2023-02-25 DIAGNOSIS — Z823 Family history of stroke: Secondary | ICD-10-CM | POA: Diagnosis not present

## 2023-02-25 DIAGNOSIS — M1990 Unspecified osteoarthritis, unspecified site: Secondary | ICD-10-CM | POA: Diagnosis not present

## 2023-02-25 DIAGNOSIS — M069 Rheumatoid arthritis, unspecified: Secondary | ICD-10-CM | POA: Diagnosis not present

## 2023-02-25 DIAGNOSIS — K219 Gastro-esophageal reflux disease without esophagitis: Secondary | ICD-10-CM | POA: Diagnosis not present

## 2023-02-25 DIAGNOSIS — R32 Unspecified urinary incontinence: Secondary | ICD-10-CM | POA: Diagnosis not present

## 2023-02-25 DIAGNOSIS — Z833 Family history of diabetes mellitus: Secondary | ICD-10-CM | POA: Diagnosis not present

## 2023-03-04 ENCOUNTER — Ambulatory Visit
Admission: RE | Admit: 2023-03-04 | Discharge: 2023-03-04 | Disposition: A | Discharge status: Home / Self Care | Payer: 59 | Source: Ambulatory Visit | Attending: Primary Care | Admitting: Primary Care

## 2023-03-04 DIAGNOSIS — Z1231 Encounter for screening mammogram for malignant neoplasm of breast: Secondary | ICD-10-CM | POA: Diagnosis not present

## 2023-03-07 ENCOUNTER — Other Ambulatory Visit: Payer: Self-pay | Admitting: Family

## 2023-03-16 DIAGNOSIS — I5020 Unspecified systolic (congestive) heart failure: Secondary | ICD-10-CM | POA: Diagnosis not present

## 2023-03-20 DIAGNOSIS — G4733 Obstructive sleep apnea (adult) (pediatric): Secondary | ICD-10-CM | POA: Diagnosis not present

## 2023-03-23 DIAGNOSIS — Z1389 Encounter for screening for other disorder: Secondary | ICD-10-CM | POA: Diagnosis not present

## 2023-03-23 DIAGNOSIS — I5020 Unspecified systolic (congestive) heart failure: Secondary | ICD-10-CM | POA: Diagnosis not present

## 2023-03-23 DIAGNOSIS — Z0131 Encounter for examination of blood pressure with abnormal findings: Secondary | ICD-10-CM | POA: Diagnosis not present

## 2023-03-23 DIAGNOSIS — I509 Heart failure, unspecified: Secondary | ICD-10-CM | POA: Diagnosis not present

## 2023-03-31 DIAGNOSIS — G5601 Carpal tunnel syndrome, right upper limb: Secondary | ICD-10-CM | POA: Diagnosis not present

## 2023-03-31 DIAGNOSIS — G959 Disease of spinal cord, unspecified: Secondary | ICD-10-CM | POA: Diagnosis not present

## 2023-03-31 DIAGNOSIS — M792 Neuralgia and neuritis, unspecified: Secondary | ICD-10-CM | POA: Diagnosis not present

## 2023-04-05 ENCOUNTER — Other Ambulatory Visit: Payer: Self-pay | Admitting: Family

## 2023-05-03 ENCOUNTER — Other Ambulatory Visit: Payer: Self-pay | Admitting: Family

## 2023-05-03 DIAGNOSIS — Z79899 Other long term (current) drug therapy: Secondary | ICD-10-CM | POA: Diagnosis not present

## 2023-05-03 DIAGNOSIS — M0609 Rheumatoid arthritis without rheumatoid factor, multiple sites: Secondary | ICD-10-CM | POA: Diagnosis not present

## 2023-05-03 DIAGNOSIS — E039 Hypothyroidism, unspecified: Secondary | ICD-10-CM | POA: Diagnosis not present

## 2023-05-03 DIAGNOSIS — M170 Bilateral primary osteoarthritis of knee: Secondary | ICD-10-CM | POA: Diagnosis not present

## 2023-05-03 DIAGNOSIS — E042 Nontoxic multinodular goiter: Secondary | ICD-10-CM | POA: Diagnosis not present

## 2023-05-06 DIAGNOSIS — R7309 Other abnormal glucose: Secondary | ICD-10-CM | POA: Diagnosis not present

## 2023-05-06 DIAGNOSIS — E785 Hyperlipidemia, unspecified: Secondary | ICD-10-CM | POA: Diagnosis not present

## 2023-05-06 DIAGNOSIS — Z1389 Encounter for screening for other disorder: Secondary | ICD-10-CM | POA: Diagnosis not present

## 2023-05-06 DIAGNOSIS — Z0130 Encounter for examination of blood pressure without abnormal findings: Secondary | ICD-10-CM | POA: Diagnosis not present

## 2023-05-20 DIAGNOSIS — I4891 Unspecified atrial fibrillation: Secondary | ICD-10-CM | POA: Diagnosis not present

## 2023-05-20 DIAGNOSIS — Z8679 Personal history of other diseases of the circulatory system: Secondary | ICD-10-CM | POA: Diagnosis not present

## 2023-05-20 DIAGNOSIS — E669 Obesity, unspecified: Secondary | ICD-10-CM | POA: Diagnosis not present

## 2023-05-20 DIAGNOSIS — I5020 Unspecified systolic (congestive) heart failure: Secondary | ICD-10-CM | POA: Diagnosis not present

## 2023-05-20 DIAGNOSIS — M0609 Rheumatoid arthritis without rheumatoid factor, multiple sites: Secondary | ICD-10-CM | POA: Diagnosis not present

## 2023-05-20 DIAGNOSIS — G4733 Obstructive sleep apnea (adult) (pediatric): Secondary | ICD-10-CM | POA: Diagnosis not present

## 2023-05-20 DIAGNOSIS — K219 Gastro-esophageal reflux disease without esophagitis: Secondary | ICD-10-CM | POA: Diagnosis not present

## 2023-05-20 DIAGNOSIS — Z9889 Other specified postprocedural states: Secondary | ICD-10-CM | POA: Diagnosis not present

## 2023-05-20 DIAGNOSIS — I4819 Other persistent atrial fibrillation: Secondary | ICD-10-CM | POA: Diagnosis not present

## 2023-05-20 DIAGNOSIS — Z6841 Body Mass Index (BMI) 40.0 and over, adult: Secondary | ICD-10-CM | POA: Diagnosis not present

## 2023-05-20 DIAGNOSIS — I10 Essential (primary) hypertension: Secondary | ICD-10-CM | POA: Diagnosis not present

## 2023-06-18 DIAGNOSIS — Z719 Counseling, unspecified: Secondary | ICD-10-CM | POA: Diagnosis not present

## 2023-06-18 DIAGNOSIS — Z1389 Encounter for screening for other disorder: Secondary | ICD-10-CM | POA: Diagnosis not present

## 2023-06-18 DIAGNOSIS — Z0130 Encounter for examination of blood pressure without abnormal findings: Secondary | ICD-10-CM | POA: Diagnosis not present

## 2023-06-28 DIAGNOSIS — Z5941 Food insecurity: Secondary | ICD-10-CM | POA: Diagnosis not present

## 2023-06-28 DIAGNOSIS — R0609 Other forms of dyspnea: Secondary | ICD-10-CM | POA: Diagnosis not present

## 2023-06-28 DIAGNOSIS — E039 Hypothyroidism, unspecified: Secondary | ICD-10-CM | POA: Diagnosis not present

## 2023-06-28 DIAGNOSIS — I420 Dilated cardiomyopathy: Secondary | ICD-10-CM | POA: Diagnosis not present

## 2023-06-28 DIAGNOSIS — G4733 Obstructive sleep apnea (adult) (pediatric): Secondary | ICD-10-CM | POA: Diagnosis not present

## 2023-07-23 DIAGNOSIS — F4323 Adjustment disorder with mixed anxiety and depressed mood: Secondary | ICD-10-CM | POA: Diagnosis not present

## 2023-08-27 DIAGNOSIS — Z0130 Encounter for examination of blood pressure without abnormal findings: Secondary | ICD-10-CM | POA: Diagnosis not present

## 2023-08-27 DIAGNOSIS — Z1389 Encounter for screening for other disorder: Secondary | ICD-10-CM | POA: Diagnosis not present

## 2023-08-27 DIAGNOSIS — I10 Essential (primary) hypertension: Secondary | ICD-10-CM | POA: Diagnosis not present

## 2023-09-03 DIAGNOSIS — M170 Bilateral primary osteoarthritis of knee: Secondary | ICD-10-CM | POA: Diagnosis not present

## 2023-09-03 DIAGNOSIS — M0609 Rheumatoid arthritis without rheumatoid factor, multiple sites: Secondary | ICD-10-CM | POA: Diagnosis not present

## 2023-09-03 DIAGNOSIS — Z7960 Long term (current) use of unspecified immunomodulators and immunosuppressants: Secondary | ICD-10-CM | POA: Diagnosis not present

## 2023-09-14 DIAGNOSIS — I5032 Chronic diastolic (congestive) heart failure: Secondary | ICD-10-CM | POA: Diagnosis not present

## 2023-09-27 DIAGNOSIS — M0609 Rheumatoid arthritis without rheumatoid factor, multiple sites: Secondary | ICD-10-CM | POA: Diagnosis not present

## 2023-09-27 DIAGNOSIS — R0609 Other forms of dyspnea: Secondary | ICD-10-CM | POA: Diagnosis not present

## 2023-09-27 DIAGNOSIS — Z23 Encounter for immunization: Secondary | ICD-10-CM | POA: Diagnosis not present

## 2023-09-27 DIAGNOSIS — G4733 Obstructive sleep apnea (adult) (pediatric): Secondary | ICD-10-CM | POA: Diagnosis not present

## 2023-09-27 DIAGNOSIS — Z6841 Body Mass Index (BMI) 40.0 and over, adult: Secondary | ICD-10-CM | POA: Diagnosis not present

## 2023-09-30 DIAGNOSIS — G959 Disease of spinal cord, unspecified: Secondary | ICD-10-CM | POA: Diagnosis not present

## 2023-09-30 DIAGNOSIS — M792 Neuralgia and neuritis, unspecified: Secondary | ICD-10-CM | POA: Diagnosis not present

## 2023-09-30 DIAGNOSIS — R200 Anesthesia of skin: Secondary | ICD-10-CM | POA: Diagnosis not present

## 2023-09-30 DIAGNOSIS — G5601 Carpal tunnel syndrome, right upper limb: Secondary | ICD-10-CM | POA: Diagnosis not present

## 2023-10-01 DIAGNOSIS — Z0130 Encounter for examination of blood pressure without abnormal findings: Secondary | ICD-10-CM | POA: Diagnosis not present

## 2023-10-01 DIAGNOSIS — R7309 Other abnormal glucose: Secondary | ICD-10-CM | POA: Diagnosis not present

## 2023-10-01 DIAGNOSIS — Z1389 Encounter for screening for other disorder: Secondary | ICD-10-CM | POA: Diagnosis not present

## 2023-10-12 DIAGNOSIS — G4730 Sleep apnea, unspecified: Secondary | ICD-10-CM | POA: Diagnosis not present

## 2023-10-20 DIAGNOSIS — G4733 Obstructive sleep apnea (adult) (pediatric): Secondary | ICD-10-CM | POA: Diagnosis not present

## 2023-10-27 ENCOUNTER — Other Ambulatory Visit (HOSPITAL_COMMUNITY): Payer: Self-pay

## 2023-11-02 ENCOUNTER — Other Ambulatory Visit: Payer: Self-pay | Admitting: Family

## 2023-11-05 DIAGNOSIS — F4323 Adjustment disorder with mixed anxiety and depressed mood: Secondary | ICD-10-CM | POA: Diagnosis not present

## 2023-11-08 ENCOUNTER — Other Ambulatory Visit (HOSPITAL_COMMUNITY): Payer: Self-pay

## 2023-11-09 ENCOUNTER — Other Ambulatory Visit (HOSPITAL_COMMUNITY): Payer: Self-pay

## 2023-11-25 DIAGNOSIS — M0609 Rheumatoid arthritis without rheumatoid factor, multiple sites: Secondary | ICD-10-CM | POA: Diagnosis not present

## 2023-11-25 DIAGNOSIS — I509 Heart failure, unspecified: Secondary | ICD-10-CM | POA: Diagnosis not present

## 2023-11-25 DIAGNOSIS — E785 Hyperlipidemia, unspecified: Secondary | ICD-10-CM | POA: Diagnosis not present

## 2023-11-25 DIAGNOSIS — I4819 Other persistent atrial fibrillation: Secondary | ICD-10-CM | POA: Diagnosis not present

## 2023-11-25 DIAGNOSIS — R600 Localized edema: Secondary | ICD-10-CM | POA: Diagnosis not present

## 2023-11-25 DIAGNOSIS — Z6841 Body Mass Index (BMI) 40.0 and over, adult: Secondary | ICD-10-CM | POA: Diagnosis not present

## 2023-11-25 DIAGNOSIS — E669 Obesity, unspecified: Secondary | ICD-10-CM | POA: Diagnosis not present

## 2023-11-25 DIAGNOSIS — Z9289 Personal history of other medical treatment: Secondary | ICD-10-CM | POA: Diagnosis not present

## 2023-11-25 DIAGNOSIS — I4891 Unspecified atrial fibrillation: Secondary | ICD-10-CM | POA: Diagnosis not present

## 2023-11-25 DIAGNOSIS — K219 Gastro-esophageal reflux disease without esophagitis: Secondary | ICD-10-CM | POA: Diagnosis not present

## 2023-11-25 DIAGNOSIS — Z9889 Other specified postprocedural states: Secondary | ICD-10-CM | POA: Diagnosis not present

## 2023-11-25 DIAGNOSIS — G4733 Obstructive sleep apnea (adult) (pediatric): Secondary | ICD-10-CM | POA: Diagnosis not present

## 2023-11-25 DIAGNOSIS — I10 Essential (primary) hypertension: Secondary | ICD-10-CM | POA: Diagnosis not present

## 2023-12-03 ENCOUNTER — Other Ambulatory Visit: Payer: Self-pay | Admitting: Family

## 2023-12-10 DIAGNOSIS — E785 Hyperlipidemia, unspecified: Secondary | ICD-10-CM | POA: Diagnosis not present

## 2023-12-10 DIAGNOSIS — I10 Essential (primary) hypertension: Secondary | ICD-10-CM | POA: Diagnosis not present

## 2024-01-03 DIAGNOSIS — M0609 Rheumatoid arthritis without rheumatoid factor, multiple sites: Secondary | ICD-10-CM | POA: Diagnosis not present

## 2024-01-03 DIAGNOSIS — Z7960 Long term (current) use of unspecified immunomodulators and immunosuppressants: Secondary | ICD-10-CM | POA: Diagnosis not present

## 2024-01-03 DIAGNOSIS — M170 Bilateral primary osteoarthritis of knee: Secondary | ICD-10-CM | POA: Diagnosis not present

## 2024-02-04 DIAGNOSIS — E039 Hypothyroidism, unspecified: Secondary | ICD-10-CM | POA: Diagnosis not present

## 2024-02-04 DIAGNOSIS — I10 Essential (primary) hypertension: Secondary | ICD-10-CM | POA: Diagnosis not present

## 2024-02-04 DIAGNOSIS — Z1389 Encounter for screening for other disorder: Secondary | ICD-10-CM | POA: Diagnosis not present

## 2024-02-04 DIAGNOSIS — Z0000 Encounter for general adult medical examination without abnormal findings: Secondary | ICD-10-CM | POA: Diagnosis not present

## 2024-02-04 DIAGNOSIS — Z0131 Encounter for examination of blood pressure with abnormal findings: Secondary | ICD-10-CM | POA: Diagnosis not present

## 2024-02-04 DIAGNOSIS — R7309 Other abnormal glucose: Secondary | ICD-10-CM | POA: Diagnosis not present

## 2024-02-08 DIAGNOSIS — K219 Gastro-esophageal reflux disease without esophagitis: Secondary | ICD-10-CM | POA: Diagnosis not present

## 2024-02-08 DIAGNOSIS — Z008 Encounter for other general examination: Secondary | ICD-10-CM | POA: Diagnosis not present

## 2024-02-08 DIAGNOSIS — Z888 Allergy status to other drugs, medicaments and biological substances status: Secondary | ICD-10-CM | POA: Diagnosis not present

## 2024-02-08 DIAGNOSIS — E785 Hyperlipidemia, unspecified: Secondary | ICD-10-CM | POA: Diagnosis not present

## 2024-02-08 DIAGNOSIS — Z8249 Family history of ischemic heart disease and other diseases of the circulatory system: Secondary | ICD-10-CM | POA: Diagnosis not present

## 2024-02-08 DIAGNOSIS — Z823 Family history of stroke: Secondary | ICD-10-CM | POA: Diagnosis not present

## 2024-02-08 DIAGNOSIS — Z809 Family history of malignant neoplasm, unspecified: Secondary | ICD-10-CM | POA: Diagnosis not present

## 2024-02-08 DIAGNOSIS — Z87891 Personal history of nicotine dependence: Secondary | ICD-10-CM | POA: Diagnosis not present

## 2024-02-08 DIAGNOSIS — G4733 Obstructive sleep apnea (adult) (pediatric): Secondary | ICD-10-CM | POA: Diagnosis not present

## 2024-02-08 DIAGNOSIS — E039 Hypothyroidism, unspecified: Secondary | ICD-10-CM | POA: Diagnosis not present

## 2024-02-08 DIAGNOSIS — F324 Major depressive disorder, single episode, in partial remission: Secondary | ICD-10-CM | POA: Diagnosis not present

## 2024-02-08 DIAGNOSIS — Z833 Family history of diabetes mellitus: Secondary | ICD-10-CM | POA: Diagnosis not present

## 2024-02-17 ENCOUNTER — Other Ambulatory Visit: Payer: Self-pay | Admitting: Primary Care

## 2024-02-17 DIAGNOSIS — Z1231 Encounter for screening mammogram for malignant neoplasm of breast: Secondary | ICD-10-CM

## 2024-03-10 ENCOUNTER — Ambulatory Visit
Admission: RE | Admit: 2024-03-10 | Discharge: 2024-03-10 | Disposition: A | Discharge status: Home / Self Care | Source: Ambulatory Visit | Attending: Primary Care | Admitting: Primary Care

## 2024-03-10 DIAGNOSIS — Z1231 Encounter for screening mammogram for malignant neoplasm of breast: Secondary | ICD-10-CM | POA: Diagnosis not present

## 2024-03-14 DIAGNOSIS — E669 Obesity, unspecified: Secondary | ICD-10-CM | POA: Diagnosis not present

## 2024-03-14 DIAGNOSIS — I5032 Chronic diastolic (congestive) heart failure: Secondary | ICD-10-CM | POA: Diagnosis not present

## 2024-03-14 DIAGNOSIS — I4891 Unspecified atrial fibrillation: Secondary | ICD-10-CM | POA: Diagnosis not present

## 2024-03-23 NOTE — Congregational Nurse Program (Signed)
  Dept: [phone]   Congregational Nurse Program Note  Date of Encounter: 03/23/2024  Past Medical History: Past Medical History:  Diagnosis Date   Anxiety    Arrhythmia    PAF   Arthritis    RA   Borderline diabetes    CHF (congestive heart failure) (HCC)    COVID-19 11/2019   Dyspnea    OFF AND ON SINCE COVID 2021   GERD (gastroesophageal reflux disease)    Goiter    Heart murmur    not being followed or treated. has lost weight with improvement of murmur   History of blood transfusion    unsure of when she had transfusion but states she had a terrible reaction and needed medication   Hypertension    Hyperthyroidism    Hypothyroidism    not on meds at this time   Medical history non-contributory    Pneumonia 11/2019   Rheumatoid arthritis (HCC)    Sleep apnea    does not use cpap since weight loss    Encounter Details:  Community Questionnaire - 03/23/24 2025       Questionnaire   Ask client: Do you give verbal consent for me to treat you today? Yes    Student Assistance [employer] Nurse    Location Patient Served  [place]    Encounter Setting CN site    Population Status Unknown    Engineer, building services or [place] Insurance    Insurance/Financial Assistance Referral N/A    Medication N/A    Medical Provider Yes    Screening Referrals Made N/A    Medical Referrals Made N/A    Medical Appointment Completed N/A    CNP Interventions Advocate/Support;Educate    Screenings CN Performed Blood Pressure    ED Visit Averted N/A    Life-Saving Intervention Made N/A          Today's Vitals   03/23/24 2024  BP: (!) 170/80   There is no height or weight on file to calculate BMI.   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 Patient was offered and declined handout for high blood pressure education.  Patient was told to decrease salty food intake and to increase water intake to help with lowering BP. Cricket Doll,  MSN, RN

## 2024-03-31 ENCOUNTER — Encounter: Payer: Self-pay | Admitting: Certified Nurse Midwife

## 2024-03-31 ENCOUNTER — Other Ambulatory Visit (HOSPITAL_COMMUNITY)
Admission: RE | Admit: 2024-03-31 | Discharge: 2024-03-31 | Disposition: A | Discharge status: Home / Self Care | Source: Ambulatory Visit | Attending: Certified Nurse Midwife | Admitting: Certified Nurse Midwife

## 2024-03-31 ENCOUNTER — Ambulatory Visit: Admitting: Certified Nurse Midwife

## 2024-03-31 VITALS — BP 134/74 | HR 61 | Ht 64.0 in | Wt 231.8 lb

## 2024-03-31 DIAGNOSIS — N941 Unspecified dyspareunia: Secondary | ICD-10-CM | POA: Insufficient documentation

## 2024-03-31 NOTE — Progress Notes (Unsigned)
 Ardia Kraft, NP   Chief Complaint  Patient presents with   Dyspareunia    HPI:      Alexandria Moore is a 63 y.o. No obstetric history on file. whose LMP was Patient's last menstrual period was 01/17/2015., presents today for pain with intercourse. This started a little over a year ago, she notes it started after her cardiac ablation. She denies vaginal bleeding, changes to vaginal discharge or pain with urination. Reports discomfort is to right inside of her vagina and happens with initial penetration.    Patient Active Problem List   Diagnosis Date Noted   Multifocal pneumonia 06/10/2022   Paroxysmal atrial fibrillation with RVR (HCC) 06/10/2022   RA (rheumatoid arthritis) (HCC) 06/10/2022   Immunosuppressed status (HCC) 06/10/2022   Hypothyroidism, unspecified 06/10/2022   Acute respiratory failure (HCC) 06/08/2022   Total knee replacement status, right 01/31/2021   S/P TKR (total knee replacement) using cement, right 01/28/2021   Cervical myelopathy (HCC) 01/17/2020   Essential hypertension 12/05/2019   Pneumonia due to COVID-19 virus 12/05/2019   Acute respiratory failure with hypoxia (HCC) 12/05/2019   Congestive heart failure (HCC) 12/05/2019   Thrombocytopenia (HCC) 12/05/2019   Elevated troponin level 12/05/2019   Primary osteoarthritis of both knees 04/25/2019   Primary osteoarthritis involving multiple joints 04/25/2019   Bilateral edema of lower extremity 02/15/2019   Status post total hip replacement, left 05/12/2018   BMI 40.0-44.9, adult (HCC) 04/20/2018    Past Surgical History:  Procedure Laterality Date   ANTERIOR CERVICAL DECOMP/DISCECTOMY FUSION N/A 01/18/2020   Procedure: ANTERIOR CERVICAL DECOMPRESSION/DISCECTOMY FUSION 1 LEVEL;  Surgeon: Jodeen Munch, MD;  Location: [facility];  Service: Neurosurgery;  Laterality: N/A;  C4-5   CARDIOVERSION N/A 08/13/2022   Procedure: CARDIOVERSION;  Surgeon: Antonette Batters, MD;  Location: [facility];   Service: Cardiovascular;  Laterality: N/A;   CARDIOVERSION N/A 10/21/2022   Procedure: CARDIOVERSION;  Surgeon: Antonette Batters, MD;  Location: [facility];  Service: Cardiovascular;  Laterality: N/A;   CESAREAN SECTION  1982   x 1   COLONOSCOPY WITH PROPOFOL  N/A 03/12/2022   Procedure: COLONOSCOPY WITH PROPOFOL ;  Surgeon: Selena Daily, MD;  Location: [facility];  Service: Gastroenterology;  Laterality: N/A;   ENDOMETRIAL ABLATION     TOTAL HIP ARTHROPLASTY Left 05/12/2018   Procedure: TOTAL HIP ARTHROPLASTY ANTERIOR APPROACH;  Surgeon: Molli Angelucci, MD;  Location: [facility];  Service: Orthopedics;  Laterality: Left;   TOTAL KNEE ARTHROPLASTY Right 01/28/2021   Procedure: TOTAL KNEE ARTHROPLASTY;  Surgeon: Molli Angelucci, MD;  Location: [facility];  Service: Orthopedics;  Laterality: Right;    Family History  Problem Relation Age of Onset   Breast cancer Neg Hx     Social History   Socioeconomic History   Marital status: Single    Spouse name: Not on file   Number of children: 2   Years of education: 12   Highest education level: High school graduate  Occupational History    Employer: [employer]  Tobacco Use   Smoking status: Former    Current packs/day: 0.00    Types: Cigarettes    Quit date: 2014    Years since quitting: 11.4   Smokeless tobacco: Never   Tobacco comments:    SMOKED LESS THAN A YEAR   Vaping Use   Vaping status: Never Used  Substance and Sexual Activity   Alcohol use: Yes    Comment: OCC WINE   Drug use: Never  Sexual activity: Yes    Birth control/protection: Post-menopausal  Other Topics Concern   Not on file  Social History Narrative   Not on file   Social Drivers of Health   Financial Resource Strain: High Risk (06/28/2023)   Received [facility]   Overall Financial Resource Strain (CARDIA)    Difficulty of Paying Living Expenses: Hard  Food Insecurity: Patient Declined (03/23/2024)   Hunger  Vital Sign    Worried About Running Out of Food in the Last Year: Patient declined    Ran Out of Food in the Last Year: Patient declined  Transportation Needs: Patient Declined (03/23/2024)   PRAPARE - Administrator, Civil Service (Medical): Patient declined    Lack of Transportation (Non-Medical): Patient declined  Physical Activity: Not on file  Stress: Not on file  Social Connections: Not on file  Intimate Partner Violence: Patient Declined (03/23/2024)   Humiliation, Afraid, Rape, and Kick questionnaire    Fear of Current or Ex-Partner: Patient declined    Emotionally Abused: Patient declined    Physically Abused: Patient declined    Sexually Abused: Patient declined    Outpatient Medications Prior to Visit  Medication Sig Dispense Refill   apixaban  (ELIQUIS ) 5 MG TABS tablet Take 1 tablet (5 mg total) by mouth 2 (two) times daily. 60 tablet 0   cholecalciferol (VITAMIN D) 25 MCG (1000 UNIT) tablet Take 2,000 Units by mouth in the morning.     diltiazem  (CARDIZEM ) 30 MG tablet Take 1 tablet (30 mg total) by mouth every 6 (six) hours as needed (for sustained HR >130 for more than 15 mins. do not take if systolic blood pressure (top number) is lower than 100.). 60 tablet 0   estradiol (ESTRACE) 0.1 MG/GM vaginal cream ESTRADIOL 0.1 MG/GM CREA     folic acid  (FOLVITE ) 1 MG tablet Take 1 mg by mouth in the morning.     furosemide  (LASIX ) 20 MG tablet Take 20 mg by mouth daily.     gabapentin  (NEURONTIN ) 300 MG capsule Take 300 mg by mouth at bedtime.     JARDIANCE  10 MG TABS tablet TAKE 1 TABLET BY MOUTH ONCE DAILY BEFORE BREAKFAST 30 tablet 0   methotrexate  (RHEUMATREX) 2.5 MG tablet Take 20 mg by mouth every Sunday.     metoprolol  tartrate (LOPRESSOR ) 100 MG tablet Take 1 tablet (100 mg total) by mouth 2 (two) times daily. (Patient taking differently: Take 50 mg by mouth 2 (two) times daily.) 60 tablet 0   omeprazole (PRILOSEC) 20 MG capsule Take 20 mg by mouth daily as  needed.     polyethylene glycol (MIRALAX  / GLYCOLAX ) 17 g packet Take 17 g by mouth daily as needed for moderate constipation. 30 each 0   rosuvastatin (CRESTOR) 20 MG tablet Take 20 mg by mouth daily.     spironolactone  (ALDACTONE ) 25 MG tablet Take 0.5 tablets (12.5 mg total) by mouth daily. 15 tablet 0   SYNTHROID  88 MCG tablet Take 88 mcg by mouth daily before breakfast.     flecainide (TAMBOCOR) 100 MG tablet Take 100 mg by mouth 2 (two) times daily. (Patient not taking: Reported on 03/31/2024)     No facility-administered medications prior to visit.      ROS:  Review of Systems   OBJECTIVE:   Vitals:  BP 134/74   Pulse 61   Ht 5' 4 (1.626 m)   Wt 231 lb 12.8 oz (105.1 kg)   LMP 01/17/2015  BMI 39.79 kg/m   Physical Exam  Results: No results found for this or any previous visit (from the past 24 hours).   Assessment/Plan: Dyspareunia in female - Plan: Cervicovaginal ancillary only    No orders of the defined types were placed in this encounter.    Forestine Igo, CNM 03/31/2024 9:48 AM

## 2024-04-02 ENCOUNTER — Encounter: Payer: Self-pay | Admitting: Certified Nurse Midwife

## 2024-04-03 ENCOUNTER — Encounter: Admitting: Obstetrics

## 2024-04-03 ENCOUNTER — Ambulatory Visit: Payer: Self-pay | Admitting: Certified Nurse Midwife

## 2024-04-03 ENCOUNTER — Other Ambulatory Visit: Payer: Self-pay | Admitting: Certified Nurse Midwife

## 2024-04-03 LAB — CERVICOVAGINAL ANCILLARY ONLY
Bacterial Vaginitis (gardnerella): POSITIVE — AB
Candida Glabrata: NEGATIVE
Candida Vaginitis: NEGATIVE
Comment: NEGATIVE
Comment: NEGATIVE
Comment: NEGATIVE

## 2024-04-03 MED ORDER — METRONIDAZOLE 500 MG PO TABS: 500.0000 mg | ORAL_TABLET | Freq: Two times a day (BID) | ORAL | 0 refills | Status: AC

## 2024-04-03 NOTE — Progress Notes (Signed)
 TC to [facility] [place], +BV on Aptima swab, prefers oral treatment, prescription to [facility] on file.

## 2024-04-28 ENCOUNTER — Ambulatory Visit: Admitting: Certified Nurse Midwife

## 2024-06-07 ENCOUNTER — Ambulatory Visit: Admitting: Certified Nurse Midwife

## 2024-06-07 ENCOUNTER — Encounter: Payer: Self-pay | Admitting: Certified Nurse Midwife

## 2024-06-07 VITALS — BP 130/81 | HR 57 | Ht 64.0 in | Wt 235.8 lb

## 2024-06-07 DIAGNOSIS — N941 Unspecified dyspareunia: Secondary | ICD-10-CM

## 2024-06-07 DIAGNOSIS — N952 Postmenopausal atrophic vaginitis: Secondary | ICD-10-CM | POA: Diagnosis not present

## 2024-06-07 NOTE — Progress Notes (Signed)
 Alexandria Raisin, NP   Chief Complaint  Patient presents with   Dyspareunia   Follow-up    Dyspareunia    HPI:      Alexandria Moore is a 63 y.o. who is ~10y postmenopausal, LMP was Patient's last menstrual period was 01/17/2015., presents today for follow up after starting estradiol cream vaginally. Reports that she has not had pain with IC since starting, though only once in last 2 months. She continues to physically recover from Afib last year.     Patient Active Problem List   Diagnosis Date Noted   Multifocal pneumonia 06/10/2022   Paroxysmal atrial fibrillation with RVR (HCC) 06/10/2022   RA (rheumatoid arthritis) (HCC) 06/10/2022   Immunosuppressed status (HCC) 06/10/2022   Hypothyroidism, unspecified 06/10/2022   Acute respiratory failure (HCC) 06/08/2022   Total knee replacement status, right 01/31/2021   S/P TKR (total knee replacement) using cement, right 01/28/2021   Cervical myelopathy (HCC) 01/17/2020   Essential hypertension 12/05/2019   Pneumonia due to COVID-19 virus 12/05/2019   Acute respiratory failure with hypoxia (HCC) 12/05/2019   Congestive heart failure (HCC) 12/05/2019   Thrombocytopenia (HCC) 12/05/2019   Elevated troponin level 12/05/2019   Primary osteoarthritis of both knees 04/25/2019   Primary osteoarthritis involving multiple joints 04/25/2019   Bilateral edema of lower extremity 02/15/2019   Status post total hip replacement, left 05/12/2018   BMI 40.0-44.9, adult (HCC) 04/20/2018    Past Surgical History:  Procedure Laterality Date   ANTERIOR CERVICAL DECOMP/DISCECTOMY FUSION N/A 01/18/2020   Procedure: ANTERIOR CERVICAL DECOMPRESSION/DISCECTOMY FUSION 1 LEVEL;  Surgeon: Clois Fret, MD;  Location: [facility];  Service: Neurosurgery;  Laterality: N/A;  C4-5   CARDIOVERSION N/A 08/13/2022   Procedure: CARDIOVERSION;  Surgeon: Florencio Cara BIRCH, MD;  Location: [facility];  Service: Cardiovascular;  Laterality: N/A;    CARDIOVERSION N/A 10/21/2022   Procedure: CARDIOVERSION;  Surgeon: Florencio Cara BIRCH, MD;  Location: [facility];  Service: Cardiovascular;  Laterality: N/A;   CESAREAN SECTION  1982   x 1   COLONOSCOPY WITH PROPOFOL  N/A 03/12/2022   Procedure: COLONOSCOPY WITH PROPOFOL ;  Surgeon: Unk Corinn Skiff, MD;  Location: [facility];  Service: Gastroenterology;  Laterality: N/A;   ENDOMETRIAL ABLATION     TOTAL HIP ARTHROPLASTY Left 05/12/2018   Procedure: TOTAL HIP ARTHROPLASTY ANTERIOR APPROACH;  Surgeon: Kathlynn Sharper, MD;  Location: [facility];  Service: Orthopedics;  Laterality: Left;   TOTAL KNEE ARTHROPLASTY Right 01/28/2021   Procedure: TOTAL KNEE ARTHROPLASTY;  Surgeon: Kathlynn Sharper, MD;  Location: [facility];  Service: Orthopedics;  Laterality: Right;    Family History  Problem Relation Age of Onset   Breast cancer Neg Hx     Social History   Socioeconomic History   Marital status: Single    Spouse name: Not on file   Number of children: 2   Years of education: 12   Highest education level: High school graduate  Occupational History    Employer: [employer]  Tobacco Use   Smoking status: Former    Current packs/day: 0.00    Types: Cigarettes    Quit date: 2014    Years since quitting: 11.6   Smokeless tobacco: Never   Tobacco comments:    SMOKED LESS THAN A YEAR   Vaping Use   Vaping status: Never Used  Substance and Sexual Activity   Alcohol use: Yes    Comment: OCC WINE   Drug use: Never   Sexual activity: Yes  Birth control/protection: Post-menopausal  Other Topics Concern   Not on file  Social History Narrative   Not on file   Social Drivers of Health   Financial Resource Strain: High Risk (06/28/2023)   Received from [facility]   Overall Financial Resource Strain (CARDIA)    Difficulty of Paying Living Expenses: Hard  Food Insecurity: Patient Declined (03/23/2024)   Hunger Vital Sign    Worried About Running Out of Food  in the Last Year: Patient declined    Ran Out of Food in the Last Year: Patient declined  Transportation Needs: Patient Declined (03/23/2024)   PRAPARE - Administrator, Civil Service (Medical): Patient declined    Lack of Transportation (Non-Medical): Patient declined  Physical Activity: Not on file  Stress: Not on file  Social Connections: Not on file  Intimate Partner Violence: Patient Declined (03/23/2024)   Humiliation, Afraid, Rape, and Kick questionnaire    Fear of Current or Ex-Partner: Patient declined    Emotionally Abused: Patient declined    Physically Abused: Patient declined    Sexually Abused: Patient declined    Outpatient Medications Prior to Visit  Medication Sig Dispense Refill   apixaban  (ELIQUIS ) 5 MG TABS tablet Take 1 tablet (5 mg total) by mouth 2 (two) times daily. 60 tablet 0   cholecalciferol (VITAMIN D) 25 MCG (1000 UNIT) tablet Take 2,000 Units by mouth in the morning.     diltiazem  (CARDIZEM ) 30 MG tablet Take 1 tablet (30 mg total) by mouth every 6 (six) hours as needed (for sustained HR >130 for more than 15 mins. do not take if systolic blood pressure (top number) is lower than 100.). 60 tablet 0   estradiol (ESTRACE) 0.1 MG/GM vaginal cream ESTRADIOL 0.1 MG/GM CREA     folic acid  (FOLVITE ) 1 MG tablet Take 1 mg by mouth in the morning.     furosemide  (LASIX ) 20 MG tablet Take 20 mg by mouth daily.     gabapentin  (NEURONTIN ) 300 MG capsule Take 300 mg by mouth at bedtime.     JARDIANCE  10 MG TABS tablet TAKE 1 TABLET BY MOUTH ONCE DAILY BEFORE BREAKFAST 30 tablet 0   methotrexate  (RHEUMATREX) 2.5 MG tablet Take 20 mg by mouth every Sunday.     metoprolol  tartrate (LOPRESSOR ) 100 MG tablet Take 1 tablet (100 mg total) by mouth 2 (two) times daily. (Patient taking differently: Take 50 mg by mouth 2 (two) times daily.) 60 tablet 0   omeprazole (PRILOSEC) 20 MG capsule Take 20 mg by mouth daily as needed.     polyethylene glycol (MIRALAX  / GLYCOLAX )  17 g packet Take 17 g by mouth daily as needed for moderate constipation. 30 each 0   rosuvastatin (CRESTOR) 20 MG tablet Take 20 mg by mouth daily.     spironolactone  (ALDACTONE ) 25 MG tablet Take 0.5 tablets (12.5 mg total) by mouth daily. 15 tablet 0   SYNTHROID  88 MCG tablet Take 88 mcg by mouth daily before breakfast.     flecainide (TAMBOCOR) 100 MG tablet Take 100 mg by mouth 2 (two) times daily. (Patient not taking: Reported on 06/07/2024)     No facility-administered medications prior to visit.      ROS:  Review of Systems  Constitutional: Negative.   Respiratory: Negative.    Cardiovascular: Negative.   Genitourinary: Negative.      OBJECTIVE:   Vitals:  BP 130/81   Pulse (!) 57   Ht 5' 4 (1.626 m)  Wt 235 lb 12.8 oz (107 kg)   LMP 01/17/2015   BMI 40.47 kg/m   Physical Exam Vitals reviewed.  Constitutional:      Appearance: Normal appearance.  Cardiovascular:     Rate and Rhythm: Normal rate.  Pulmonary:     Effort: Pulmonary effort is normal.  Genitourinary:    Comments: NEFG, tissue intact, pale-consistent with postmenopausal changes. Digital exam with small ridge of tissue, diminished in size from prior exam to right vaginal wall. Skin:    General: Skin is warm and dry.  Neurological:     General: No focal deficit present.     Mental Status: She is alert and oriented to person, place, and time.  Psychiatric:        Mood and Affect: Mood normal.        Behavior: Behavior normal.     Results: No results found for this or any previous visit (from the past 24 hours).   Assessment/Plan: Dyspareunia in female  Post-menopausal atrophic vaginitis  Continue twice weekly estradiol cream. Follow up as needed.  No orders of the defined types were placed in this encounter.    Harlene LITTIE Cisco, CNM 06/07/2024 11:04 AM

## 2024-07-17 ENCOUNTER — Other Ambulatory Visit: Payer: Self-pay | Admitting: Primary Care

## 2024-07-17 DIAGNOSIS — Z780 Asymptomatic menopausal state: Secondary | ICD-10-CM

## 2024-08-09 ENCOUNTER — Other Ambulatory Visit

## 2024-08-23 ENCOUNTER — Ambulatory Visit
Admission: RE | Admit: 2024-08-23 | Discharge: 2024-08-23 | Disposition: A | Discharge status: Home / Self Care | Source: Ambulatory Visit | Attending: Primary Care | Admitting: Primary Care

## 2024-08-23 DIAGNOSIS — Z780 Asymptomatic menopausal state: Secondary | ICD-10-CM | POA: Insufficient documentation

## 2024-11-02 NOTE — Congregational Nurse Program (Signed)
" °  Dept: [phone]   Congregational Nurse Program Note  Date of Encounter: 11/02/2024  Vision screen completed by Randine Mitchell, RN, results: OD -0,50,  OS 0.00  Past Medical History: Past Medical History:  Diagnosis Date   Anxiety    Arrhythmia    PAF   Arthritis    RA   Borderline diabetes    CHF (congestive heart failure) (HCC)    COVID-19 11/2019   Dyspnea    OFF AND ON SINCE COVID 2021   GERD (gastroesophageal reflux disease)    Goiter    Heart murmur    not being followed or treated. has lost weight with improvement of murmur   History of blood transfusion    unsure of when she had transfusion but states she had a terrible reaction and needed medication   Hypertension    Hyperthyroidism    Hypothyroidism    not on meds at this time   Medical history non-contributory    Pneumonia 11/2019   Rheumatoid arthritis (HCC)    Sleep apnea    does not use cpap since weight loss    Encounter Details:  Community Questionnaire - 11/02/24 1906       Questionnaire   Ask client: Do you give verbal consent for me to treat you today? Yes    Student Assistance N/A    Location Patient [employer] Assistant, [employer]    Encounter Setting CN site    Population Status Housed    Engineer, [employer] or [employer]    Insurance/Financial Assistance Referral N/A    Medication N/A    Medical Provider Yes    Medical Referrals Made Vision;NA    Medical Appointment Completed N/A    Screenings CN Performed (remember to also record results) Vision    CNP Interventions Other Education;Advocate/Support    ED Visit Averted N/A            "
